# Patient Record
Sex: Female | Born: 1975 | Race: White | Hispanic: No | Marital: Single | Smoking: Current every day smoker
Health system: Southern US, Community
[De-identification: ages and names within clinical notes are randomized; demographics above are authoritative.]

## PROBLEM LIST (undated history)

## (undated) DIAGNOSIS — Z720 Tobacco use: Secondary | ICD-10-CM

## (undated) DIAGNOSIS — IMO0002 Reserved for concepts with insufficient information to code with codable children: Secondary | ICD-10-CM

## (undated) DIAGNOSIS — F32A Depression, unspecified: Secondary | ICD-10-CM

## (undated) DIAGNOSIS — K750 Abscess of liver: Secondary | ICD-10-CM

## (undated) DIAGNOSIS — I429 Cardiomyopathy, unspecified: Secondary | ICD-10-CM

## (undated) DIAGNOSIS — I10 Essential (primary) hypertension: Secondary | ICD-10-CM

## (undated) DIAGNOSIS — D649 Anemia, unspecified: Secondary | ICD-10-CM

## (undated) DIAGNOSIS — I639 Cerebral infarction, unspecified: Secondary | ICD-10-CM

## (undated) DIAGNOSIS — F329 Major depressive disorder, single episode, unspecified: Secondary | ICD-10-CM

## (undated) HISTORY — DX: Cerebral infarction, unspecified: I63.9

## (undated) HISTORY — PX: APPENDECTOMY: SHX54

## (undated) HISTORY — PX: TONSILLECTOMY: SUR1361

## (undated) HISTORY — PX: CRYOTHERAPY: SHX1416

## (undated) HISTORY — DX: Tobacco use: Z72.0

---
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
## 2011-08-19 ENCOUNTER — Encounter (HOSPITAL_COMMUNITY): Payer: Self-pay

## 2011-08-19 ENCOUNTER — Inpatient Hospital Stay (HOSPITAL_COMMUNITY): Payer: Medicaid Other

## 2011-08-19 ENCOUNTER — Inpatient Hospital Stay (HOSPITAL_COMMUNITY)
Admission: AD | Admit: 2011-08-19 | Discharge: 2011-08-28 | DRG: 775 | Disposition: A | Discharge status: Home / Self Care | Payer: Medicaid Other | Source: Ambulatory Visit | Attending: Obstetrics & Gynecology | Admitting: Obstetrics & Gynecology

## 2011-08-19 DIAGNOSIS — O4690 Antepartum hemorrhage, unspecified, unspecified trimester: Secondary | ICD-10-CM

## 2011-08-19 DIAGNOSIS — O2390 Unspecified genitourinary tract infection in pregnancy, unspecified trimester: Principal | ICD-10-CM | POA: Diagnosis present

## 2011-08-19 DIAGNOSIS — O99814 Abnormal glucose complicating childbirth: Secondary | ICD-10-CM | POA: Diagnosis present

## 2011-08-19 DIAGNOSIS — K750 Abscess of liver: Secondary | ICD-10-CM | POA: Diagnosis present

## 2011-08-19 DIAGNOSIS — E119 Type 2 diabetes mellitus without complications: Secondary | ICD-10-CM | POA: Diagnosis present

## 2011-08-19 DIAGNOSIS — N12 Tubulo-interstitial nephritis, not specified as acute or chronic: Secondary | ICD-10-CM

## 2011-08-19 DIAGNOSIS — O2300 Infections of kidney in pregnancy, unspecified trimester: Secondary | ICD-10-CM | POA: Diagnosis present

## 2011-08-19 DIAGNOSIS — D279 Benign neoplasm of unspecified ovary: Secondary | ICD-10-CM | POA: Diagnosis present

## 2011-08-19 DIAGNOSIS — O34599 Maternal care for other abnormalities of gravid uterus, unspecified trimester: Secondary | ICD-10-CM | POA: Diagnosis present

## 2011-08-19 DIAGNOSIS — N10 Acute tubulo-interstitial nephritis: Secondary | ICD-10-CM | POA: Diagnosis present

## 2011-08-19 DIAGNOSIS — O99892 Other specified diseases and conditions complicating childbirth: Secondary | ICD-10-CM | POA: Diagnosis present

## 2011-08-19 HISTORY — DX: Anemia, unspecified: D64.9

## 2011-08-19 HISTORY — DX: Major depressive disorder, single episode, unspecified: F32.9

## 2011-08-19 HISTORY — DX: Depression, unspecified: F32.A

## 2011-08-19 HISTORY — DX: Reserved for concepts with insufficient information to code with codable children: IMO0002

## 2011-08-19 LAB — URINALYSIS, ROUTINE W REFLEX MICROSCOPIC
Glucose, UA: 100 mg/dL — AB
Ketones, ur: 40 mg/dL — AB
Nitrite: POSITIVE — AB
Protein, ur: 30 mg/dL — AB
pH: 6 (ref 5.0–8.0)

## 2011-08-19 LAB — GLUCOSE, CAPILLARY

## 2011-08-19 LAB — COMPREHENSIVE METABOLIC PANEL
ALT: 8 U/L (ref 0–35)
AST: 9 U/L (ref 0–37)
Albumin: 2.4 g/dL — ABNORMAL LOW (ref 3.5–5.2)
Alkaline Phosphatase: 127 U/L — ABNORMAL HIGH (ref 39–117)
BUN: 4 mg/dL — ABNORMAL LOW (ref 6–23)
Chloride: 99 mEq/L (ref 96–112)
Potassium: 3.5 mEq/L (ref 3.5–5.1)
Sodium: 131 mEq/L — ABNORMAL LOW (ref 135–145)
Total Bilirubin: 0.7 mg/dL (ref 0.3–1.2)
Total Protein: 6 g/dL (ref 6.0–8.3)

## 2011-08-19 LAB — CBC
Hemoglobin: 11.7 g/dL — ABNORMAL LOW (ref 12.0–15.0)
MCH: 28.1 pg (ref 26.0–34.0)
MCHC: 33.7 g/dL (ref 30.0–36.0)
MCV: 83.4 fL (ref 78.0–100.0)
RBC: 4.16 MIL/uL (ref 3.87–5.11)

## 2011-08-19 LAB — WET PREP, GENITAL
Clue Cells Wet Prep HPF POC: NONE SEEN
Trich, Wet Prep: NONE SEEN

## 2011-08-19 LAB — URINE MICROSCOPIC-ADD ON

## 2011-08-19 MED ORDER — ZOLPIDEM TARTRATE 10 MG PO TABS: 10.0000 mg | ORAL_TABLET | Freq: Every evening | ORAL | Status: DC | PRN

## 2011-08-19 MED ORDER — SODIUM CHLORIDE 0.9 % IV SOLN
INTRAVENOUS | Status: DC
  Administered 2011-08-19 – 2011-08-21 (×7): via INTRAVENOUS
  Administered 2011-08-22: 125 mL via INTRAVENOUS
  Administered 2011-08-22 (×2): via INTRAVENOUS

## 2011-08-19 MED ORDER — CALCIUM CARBONATE ANTACID 500 MG PO CHEW
2.0000 | CHEWABLE_TABLET | ORAL | Status: DC | PRN
  Administered 2011-08-21 (×3): 400 mg via ORAL
  Filled 2011-08-19 (×2): qty 1
  Filled 2011-08-19 (×2): qty 2

## 2011-08-19 MED ORDER — GLYBURIDE 5 MG PO TABS: 5.0000 mg | ORAL_TABLET | Freq: Every day | ORAL | Status: DC

## 2011-08-19 MED ORDER — DEXTROSE 5 % IV SOLN
1.0000 g | Freq: Two times a day (BID) | INTRAVENOUS | Status: DC
  Administered 2011-08-19 – 2011-08-24 (×10): 1 g via INTRAVENOUS
  Filled 2011-08-19 (×11): qty 1

## 2011-08-19 MED ORDER — GLYBURIDE 2.5 MG PO TABS
2.5000 mg | ORAL_TABLET | Freq: Two times a day (BID) | ORAL | Status: DC
  Administered 2011-08-19: 2.5 mg via ORAL
  Filled 2011-08-19 (×2): qty 1

## 2011-08-19 MED ORDER — ACETAMINOPHEN 325 MG PO TABS: 650.0000 mg | ORAL_TABLET | ORAL | Status: DC | PRN

## 2011-08-19 MED ORDER — SODIUM CHLORIDE 0.9 % IV SOLN
Freq: Once | INTRAVENOUS | Status: AC
  Administered 2011-08-19: 12:00:00 via INTRAVENOUS

## 2011-08-19 MED ORDER — DOCUSATE SODIUM 100 MG PO CAPS
100.0000 mg | ORAL_CAPSULE | Freq: Every day | ORAL | Status: DC
  Administered 2011-08-21 – 2011-08-28 (×7): 100 mg via ORAL
  Filled 2011-08-19 (×7): qty 1

## 2011-08-19 MED ORDER — PRENATAL PLUS 27-1 MG PO TABS
1.0000 | ORAL_TABLET | Freq: Every day | ORAL | Status: DC
  Administered 2011-08-21 – 2011-08-25 (×3): 1 via ORAL
  Filled 2011-08-19 (×4): qty 1

## 2011-08-19 MED ORDER — NIFEDIPINE 10 MG PO CAPS
20.0000 mg | ORAL_CAPSULE | Freq: Once | ORAL | Status: AC
  Administered 2011-08-19: 20 mg via ORAL
  Filled 2011-08-19: qty 2

## 2011-08-19 NOTE — Progress Notes (Signed)
  Having irregular and mild contractions q 4-8 minutes.  CX LTC, pp not palpable.  Most likely due to pyelonephritis, but will treat with a dose of procardia.

## 2011-08-19 NOTE — ED Provider Notes (Signed)
History     Chief Complaint  Patient presents with  . Emesis   Patient is a 35 y.o. female presenting with vomiting. The history is provided by the patient and the spouse.  Emesis  This is a new problem. The current episode started more than 2 days ago. The problem has been gradually worsening. The emesis has an appearance of stomach contents. Associated symptoms include chills and a fever. Pertinent negatives include no abdominal pain, no cough, no diarrhea, no headaches and no myalgias.    Past Medical History  Diagnosis Date  . Diabetes mellitus   . Anemia   . Depression   . Migraine   . Abnormal Pap smear     Past Surgical History  Procedure Date  . Tonsillectomy   . Cryotherapy     No family history on file.  History  Substance Use Topics  . Smoking status: Current Everyday Smoker -- 1.0 packs/day  . Smokeless tobacco: Not on file  . Alcohol Use: No    Allergies: No Known Allergies  Prescriptions prior to admission  Medication Sig Dispense Refill  . glyBURIDE (DIABETA) 2.5 MG tablet Take 5 mg by mouth daily with breakfast.        . prenatal vitamin w/FE, FA (PRENATAL 1 + 1) 27-1 MG TABS Take 1 tablet by mouth daily.          Review of Systems  Constitutional: Positive for fever, chills, malaise/fatigue and diaphoresis.  Eyes: Negative for blurred vision and double vision.  Respiratory: Negative for cough and wheezing.   Cardiovascular: Negative for chest pain.  Gastrointestinal: Positive for nausea and vomiting. Negative for heartburn, abdominal pain and diarrhea.  Genitourinary: Negative for dysuria, urgency and frequency.       Thinks she might have some incontinence  Musculoskeletal: Negative for myalgias.  Skin: Negative for rash.  Neurological: Negative for dizziness, tingling and headaches.  Psychiatric/Behavioral: Negative.    Physical Exam   Blood pressure 138/69, pulse 131, temperature 99.5 F (37.5 C), temperature source Oral, resp. rate 18,  height 5\' 6"  (1.676 m), weight 233 lb 12.8 oz (106.051 kg), SpO2 100.00%.  Physical Exam  Constitutional: She is oriented to person, place, and time. She appears well-developed and well-nourished. She appears ill.  HENT:  Head: Normocephalic.  Mouth/Throat: Oropharynx is clear and moist.       Sweating, facial hair noted  Eyes: Pupils are equal, round, and reactive to light.  Neck: Normal range of motion.  Cardiovascular: Normal heart sounds.  Tachycardia present.   No murmur heard. Respiratory: Effort normal and breath sounds normal. No respiratory distress.  GI: Soft. Bowel sounds are normal. There is no tenderness. There is no rebound and no guarding.  Genitourinary: Vaginal discharge found.  Musculoskeletal: Normal range of motion.  Neurological: She is alert and oriented to person, place, and time. No cranial nerve deficit.  Skin: She is diaphoretic.       Skin hot to the touch and moist with sweat/diaphoresis  Psychiatric: She has a normal mood and affect.    MAU Course  Procedures [place]: Does not show oligo  A/P: Pyleo, will admit. See H&P   Addasyn Mcbreen N 08/19/2011, 12:44 PM

## 2011-08-19 NOTE — Progress Notes (Signed)
Pt states she has been receiving prenatal care in [place] and has recently moved to [facility]. [facility]. Pt states she has had nausea and vomiting for 4 days, getting worse and not keeping anything down. Has had headaches and stiff neck, chills and fever like symptoms. No leaking, bleeding or contractions. Fetal movement has slowed down but continues to move.

## 2011-08-19 NOTE — H&P (Signed)
Maternal Medical History:  Reason for admission: Patient is 34-yo [id number] who presents at 33.[redacted] wks EGA with 3-day history of fevers and chills, and 4 day history of nausea and vomiting. She states she received her [facility] at a clinic in [place], and has recently moved to [place]. She has an appointment next week to be seen in [facility]. She has Type 2 DM, which is diet controlled but since pregnancy has been placed on Glyburide 2.5mg  BID. She states she does not have any dysuria but is having some feeling of incontinence. She denies any vaginal bleeding or contractions, denies back/flank pain, and reports good fetal movement. During this pregnancy, she has had some vaginal bleeding which stopped over one month ago and besides GDM/B she has had no complications. Spouse has [place] from early in July that read positive for oligiohydramnios and she was supposed to have repeat on Aug 24 but was moving to [place] at that time.   Emesis  This is a new problem. The current episode started more than 2 days ago. The problem has been gradually worsening. The emesis has an appearance of stomach contents. Associated symptoms include chills and a fever. Pertinent negatives include no abdominal pain, no cough, no diarrhea, no headaches and no myalgias.   History of 3 term vaginal deliveries.  Past Medical History   Diagnosis  Date   .  Diabetes mellitus    .  Anemia    .  Depression    .  Migraine    .  Abnormal Pap smear     Past Surgical History   Procedure  Date   .  Tonsillectomy    .  Cryotherapy     No family history on file.  History   Substance Use Topics   .  Smoking status:  Current Everyday Smoker -- 1.0 packs/day   .  Smokeless tobacco:  Not on file   .  Alcohol Use:  No    Allergies: No Known Allergies  Prescriptions prior to admission   Medication  Sig  Dispense  Refill   .  glyBURIDE (DIABETA) 2.5 MG tablet  Take 5 mg by mouth daily with breakfast.     .  prenatal vitamin w/FE, FA (PRENATAL 1 + 1)  27-1 MG TABS  Take 1 tablet by mouth daily.      Review of Systems  Constitutional: Positive for fever, chills, malaise/fatigue and diaphoresis.  Eyes: Negative for blurred vision and double vision.  Respiratory: Negative for cough and wheezing.  Cardiovascular: Negative for chest pain.  Gastrointestinal: Positive for nausea and vomiting. Negative for heartburn, abdominal pain and diarrhea.  Genitourinary: Negative for dysuria, urgency and frequency.  Thinks she might have some incontinence  Musculoskeletal: Negative for myalgias.  Skin: Negative for rash.  Neurological: Negative for dizziness, tingling and headaches.  Psychiatric/Behavioral: Negative.   Physical Exam   Blood pressure 138/69, pulse 131, temperature 99.5 F (37.5 C), temperature source Oral, resp. rate 18, height 5\' 6"  (1.676 m), weight 233 lb 12.8 oz (106.051 kg), SpO2 100.00%.  Physical Exam  Constitutional: She is oriented to person, place, and time. She appears well-developed and well-nourished. She appears ill.  HENT:  Head: Normocephalic.  Mouth/Throat: Oropharynx is clear and moist.  Sweating, facial hair noted  Eyes: Pupils are equal, round, and reactive to light.  Neck: Normal range of motion.  Cardiovascular: Normal heart sounds. Tachycardia present.  No murmur heard.  Respiratory: Effort normal and breath sounds normal. No respiratory distress.  GI: Soft. Bowel sounds are normal. There is no tenderness. There is no rebound and no guarding.  Genitourinary: Vaginal discharge found.  Musculoskeletal: Normal range of motion.  Neurological: She is alert and oriented to person, place, and time. No cranial nerve deficit.  Skin: She is diaphoretic.  Skin hot to the touch and moist with sweat/diaphoresis  Psychiatric: She has a normal mood and affect.   MAU Course   Results for orders placed during the hospital encounter of 08/19/11 (from the past 24 hour(s))  URINALYSIS, ROUTINE W REFLEX MICROSCOPIC      Status: Abnormal   Collection Time   08/19/11 11:20 AM      Component Value Range   Color, Urine AMBER (*) YELLOW    Appearance CLEAR  CLEAR    Specific Gravity, Urine 1.020  1.005 - 1.030    pH 6.0  5.0 - 8.0    Glucose, UA 100 (*) NEGATIVE (mg/dL)   Hgb urine dipstick NEGATIVE  NEGATIVE    Bilirubin Urine SMALL (*) NEGATIVE    Ketones, ur 40 (*) NEGATIVE (mg/dL)   Protein, ur 30 (*) NEGATIVE (mg/dL)   Urobilinogen, UA 4.0 (*) 0.0 - 1.0 (mg/dL)   Nitrite POSITIVE (*) NEGATIVE    Leukocytes, UA SMALL (*) NEGATIVE   URINE MICROSCOPIC-ADD ON     Status: Abnormal   Collection Time   08/19/11 11:20 AM      Component Value Range   Squamous Epithelial / LPF FEW (*) RARE    WBC, UA 3-6  <3 (WBC/hpf)   RBC / HPF 0-2  <3 (RBC/hpf)   Bacteria, UA RARE  RARE    Urine-Other MUCOUS PRESENT    CBC     Status: Abnormal   Collection Time   08/19/11 12:15 PM      Component Value Range   WBC 29.2 (*) 4.0 - 10.5 (K/uL)   RBC 4.16  3.87 - 5.11 (MIL/uL)   Hemoglobin 11.7 (*) 12.0 - 15.0 (g/dL)   HCT 40.9 (*) 81.1 - 46.0 (%)   MCV 83.4  78.0 - 100.0 (fL)   MCH 28.1  26.0 - 34.0 (pg)   MCHC 33.7  30.0 - 36.0 (g/dL)   RDW 91.4  78.2 - 95.6 (%)   Platelets 259  150 - 400 (K/uL)  COMPREHENSIVE METABOLIC PANEL     Status: Abnormal   Collection Time   08/19/11 12:15 PM      Component Value Range   Sodium 131 (*) 135 - 145 (mEq/L)   Potassium 3.5  3.5 - 5.1 (mEq/L)   Chloride 99  96 - 112 (mEq/L)   CO2 21  19 - 32 (mEq/L)   Glucose, Bld 138 (*) 70 - 99 (mg/dL)   BUN 4 (*) 6 - 23 (mg/dL)   Creatinine, Ser <2.13 (*) 0.50 - 1.10 (mg/dL)   Calcium 8.9  8.4 - 08.6 (mg/dL)   Total Protein 6.0  6.0 - 8.3 (g/dL)   Albumin 2.4 (*) 3.5 - 5.2 (g/dL)   AST 9  0 - 37 (U/L)   ALT 8  0 - 35 (U/L)   Alkaline Phosphatase 127 (*) 39 - 117 (U/L)   Total Bilirubin 0.7  0.3 - 1.2 (mg/dL)   GFR calc non Af Amer NOT CALCULATED  >60 (mL/min)   GFR calc Af Amer NOT CALCULATED  >60 (mL/min)  WET PREP, GENITAL      Status: Abnormal   Collection Time   08/19/11 12:20 PM  Component Value Range   Yeast, Wet Prep NONE SEEN  NONE SEEN    Trich, Wet Prep NONE SEEN  NONE SEEN    Clue Cells, Wet Prep NONE SEEN  NONE SEEN    WBC, Wet Prep HPF POC MANY (*) NONE SEEN    ssAssessment and Plan      Family History: family history is not on file. Social History:  reports that she has been smoking.  She does not have any smokeless tobacco history on file. She reports that she does not drink alcohol or use illicit drugs.  Review of Systems  Constitutional: Positive for fever and chills.    Dilation: Closed Effacement (%): Thick Exam by:: Dr Natale Milch Blood pressure 138/69, pulse 131, temperature 98.9 F (37.2 C), temperature source Oral, resp. rate 18, height 5\' 6"  (1.676 m), weight 233 lb 12.8 oz (106.051 kg), SpO2 100.00%. Examsee above Physical Exam see above Prenatal labs: ABO, Rh:   Antibody:   Rubella:   RPR:    HBsAg:    HIV:    GBS:     Assessment/Plan: Acute Pyleonephritis 1. Rocephin 1 gram IV q12 2. Tylenol for fevers 3. Continuous Fetal monitoring 4. Repeat CBC in AM  GDM/B 1. Continue glyuburide 2.5mg  BID 2. Monitor blood glucose and treat as appropriate.  Chancy Milroy 08/19/2011, 2:54 PM

## 2011-08-20 ENCOUNTER — Inpatient Hospital Stay (HOSPITAL_COMMUNITY): Payer: Medicaid Other

## 2011-08-20 DIAGNOSIS — O24919 Unspecified diabetes mellitus in pregnancy, unspecified trimester: Secondary | ICD-10-CM

## 2011-08-20 DIAGNOSIS — D279 Benign neoplasm of unspecified ovary: Secondary | ICD-10-CM | POA: Diagnosis present

## 2011-08-20 DIAGNOSIS — O2300 Infections of kidney in pregnancy, unspecified trimester: Secondary | ICD-10-CM | POA: Diagnosis present

## 2011-08-20 DIAGNOSIS — K750 Abscess of liver: Secondary | ICD-10-CM | POA: Diagnosis present

## 2011-08-20 HISTORY — DX: Benign neoplasm of unspecified ovary: D27.9

## 2011-08-20 LAB — GLUCOSE, CAPILLARY
Glucose-Capillary: 136 mg/dL — ABNORMAL HIGH (ref 70–99)
Glucose-Capillary: 83 mg/dL (ref 70–99)

## 2011-08-20 LAB — CBC
MCH: 27.6 pg (ref 26.0–34.0)
MCHC: 32.5 g/dL (ref 30.0–36.0)
Platelets: 267 10*3/uL (ref 150–400)

## 2011-08-20 LAB — GC/CHLAMYDIA PROBE AMP, GENITAL: GC Probe Amp, Genital: NEGATIVE

## 2011-08-20 MED ORDER — NIFEDIPINE 10 MG PO CAPS
20.0000 mg | ORAL_CAPSULE | Freq: Once | ORAL | Status: AC
  Administered 2011-08-20: 20 mg via ORAL
  Filled 2011-08-20: qty 2

## 2011-08-20 MED ORDER — PANTOPRAZOLE SODIUM 40 MG PO TBEC
40.0000 mg | DELAYED_RELEASE_TABLET | ORAL | Status: DC
  Administered 2011-08-20 – 2011-08-28 (×7): 40 mg via ORAL
  Filled 2011-08-20 (×10): qty 1

## 2011-08-20 MED ORDER — ONDANSETRON HCL 4 MG/2ML IJ SOLN
4.0000 mg | Freq: Once | INTRAMUSCULAR | Status: AC
  Administered 2011-08-20: 4 mg via INTRAVENOUS
  Filled 2011-08-20: qty 2

## 2011-08-20 MED ORDER — PROMETHAZINE HCL 25 MG/ML IJ SOLN
25.0000 mg | Freq: Four times a day (QID) | INTRAMUSCULAR | Status: DC | PRN
  Administered 2011-08-20 – 2011-08-21 (×2): 25 mg via INTRAVENOUS
  Filled 2011-08-20 (×4): qty 1

## 2011-08-20 MED ORDER — PANTOPRAZOLE SODIUM 40 MG PO TBEC: 40.0000 mg | DELAYED_RELEASE_TABLET | Freq: Every day | ORAL | Status: DC

## 2011-08-20 MED ORDER — IOHEXOL 300 MG/ML  SOLN
100.0000 mL | Freq: Once | INTRAMUSCULAR | Status: AC | PRN
  Administered 2011-08-20: 100 mL via INTRAVENOUS

## 2011-08-20 MED ORDER — ONDANSETRON HCL 4 MG/2ML IJ SOLN
4.0000 mg | Freq: Four times a day (QID) | INTRAMUSCULAR | Status: DC | PRN
  Administered 2011-08-20 – 2011-08-22 (×5): 4 mg via INTRAVENOUS
  Filled 2011-08-20 (×5): qty 2

## 2011-08-20 MED ORDER — ACETAMINOPHEN 650 MG RE SUPP
650.0000 mg | RECTAL | Status: DC | PRN
  Administered 2011-08-20: 650 mg via RECTAL

## 2011-08-20 MED ORDER — GLYBURIDE 5 MG PO TABS
5.0000 mg | ORAL_TABLET | Freq: Two times a day (BID) | ORAL | Status: DC
  Administered 2011-08-20 – 2011-08-26 (×11): 5 mg via ORAL
  Filled 2011-08-20 (×15): qty 1

## 2011-08-20 NOTE — Progress Notes (Signed)
  Patient c/o fevers and worsening nausea/emesis, poor appetite. +FM, no LOF, no VB, no CTX  VSS Tm 103  PE: Abd: s/gravid/NT Back: no CVA tenderness Ext: NT, equal in size  FHT: baseline 190's ,mod variability, no accels, no decels Toco: rare ctx  A/P 35 yo G5P3013 @ [redacted]w[redacted]d with fever and poor appetite. - will obtain CT to r/o appendicitis - tylenol for fever - cont close monitoring

## 2011-08-20 NOTE — Progress Notes (Addendum)
FACULTY PRACTICE ANTEPARTUM(COMPREHENSIVE) NOTE  [name] is a 35 y.o. [id number] at [redacted]w[redacted]d  who is admitted for pyelonephritis.   Fetal presentation is cephalic. Length of Stay:  1  Days  Subjective: Patient complains of  Patient reports the fetal movement as active. Patient reports uterine contraction  activity as none now, had some last night, got 1 dose of procardia. Patient reports  vaginal bleeding as none. Patient describes fluid per vagina as None.  Vitals:  Blood pressure 127/59, pulse 114, temperature 98.4 F (36.9 C), temperature source Oral, resp. rate 22, height 5\' 6"  (1.676 m), weight 233 lb 12.8 oz (106.051 kg), SpO2 98.00%. Physical Examination:  General appearance - alert, well appearing, and in no distress Heart - normal rate, regular rhythm, normal S1, S2, no murmurs, rubs, clicks or gallops Back exam - minimal Left CVAT Fundal Height:  size equals dates Pelvic Exam:  examination not indicated Cervical Exam: Not evaluated. and found to be not evaluated/ Extremities: extremities normal, atraumatic, no cyanosis or edema with DTRs 2+ bilaterally Membranes:intact  Fetal Monitoring:  Baseline: 150 bpm, Variability: Good {> 6 bpm) and Accelerations: Reactive  Labs:  Recent Results (from the past 24 hour(s))  URINALYSIS, ROUTINE W REFLEX MICROSCOPIC   Collection Time   08/19/11 11:20 AM      Component Value Range   Color, Urine AMBER (*) YELLOW    Appearance CLEAR  CLEAR    Specific Gravity, Urine 1.020  1.005 - 1.030    pH 6.0  5.0 - 8.0    Glucose, UA 100 (*) NEGATIVE (mg/dL)   Hgb urine dipstick NEGATIVE  NEGATIVE    Bilirubin Urine SMALL (*) NEGATIVE    Ketones, ur 40 (*) NEGATIVE (mg/dL)   Protein, ur 30 (*) NEGATIVE (mg/dL)   Urobilinogen, UA 4.0 (*) 0.0 - 1.0 (mg/dL)   Nitrite POSITIVE (*) NEGATIVE    Leukocytes, UA SMALL (*) NEGATIVE   URINE MICROSCOPIC-ADD ON   Collection Time   08/19/11 11:20 AM      Component Value Range   Squamous Epithelial /  LPF FEW (*) RARE    WBC, UA 3-6  <3 (WBC/hpf)   RBC / HPF 0-2  <3 (RBC/hpf)   Bacteria, UA RARE  RARE    Urine-Other MUCOUS PRESENT    CBC   Collection Time   08/19/11 12:15 PM      Component Value Range   WBC 29.2 (*) 4.0 - 10.5 (K/uL)   RBC 4.16  3.87 - 5.11 (MIL/uL)   Hemoglobin 11.7 (*) 12.0 - 15.0 (g/dL)   HCT 62.1 (*) 30.8 - 46.0 (%)   MCV 83.4  78.0 - 100.0 (fL)   MCH 28.1  26.0 - 34.0 (pg)   MCHC 33.7  30.0 - 36.0 (g/dL)   RDW 65.7  84.6 - 96.2 (%)   Platelets 259  150 - 400 (K/uL)  COMPREHENSIVE METABOLIC PANEL   Collection Time   08/19/11 12:15 PM      Component Value Range   Sodium 131 (*) 135 - 145 (mEq/L)   Potassium 3.5  3.5 - 5.1 (mEq/L)   Chloride 99  96 - 112 (mEq/L)   CO2 21  19 - 32 (mEq/L)   Glucose, Bld 138 (*) 70 - 99 (mg/dL)   BUN 4 (*) 6 - 23 (mg/dL)   Creatinine, Ser <9.52 (*) 0.50 - 1.10 (mg/dL)   Calcium 8.9  8.4 - 84.1 (mg/dL)   Total Protein 6.0  6.0 - 8.3 (g/dL)  Albumin 2.4 (*) 3.5 - 5.2 (g/dL)   AST 9  0 - 37 (U/L)   ALT 8  0 - 35 (U/L)   Alkaline Phosphatase 127 (*) 39 - 117 (U/L)   Total Bilirubin 0.7  0.3 - 1.2 (mg/dL)   GFR calc non Af Amer NOT CALCULATED  >60 (mL/min)   GFR calc Af Amer NOT CALCULATED  >60 (mL/min)  GC/CHLAMYDIA PROBE AMP, GENITAL   Collection Time   08/19/11 12:20 PM      Component Value Range   GC Probe Amp, Genital NEGATIVE  NEGATIVE    Chlamydia, DNA Probe NEGATIVE  NEGATIVE   WET PREP, GENITAL   Collection Time   08/19/11 12:20 PM      Component Value Range   Yeast, Wet Prep NONE SEEN  NONE SEEN    Trich, Wet Prep NONE SEEN  NONE SEEN    Clue Cells, Wet Prep NONE SEEN  NONE SEEN    WBC, Wet Prep HPF POC MANY (*) NONE SEEN   GLUCOSE, CAPILLARY   Collection Time   08/19/11  7:27 PM      Component Value Range   Glucose-Capillary 117 (*) 70 - 99 (mg/dL)   Comment 1 Notify RN     Comment 2 Documented in Chart    CBC   Collection Time   08/20/11  5:15 AM      Component Value Range   WBC 26.4 (*) 4.0 - 10.5  (K/uL)   RBC 3.84 (*) 3.87 - 5.11 (MIL/uL)   Hemoglobin 10.6 (*) 12.0 - 15.0 (g/dL)   HCT 16.1 (*) 09.6 - 46.0 (%)   MCV 84.9  78.0 - 100.0 (fL)   MCH 27.6  26.0 - 34.0 (pg)   MCHC 32.5  30.0 - 36.0 (g/dL)   RDW 04.5  40.9 - 81.1 (%)   Platelets 267  150 - 400 (K/uL)  GLUCOSE, CAPILLARY   Collection Time   08/20/11  7:19 AM      Component Value Range   Glucose-Capillary 136 (*) 70 - 99 (mg/dL)    Imaging Studies  :      Medications:  Scheduled    . sodium chloride   Intravenous Once  . cefTRIAXone (ROCEPHIN) IV  1 g Intravenous Q12H  . docusate sodium  100 mg Oral Daily  . glyBURIDE  2.5 mg Oral BID AC  . NIFEdipine  20 mg Oral Once  . ondansetron  4 mg Intravenous Once  . prenatal vitamin w/FE, FA  1 tablet Oral Daily  . DISCONTD: glyBURIDE  5 mg Oral Q breakfast   I have reviewed the patient's current medications.  ASSESSMENT: 1.  Pyelonephritis @ 33 weeks 5 days, responding to rocephin 2.  Class B diabetes, sub optimal control  PLAN: Continue Rocephin.  Increase Glyburide to 5 mg po BID.  Begin Prilosec for reflux.  EURE,LUTHER H 08/20/2011,7:24 AM

## 2011-08-21 ENCOUNTER — Inpatient Hospital Stay (HOSPITAL_COMMUNITY): Payer: Medicaid Other

## 2011-08-21 ENCOUNTER — Other Ambulatory Visit: Payer: Self-pay | Admitting: Internal Medicine

## 2011-08-21 DIAGNOSIS — K750 Abscess of liver: Secondary | ICD-10-CM

## 2011-08-21 LAB — URINE CULTURE: Colony Count: 70000

## 2011-08-21 LAB — GLUCOSE, CAPILLARY
Glucose-Capillary: 119 mg/dL — ABNORMAL HIGH (ref 70–99)
Glucose-Capillary: 71 mg/dL (ref 70–99)
Glucose-Capillary: 86 mg/dL (ref 70–99)
Glucose-Capillary: 91 mg/dL (ref 70–99)
Glucose-Capillary: 98 mg/dL (ref 70–99)

## 2011-08-21 LAB — RAPID URINE DRUG SCREEN, HOSP PERFORMED
Cocaine: NOT DETECTED
Opiates: NOT DETECTED
Tetrahydrocannabinol: NOT DETECTED

## 2011-08-21 LAB — CBC
HCT: 35.4 % — ABNORMAL LOW (ref 36.0–46.0)
Hemoglobin: 11.6 g/dL — ABNORMAL LOW (ref 12.0–15.0)
RDW: 14.7 % (ref 11.5–15.5)
WBC: 25.9 10*3/uL — ABNORMAL HIGH (ref 4.0–10.5)

## 2011-08-21 MED ORDER — POLYETHYLENE GLYCOL 3350 17 G PO PACK
17.0000 g | PACK | Freq: Every day | ORAL | Status: DC | PRN
  Administered 2011-08-22: 17 g via ORAL
  Filled 2011-08-21: qty 1

## 2011-08-21 MED ORDER — BISACODYL 10 MG RE SUPP
10.0000 mg | Freq: Every day | RECTAL | Status: DC | PRN
  Filled 2011-08-21: qty 1

## 2011-08-21 MED ORDER — NIFEDIPINE 10 MG PO CAPS
20.0000 mg | ORAL_CAPSULE | Freq: Once | ORAL | Status: AC
  Administered 2011-08-21: 20 mg via ORAL
  Filled 2011-08-21: qty 2

## 2011-08-21 MED ORDER — MORPHINE SULFATE 4 MG/ML IJ SOLN
2.0000 mg | Freq: Once | INTRAMUSCULAR | Status: AC
  Administered 2011-08-21: 2 mg via INTRAVENOUS
  Filled 2011-08-21: qty 1

## 2011-08-21 MED ORDER — ACETAMINOPHEN 500 MG PO TABS
1000.0000 mg | ORAL_TABLET | Freq: Four times a day (QID) | ORAL | Status: DC | PRN
  Administered 2011-08-21 – 2011-08-25 (×4): 1000 mg via ORAL
  Filled 2011-08-21 (×3): qty 2

## 2011-08-21 NOTE — Progress Notes (Signed)
Admitted at  [redacted] weeks gestation, with nausea, hepatic abscess.  Height  66" Weight 233 lbs  pre-pregnancy weight 260 lbs.Pre-pregnancy  BMI 42/ morbid obesity  IBW 130 lbs  Total weight gain none, pt with a 27 lb weight loss. Weight gain goals11-20 lbs.   Estimated needs: 22-2400 kcal/day, 81-91 grams protein/day, 2.6 liters fluid/day  Carbohydrate modified gestational  diet  With improving tolerance, less nausea, appetite poor. Pt. Relays Hx of morning sickness and weight loss early in pregnancy, and recent hx of N/V, continued weight loss. Pt is with a 10 % loss of usual weight, putting her at nutrition risk  CBG's 98-119, on Diabeta   Nutrition Dx: Increased nutrient needs r/t pregnancy, Hx weight loss  and fetal growth requirements aeb [redacted] weeks gestation and 27 lb weight loss  Pt has not had education on gestational diabetic diet. Has had education on Diabetic diet for DM II. Briefly explained differences between diets and gave pt a copy of "My food plan for gestational diabetes", as well as handout on snacks.

## 2011-08-21 NOTE — Progress Notes (Signed)
S: Called to evaluate patient b/c she reported feeling not well suddenly, and then developed sharp lower RT flank/side pain. She denied feeling nauseated at that time. Reports last BM several days ago, received first dose of colace today. Has had continued decrease in appetite. Has not been seen by ID yet.  O: Filed Vitals:   08/21/11 1527  BP:   Pulse:   Temp: 99.5 F (37.5 C)  Resp:    CBG: 91  General: Anxious appearing, breathing held then hard Heart: Regular, no tacycardia, no murmur Lungs: CTA all fields anterior and posteriorly Abd: +BS, no contractions palpated. Point tenderness along lower 2-3 ribs along the axillary angle Fetal Heart Tracing: 150-155, moderate variability, +Accels, no decels, no contractions seen on toco  A/p 1. Rt Pain: ? Pyelo vs liver abnormality seen. May also be related to location of fetal/toco straps. Will d/c strap x 30 minutes. Try therapy with IV morphine for pain control. Do not feel that related to lungs b/c pulse ox 100%, lung fields all clear, and no tachycardia; point tenderness along rib angle 2. Febrile illness: awaiting ID eval/input at this time.

## 2011-08-21 NOTE — Consult Note (Addendum)
Reason: liver mass  HPI: 35 yo female with type 2 diabetes here with fever, n/v and chills about 3 days prior to presentation.  She just recently moved back to [place] from [place].  She also today is having some right flank pain that she previously didn't have.  She is 33 weeks G5P3.  Work up revealed + nitrites on UA but just a few WBCs and few bacteria.  CT scan was done to evaluate and noted findings concerning for pyelonephritis and incidentally a region of the right hepatic lobe concerning for abscess.  Ultrasound was then done and actually suggests it is of benign nature and not an abscess.  She continues to have fever and chills but WBC has improved modestly.  No history of any liver problems.    BMW:[id number], anemia, depression, migraine, tonsillectomy  Medications: Rocephin  Allergies NKDA  SH: she denies travel outside of [place], has only been to [place] recently and no other local travel.  No well water.   FH: no liver disease  ROS: 12 point ROS negative except as per the HPI  Tm 103.1, pulse 109, BP 107/62 Gen - AAO x 3, mildly anxious with pain HEENT - anicteric CV RRR Lungs CTA ABD gravid, nt, +BS EXT no C/C/E Labs: reviewed, WBC improving, now 25.9, creat < 0.47 CT with ?abscess, U/S with ? Hemangioma  A/P  Pyelonephritis - diagnosis of pyelo on CT scan, urine culture unhelpful, no positive blood cultures.  Continue with the Rocephin.   Liver mass - this is unclear.  On ultrasound, it suggests that it is benign but there is no previous scans to compare to.  She tells me she has not had any ultrasounds or CTs in [place].  An MRI would help determine the mass, if possible with her pregnancy.  If it is an abscess, then it would need to be drained.   If unable to do MRI, she may need to continue with the Rocephin and rescan with a new ultrasound in a few days to see if the liver mass is changing (suggesting infection) or not. Will continue to follow

## 2011-08-21 NOTE — Progress Notes (Signed)
Less nausea this AM, no current F&C, good FM  Filed Vitals:   08/21/11 0500 08/21/11 0527 08/21/11 0548 08/21/11 0638  BP:      Pulse:      Temp:      TempSrc:      Resp: 18 22 22 18   Height:      Weight:      SpO2:       NAD, pleasant, tired  Abd NT, gravid, soft Ext no edema  *RADIOLOGY REPORT*  Clinical Data: Right lower quadrant pain, fever. Concern for  appendicitis. [redacted] weeks pregnant. Consent form signed after the  patient discussed the risks and benefits of the examination with  Dr. Kyung Rudd.  CT ABDOMEN AND PELVIS WITH CONTRAST  Technique: Multidetector CT imaging of the abdomen and pelvis was  performed following the standard protocol during bolus  administration of intravenous contrast.  Contrast: 100 ml Omnipaque 300 IV contrast  Comparison: Renal ultrasound 08/19/2011, OB ultrasound 08/19/2011  Findings: Minimal dependent bibasilar atelectasis is noted at the  lung bases.  The appendix measures 7 mm, with gas is visualized within its tip.  No periappendiceal stranding is seen. Again noted is an 8.2 x 5.5  cm mixed density right adnexal mass, with element of internal fat,  compatible with a dermoid.  There is sludge within the gallbladder but no gallbladder  distention, surrounding fluid, stranding or evidence for wall  thickening is identified. In the posterior segment right hepatic  lobe is a 6.7 x 5.7 cm ill-defined hypodense lesion, image 22. No  intrahepatic ductal dilatation is identified. No other hepatic  abnormality is seen. 1.8 cm right mid renal cortical hypodense  lesion measuring 22 HU is identified on image 47. There is mild  fullness of the renal collecting systems bilaterally but within  normal limits for the patient's gravid state. No radiopaque renal  or ureteral calculus.  The spleen, adrenal glands, and pancreas are normal. Common duct  is normal in caliber. No abdominal ascites or lymphadenopathy.  Colon and small bowel are normal. Single  intrauterine gestation is  identified. No gross uterine abnormality is identified, although  the examination was not performed for detection of fetal anatomy or  placental complication, for example. The bladder is unremarkable  for degree of collapse. No acute osseous finding.  IMPRESSION:  Ill-defined posterior segment right hepatic lobe lesion, for which  primary differential considerations include abscess, laceration if  there is a history of trauma, less likely hemangioma or other  benign mass lesion. There is no obvious gastrointestinal  abnormality identified apart from gallstones or sludge without  other evidence for cholecystitis to account for this appearance.  The appendix is normal.  Ill-defined right mid renal cortical hypodense lesion which  measures higher density than that expected for fluid within a cyst.  This is new since the prior exam and could represent focal  pyelonephritis in the setting of urinary tract infection, with  ascending infection. This could also potentially explain the  finding of a hepatic abscess if there has been hematogenous spread  of infection.  Abdominal ultrasound could be considered for further  characterization of the hepatic mass.  These results were called by telephone on 08/20/2011 at 8:30 p.m.  to Dr. Debroah Loop, who verbally acknowledged these results.  Original Report Authenticated By: Harrel Lemon, M.D.        Results for orders placed during the hospital encounter of 08/19/11 (from the past 24 hour(s))  GLUCOSE, CAPILLARY  Status: Abnormal   Collection Time   08/20/11  7:19 AM      Component Value Range   Glucose-Capillary 136 (*) 70 - 99 (mg/dL)  GLUCOSE, CAPILLARY     Status: Abnormal   Collection Time   08/20/11 10:45 AM      Component Value Range   Glucose-Capillary 111 (*) 70 - 99 (mg/dL)   Comment 1 Documented in Chart     Comment 2 Notify RN    GLUCOSE, CAPILLARY     Status: Normal   Collection Time   08/20/11   5:09 PM      Component Value Range   Glucose-Capillary 83  70 - 99 (mg/dL)   Comment 1 Documented in Chart     Comment 2 Notify RN    GLUCOSE, CAPILLARY     Status: Normal   Collection Time   08/20/11 11:59 PM      Component Value Range   Glucose-Capillary 98  70 - 99 (mg/dL)  CBC     Status: Abnormal   Collection Time   08/21/11  5:10 AM      Component Value Range   WBC 25.9 (*) 4.0 - 10.5 (K/uL)   RBC 4.13  3.87 - 5.11 (MIL/uL)   Hemoglobin 11.6 (*) 12.0 - 15.0 (g/dL)   HCT 11.9 (*) 14.7 - 46.0 (%)   MCV 85.7  78.0 - 100.0 (fL)   MCH 28.1  26.0 - 34.0 (pg)   MCHC 32.8  30.0 - 36.0 (g/dL)   RDW 82.9  56.2 - 13.0 (%)   Platelets 305  150 - 400 (K/uL)  GLUCOSE, CAPILLARY     Status: Abnormal   Collection Time   08/21/11  6:10 AM      Component Value Range   Glucose-Capillary 119 (*) 70 - 99 (mg/dL)   Imp [redacted]w[redacted]d Treated for possible pyelonephritis, with mixed species in urine. Focal lesion in kidney and liver, for F/U US  Plan [place] live, consider ABX change, may need ID consult

## 2011-08-21 NOTE — Plan of Care (Signed)
Problem: Consults Goal: Birthing Suites Patient Information Press F2 to bring up selections list  Outcome: Not Applicable Date Met:  08/21/11  Pt < [redacted] weeks EGA

## 2011-08-22 ENCOUNTER — Inpatient Hospital Stay (HOSPITAL_COMMUNITY): Payer: Medicaid Other

## 2011-08-22 DIAGNOSIS — O4690 Antepartum hemorrhage, unspecified, unspecified trimester: Secondary | ICD-10-CM

## 2011-08-22 DIAGNOSIS — E119 Type 2 diabetes mellitus without complications: Secondary | ICD-10-CM

## 2011-08-22 HISTORY — DX: Type 2 diabetes mellitus without complications: E11.9

## 2011-08-22 LAB — GLUCOSE, CAPILLARY
Glucose-Capillary: 74 mg/dL (ref 70–99)
Glucose-Capillary: 60 mg/dL — ABNORMAL LOW (ref 70–99)
Glucose-Capillary: 67 mg/dL — ABNORMAL LOW (ref 70–99)
Glucose-Capillary: 104 mg/dL — ABNORMAL HIGH (ref 70–99)

## 2011-08-22 LAB — CBC
HCT: 33.2 % — ABNORMAL LOW (ref 36.0–46.0)
Hemoglobin: 11.1 g/dL — ABNORMAL LOW (ref 12.0–15.0)
MCH: 28.2 pg (ref 26.0–34.0)
MCV: 84.5 fL (ref 78.0–100.0)
Platelets: 318 10*3/uL (ref 150–400)
RBC: 3.93 MIL/uL (ref 3.87–5.11)
WBC: 25.2 10*3/uL — ABNORMAL HIGH (ref 4.0–10.5)

## 2011-08-22 MED ORDER — ONDANSETRON 4 MG PO TBDP
4.0000 mg | ORAL_TABLET | Freq: Four times a day (QID) | ORAL | Status: DC | PRN
  Filled 2011-08-22: qty 1

## 2011-08-22 MED ORDER — SODIUM CHLORIDE 0.9 % IV SOLN
INTRAVENOUS | Status: DC
  Administered 2011-08-23 – 2011-08-25 (×7): via INTRAVENOUS
  Administered 2011-08-26: 125 mL via INTRAVENOUS

## 2011-08-22 NOTE — Progress Notes (Signed)
Pt stated that she has not had a bowel movement in about 2 weeks.  Pt said that RN gave her a stool softener yesterday.  Plans to take another stool softener tonight

## 2011-08-22 NOTE — Progress Notes (Signed)
FACULTY PRACTICE ANTEPARTUM(COMPREHENSIVE) NOTE  [name] is a 35 y.o. [id number] at [redacted]w[redacted]d  who is admitted for presumed pyelonephritis; but found to have an 8 cm liver abscess which was confirmed on MRI today.  Patient also has an asymptomatic 9 cm right ovarian dermoid cyst seen on ultrasound and CT scan during this admission.  Patient has A2GDM on glyburide. Fetal presentation is cephalic on ultrasound from 08/20/11. Length of Stay:  3  Days  Subjective: Patient reports continued moderate right flank pain.  No N/V or other systemic symptoms. Patient reports the fetal movement as active. Patient reports uterine contraction  activity as none. Patient reports  vaginal bleeding as none. Patient describes fluid per vagina as none.  Vitals:  Blood pressure 128/73, pulse 103, temperature 98.5 F (36.9 C), temperature source Oral, resp. rate 18, height 5\' 6"  (1.676 m), weight 106.051 kg (233 lb 12.8 oz), SpO2 100.00%. Physical Examination: General appearance - alert, well appearing, and in no distress Abdomen - soft, nontender, nondistended, no masses or organomegaly, gravid Back exam - R CVAT Extremities - peripheral pulses normal, no pedal edema, no clubbing or cyanosis Fundal Height -  size equals dates Pelvic Exam -  examination not indicated  Fetal Monitoring:  Baseline: 140 bpm, Variability: moderate, Accelerations: Reactive and Decelerations: Absent  Labs:  Recent Results (from the past 24 hour(s))  GLUCOSE, CAPILLARY   Collection Time   08/21/11  8:28 PM      Component Value Range   Glucose-Capillary 86  70 - 99 (mg/dL)  GLUCOSE, CAPILLARY   Collection Time   08/21/11 10:33 PM      Component Value Range   Glucose-Capillary 71  70 - 99 (mg/dL)  CBC   Collection Time   08/22/11  5:31 AM      Component Value Range   WBC 25.2 (*) 4.0 - 10.5 (K/uL)   RBC 3.93  3.87 - 5.11 (MIL/uL)   Hemoglobin 11.1 (*) 12.0 - 15.0 (g/dL)   HCT 14.7 (*) 82.9 - 46.0 (%)   MCV 84.5  78.0 - 100.0  (fL)   MCH 28.2  26.0 - 34.0 (pg)   MCHC 33.4  30.0 - 36.0 (g/dL)   RDW 56.2  13.0 - 86.5 (%)   Platelets 318  150 - 400 (K/uL)  GLUCOSE, CAPILLARY   Collection Time   08/22/11  6:32 AM      Component Value Range   Glucose-Capillary 98  70 - 99 (mg/dL)  GLUCOSE, CAPILLARY   Collection Time   08/22/11  9:56 AM      Component Value Range   Glucose-Capillary 76  70 - 99 (mg/dL)  GLUCOSE, CAPILLARY   Collection Time   08/22/11  1:41 PM      Component Value Range   Glucose-Capillary 74  70 - 99 (mg/dL)  GLUCOSE, CAPILLARY   Collection Time   08/22/11  5:02 PM      Component Value Range   Glucose-Capillary 60 (*) 70 - 99 (mg/dL)    Imaging Studies:    MRI ABDOMEN WITHOUT CONTRAST  Technique: Multiplanar multisequence MR imaging of the abdomen was performed. No intravenous contrast was administered.  Comparison: CT scan and ultrasound examinations.  Findings: There is a 8.2 x 7.4 x 7.3 cm lesion in the posterior aspect of the right hepatic lobe. This has some areas of increased and decreased T2 signal intensity. Minimal surrounding edema like signal change on the T2-weighted images. Findings most likely secondary to a liver abscess given  the patients fever and elevated white blood cell count. A necrotic liver mass is also possible but much less likely. No other liver lesions are demonstrated. No biliary dilatation. Multiple gallstones are noted in the gallbladder but no gallbladder wall thickening or pericholecystic inflammatory changes. The common bile duct is normal in caliber. The pancreas is normal. The spleen is normal in size. No focal lesions. A small right renal cyst is noted. Minimal hydronephrosis likely due to gravid uterus.  IMPRESSION:  1. MR findings most consistent with a liver abscess. A necrotic hepatic mass is felt to be much less likely.  2. Cholelithiasis.  Original Report Authenticated By: P. Loralie Champagne, M.D.  Medications:  Scheduled    . cefTRIAXone (ROCEPHIN) IV  1  g Intravenous Q12H  . docusate sodium  100 mg Oral Daily  . glyBURIDE  5 mg Oral BID AC  . pantoprazole  40 mg Oral 1 day or 1 dose  . prenatal vitamin w/FE, FA  1 tablet Oral Daily   I have reviewed the patient's current medications.  ASSESSMENT: Patient Active Problem List  Diagnoses  . Pyelonephritis complicating pregnancy  . Liver abscess  . Right ovarian dermoid cyst  . Gestational diabetes mellitus, class A2    PLAN: 1) Presumed pyelonephritis: UA on admission had +nitrites and +LE, patient had a fever (Tmax 103) with WBC of 29K.  She has been treated with Rocephin 1g IV q12h.  However, her urine culture had 70K multiple bacterial morphotypes, and another possible etiology for her fever is her liver abscess.  Will continue Rocephin for now. 2) Liver abscess: Abscess confirmed on MRI.  Dr. Orvan Falconer from Infectious Disease was informed and he recommended drainage by Interventional Radiology (IR).  IR was consulted, spoke with Dr. Richarda Overlie, who agreed to schedule her for ultrasound guided drainage tomorrow morning. She is ordered for NPO after midnight.  The procedure will be at [facility] IR suite, nursing aware that patient needs to be transferred there via [facility]. 3) Right ovarian dermoid cyst: Talked to patient about this diagnosis in detail. No need for acute intervention; patient will need surgery postpartum.  She reports also wanting a BTL; this can be done at the same time. 4) A2GDM: Continue CBG monitoring; Glyburide treatment. Continue routine antenatal care.   ANYANWU,UGONNA A 08/22/2011,5:10 PM

## 2011-08-23 ENCOUNTER — Inpatient Hospital Stay (HOSPITAL_COMMUNITY): Payer: Medicaid Other

## 2011-08-23 LAB — CBC
HCT: 32.9 % — ABNORMAL LOW (ref 36.0–46.0)
MCH: 27.6 pg (ref 26.0–34.0)
MCHC: 33.4 g/dL (ref 30.0–36.0)
MCV: 82.7 fL (ref 78.0–100.0)
Platelets: 325 10*3/uL (ref 150–400)
RDW: 14.8 % (ref 11.5–15.5)

## 2011-08-23 LAB — GLUCOSE, CAPILLARY
Glucose-Capillary: 96 mg/dL (ref 70–99)
Glucose-Capillary: 142 mg/dL — ABNORMAL HIGH (ref 70–99)

## 2011-08-23 LAB — TYPE AND SCREEN: ABO/RH(D): A POS

## 2011-08-23 MED ORDER — BUTORPHANOL TARTRATE 2 MG/ML IJ SOLN
INTRAMUSCULAR | Status: AC
  Administered 2011-08-23: 1 mg via INTRAVENOUS
  Filled 2011-08-23: qty 1

## 2011-08-23 MED ORDER — BUTORPHANOL TARTRATE 2 MG/ML IJ SOLN
1.0000 mg | Freq: Once | INTRAMUSCULAR | Status: AC
  Administered 2011-08-23: 1 mg via INTRAVENOUS

## 2011-08-23 MED ORDER — OXYCODONE-ACETAMINOPHEN 5-325 MG PO TABS
2.0000 | ORAL_TABLET | ORAL | Status: DC | PRN
  Administered 2011-08-23 – 2011-08-25 (×7): 2 via ORAL
  Filled 2011-08-23 (×7): qty 2

## 2011-08-23 MED ORDER — SODIUM CHLORIDE 0.9 % IJ SOLN
10.0000 mL | Freq: Three times a day (TID) | INTRAMUSCULAR | Status: DC
  Administered 2011-08-23 – 2011-08-25 (×8): 10 mL
  Filled 2011-08-23 (×12): qty 10

## 2011-08-23 MED ORDER — BUTORPHANOL TARTRATE 2 MG/ML IJ SOLN: 1.0000 mg | INTRAMUSCULAR | Status: AC | PRN

## 2011-08-23 NOTE — Initial Assessments (Signed)
Pt showered, CHG wipes to abdomen.  [facility] here to transfer pt to [facility].  Pt in stable condition

## 2011-08-23 NOTE — Progress Notes (Signed)
SCDs applied.

## 2011-08-23 NOTE — Progress Notes (Signed)
J/P drain emptied for 30 cc of thick purulent drg

## 2011-08-23 NOTE — Progress Notes (Signed)
Drg from J/P serosanguinous  And thick

## 2011-08-23 NOTE — Progress Notes (Signed)
Pt up to bathroom.  Voided 300 cc clear amber urine.  Pt up in rocking chair after returning from bathroom

## 2011-08-23 NOTE — Progress Notes (Signed)
  Subjective:    Patient ID: [name], female    DOB: 1976/04/08, 35 y.o.   MRN: [id number]  Emesis  There has been no fever.  Ms. Ticer is currently at [facility] for placement of a percutaneous drain in her large liver abscess. She has defervesced.   Review of Systems  Gastrointestinal: Positive for vomiting.       Objective:   Physical Exam        Assessment & Plan:  I will review the gram stain of abscess fluid and make any adjustments in her antibiotic that are indicated. I will continue Rocephin for now and f/u tomorrow.

## 2011-08-23 NOTE — Progress Notes (Signed)
FACULTY PRACTICE ANTEPARTUM(COMPREHENSIVE) NOTE  [name] is a 35 y.o. [id number] at [redacted]w[redacted]d  who is admitted for presumed pyelonephritis; but found to have an 8 cm liver abscess which was confirmed on MRI.  Patient also has an asymptomatic 9 cm right ovarian dermoid cyst seen on ultrasound and CT scan during this admission.  Patient has Class B GDM on glyburide. Fetal presentation is cephalic on ultrasound from 08/20/11. Length of Stay:  4  Days  Subjective: Patient reports continued moderate right flank pain.  No N/V or other systemic symptoms. Patient reports the fetal movement as active. Patient reports uterine contraction  activity as none. Patient reports  vaginal bleeding as none. Patient describes fluid per vagina as none.  Vitals:  Blood pressure 116/63, pulse 100, temperature 98.6 F (37 C), temperature source Oral, resp. rate 20, height 5\' 6"  (1.676 m), weight 106.051 kg (233 lb 12.8 oz), SpO2 100.00%. Physical Examination: General appearance - alert, well appearing, and in no distress Abdomen - soft, nontender, nondistended, no masses or organomegaly, gravid Back exam - Mild R CVAT Extremities - peripheral pulses normal, no pedal edema, no clubbing or cyanosis Fundal Height -  size equals dates Pelvic Exam -  examination not indicated  Fetal Monitoring:  Baseline: 140 bpm, Variability: moderate, Accelerations: Reactive and Decelerations: Absent  Labs:  Recent Results (from the past 24 hour(s))  GLUCOSE, CAPILLARY   Collection Time   08/22/11  9:56 AM      Component Value Range   Glucose-Capillary 76  70 - 99 (mg/dL)  GLUCOSE, CAPILLARY   Collection Time   08/22/11  1:41 PM      Component Value Range   Glucose-Capillary 74  70 - 99 (mg/dL)  GLUCOSE, CAPILLARY   Collection Time   08/22/11  5:02 PM      Component Value Range   Glucose-Capillary 60 (*) 70 - 99 (mg/dL)  GLUCOSE, CAPILLARY   Collection Time   08/22/11  7:51 PM      Component Value Range   Glucose-Capillary  67 (*) 70 - 99 (mg/dL)  GLUCOSE, CAPILLARY   Collection Time   08/22/11 10:05 PM      Component Value Range   Glucose-Capillary 104 (*) 70 - 99 (mg/dL)   Comment 1 Documented in Chart    CBC   Collection Time   08/23/11  5:33 AM      Component Value Range   WBC 29.7 (*) 4.0 - 10.5 (K/uL)   RBC 3.98  3.87 - 5.11 (MIL/uL)   Hemoglobin 11.0 (*) 12.0 - 15.0 (g/dL)   HCT 45.4 (*) 09.8 - 46.0 (%)   MCV 82.7  78.0 - 100.0 (fL)   MCH 27.6  26.0 - 34.0 (pg)   MCHC 33.4  30.0 - 36.0 (g/dL)   RDW 11.9  14.7 - 82.9 (%)   Platelets 325  150 - 400 (K/uL)  GLUCOSE, CAPILLARY   Collection Time   08/23/11  6:52 AM      Component Value Range   Glucose-Capillary 104 (*) 70 - 99 (mg/dL)    Medications:  Scheduled    . cefTRIAXone (ROCEPHIN) IV  1 g Intravenous Q12H  . docusate sodium  100 mg Oral Daily  . glyBURIDE  5 mg Oral BID AC  . pantoprazole  40 mg Oral 1 day or 1 dose  . prenatal vitamin w/FE, FA  1 tablet Oral Daily   I have reviewed the patient's current medications.  ASSESSMENT: Patient Active Problem List  Diagnoses  . Pyelonephritis complicating pregnancy  . Liver abscess  . Right ovarian dermoid cyst  . Gestational diabetes mellitus, Class B    PLAN: 1) Presumed pyelonephritis: UA on admission had +nitrites and +LE, patient had a fever (Tmax 103) with WBC of 29K.  She has been treated with Rocephin 1g IV q12h.  However, her urine culture had 70K multiple bacterial morphotypes, and another possible etiology for her fever is her liver abscess.  Will continue Rocephin for now. 2) Liver abscess: Awaiting IR drainage today, she has been NPO since midnight.  The procedure will be at [facility] IR suite, nursing aware that patient needs to be transferred there via [facility]. Will monitor closely after procedure given increased risk of seeding/sepsis and also follow up analysis of the aspirate. 3) Right ovarian dermoid cyst: Talked to patient about this diagnosis in detail. No need for  acute intervention; patient will need surgery postpartum.  She reports also wanting a BTL; this can be done at the same time. 4) A2GDM: Continue CBG monitoring; Glyburide treatment. Continue routine antenatal care.   ANYANWU,UGONNA A 08/23/2011,7:25 AM

## 2011-08-23 NOTE — Progress Notes (Signed)
Pt reports feeling better after feeling chills and achy at 0330

## 2011-08-23 NOTE — Progress Notes (Signed)
Pt reports feeling chills and achy for about 30 minutes.  Temp checked and normal

## 2011-08-23 NOTE — Progress Notes (Signed)
Pt up to shower

## 2011-08-24 LAB — GLUCOSE, CAPILLARY
Glucose-Capillary: 113 mg/dL — ABNORMAL HIGH (ref 70–99)
Glucose-Capillary: 100 mg/dL — ABNORMAL HIGH (ref 70–99)
Glucose-Capillary: 77 mg/dL (ref 70–99)

## 2011-08-24 LAB — CBC
HCT: 30.3 % — ABNORMAL LOW (ref 36.0–46.0)
Hemoglobin: 10.3 g/dL — ABNORMAL LOW (ref 12.0–15.0)
MCH: 27.8 pg (ref 26.0–34.0)
MCHC: 34 g/dL (ref 30.0–36.0)
RBC: 3.7 MIL/uL — ABNORMAL LOW (ref 3.87–5.11)

## 2011-08-24 MED ORDER — FLEET ENEMA 7-19 GM/118ML RE ENEM
1.0000 | ENEMA | Freq: Once | RECTAL | Status: AC
  Administered 2011-08-24: 1 via RECTAL

## 2011-08-24 MED ORDER — BISACODYL 10 MG RE SUPP
10.0000 mg | Freq: Once | RECTAL | Status: AC
  Administered 2011-08-24: 10 mg via RECTAL
  Filled 2011-08-24: qty 1

## 2011-08-24 MED ORDER — SODIUM CHLORIDE 0.9 % IV SOLN
3.0000 g | Freq: Four times a day (QID) | INTRAVENOUS | Status: DC
  Administered 2011-08-24 – 2011-08-26 (×9): 3 g via INTRAVENOUS
  Filled 2011-08-24 (×12): qty 3

## 2011-08-24 NOTE — Consult Note (Signed)
ANTIBIOTIC CONSULT NOTE - INITIAL  Pharmacy Consult for Unasyn Indication: Liver abscess  Patient Measurements: Height: 5\' 6"  (167.6 cm) Weight: 233 lb 12.8 oz (106.051 kg) IBW/kg (Calculated) : 59.3   Vital Signs: Temp: 99 F (37.2 C) (09/03 0740) Temp src: Oral (09/03 0740) BP: 116/54 mmHg (09/03 0740) Pulse Rate: 104  (09/03 0740)  Labs:  Basename 08/24/11 0510 08/23/11 0533 08/22/11 0531  WBC 21.6* 29.7* 25.2*  HGB 10.3* 11.0* 11.1*  PLT 331 325 318  LABCREA -- -- --  CREATININE -- -- --  CRCLEARANCE -- -- --   Microbiology: Recent Results (from the past 720 hour(s))  URINE CULTURE     Status: Normal   Collection Time   08/19/11 11:20 AM      Component Value Range Status Comment   Specimen Description URINE, CLEAN CATCH   Final    Special Requests NONE   Final    Setup Time [id number]   Final    Colony Count 70,000 COLONIES/ML   Final    Culture     Final    Value: Multiple bacterial morphotypes present, none predominant. Suggest appropriate recollection if clinically indicated.   Report Status 08/21/2011 FINAL   Final   WET PREP, GENITAL     Status: Abnormal   Collection Time   08/19/11 12:20 PM      Component Value Range Status Comment   Yeast, Wet Prep NONE SEEN  NONE SEEN  Final    Trich, Wet Prep NONE SEEN  NONE SEEN  Final    Clue Cells, Wet Prep NONE SEEN  NONE SEEN  Final    WBC, Wet Prep HPF POC MANY (*) NONE SEEN  Final FEW BACTERIA SEEN  CULTURE, BLOOD (ROUTINE X 2)     Status: Normal (Preliminary result)   Collection Time   08/21/11  6:17 PM      Component Value Range Status Comment   Specimen Description BLOOD LEFT ARM   Final    Special Requests     Final    Value: BOTTLES DRAWN AEROBIC AND ANAEROBIC 10CC BOTH BOTTLES   Setup Time [id number]   Final    Culture     Final    Value:        BLOOD CULTURE RECEIVED NO GROWTH TO DATE CULTURE WILL BE HELD FOR 5 DAYS BEFORE ISSUING A FINAL NEGATIVE REPORT   Report Status PENDING   Incomplete     CULTURE, BLOOD (ROUTINE X 2)     Status: Normal (Preliminary result)   Collection Time   08/21/11  6:18 PM      Component Value Range Status Comment   Specimen Description BLOOD LEFT ARM   Final    Special Requests     Final    Value: BOTTLES DRAWN AEROBIC AND ANAEROBIC 10CC BOTH BOTTLES   Setup Time [id number]   Final    Culture     Final    Value:        BLOOD CULTURE RECEIVED NO GROWTH TO DATE CULTURE WILL BE HELD FOR 5 DAYS BEFORE ISSUING A FINAL NEGATIVE REPORT   Report Status PENDING   Incomplete   CULTURE, ROUTINE-ABSCESS     Status: Normal (Preliminary result)   Collection Time   08/23/11 12:57 PM      Component Value Range Status Comment   Specimen Description ABSCESS LIVER   Final    Special Requests PT ON ROCEPHIN   Final    Gram Stain  Final    Value: NO WBC SEEN     NO SQUAMOUS EPITHELIAL CELLS SEEN     NO ORGANISMS SEEN   Culture NO GROWTH 1 DAY   Final    Report Status PENDING   Incomplete    Medications:  Anti-infectives     Start     Dose/Rate Route Frequency Ordered Stop   08/19/11 1700   cefTRIAXone (ROCEPHIN) 1 g in dextrose 5 % 50 mL IVPB  Status:  Discontinued        1 g 100 mL/hr over 30 Minutes Intravenous Every 12 hours 08/19/11 1506 08/24/11 0853         Assessment: 35 yo G5P3013 at 34wk2d admitted for pyelonephritis but found to have a 8cm liver abscess, which was drained 9/2. Pharmacy consulted to dose Unasyn to cover liver abscess (per ID recommendation).  Plan:  Will start Unasyn 3g IV q6hr. Will follow up abscess cultures.  Michelene Heady Braxton 08/24/2011,9:28 AM

## 2011-08-24 NOTE — Progress Notes (Signed)
FACULTY PRACTICE ANTEPARTUM(COMPREHENSIVE) NOTE  [name] is a 35 y.o. G5P3013 at [redacted]w[redacted]d by LMP, early ultrasound who is admitted for pyelonephritis, found to have  8 cm liver abscess, drained 9/2 by IR [place],obtaining 60 cc purulent drainage during procedure, and with additonal 60 cc since return from procedure. Pain is   Adequately controlled on Percocet P.O. Tolerating diet. On Glyburide 5 bid with CBG's 140 last PM. Fetal presentation is unsure. Length of Stay:  5  Days  Subjective: Tol Reg diet Patient reports the fetal movement as active. Patient reports uterine contraction  activity as none. Patient reports  vaginal bleeding as none. Patient describes fluid per vagina as None.  Vitals:  Blood pressure 136/67, pulse 104, temperature 97.5 F (36.4 C), temperature source Oral, resp. rate 20, height 5\' 6"  (1.676 m), weight 106.051 kg (233 lb 12.8 oz), SpO2 100.00%. Physical Examination:  General appearance - alert, well appearing, and in no distress and denies any abd pain Abdomen - soft, nontender, nondistended Fundal Height:  size equals dates Pelvic Exam:   Cervical Exam:Membranes:intact  Fetal Monitoring:  Tid monitoring wnl  Labs:  Recent Results (from the past 24 hour(s))  GLUCOSE, CAPILLARY   Collection Time   08/23/11  2:19 PM      Component Value Range   Glucose-Capillary 96  70 - 99 (mg/dL)  GLUCOSE, CAPILLARY   Collection Time   08/23/11  6:59 PM      Component Value Range   Glucose-Capillary 142 (*) 70 - 99 (mg/dL)  CBC   Collection Time   08/24/11  5:10 AM      Component Value Range   WBC 21.6 (*) 4.0 - 10.5 (K/uL)   RBC 3.70 (*) 3.87 - 5.11 (MIL/uL)   Hemoglobin 10.3 (*) 12.0 - 15.0 (g/dL)   HCT 16.1 (*) 09.6 - 46.0 (%)   MCV 81.9  78.0 - 100.0 (fL)   MCH 27.8  26.0 - 34.0 (pg)   MCHC 34.0  30.0 - 36.0 (g/dL)   RDW 04.5  40.9 - 81.1 (%)   Platelets 331  150 - 400 (K/uL)  GLUCOSE, CAPILLARY   Collection Time   08/24/11  6:28 AM      Component Value  Range   Glucose-Capillary 113 (*) 70 - 99 (mg/dL)   WBC 91.4 down from 78G yesterday IMedications:  Scheduled    . butorphanol  1 mg Intravenous Once  . cefTRIAXone (ROCEPHIN) IV  1 g Intravenous Q12H  . docusate sodium  100 mg Oral Daily  . glyBURIDE  5 mg Oral BID AC  . pantoprazole  40 mg Oral 1 day or 1 dose  . prenatal vitamin w/FE, FA  1 tablet Oral Daily  . sodium chloride  10 mL Intracatheter Q8H   I have reviewed the patient's current medications.  ASSESSMENT: Patient Active Problem List  Diagnoses  . Pyelonephritis complicating pregnancy  . Liver abscess  . Right ovarian dermoid cyst  . Gestational diabetes mellitus, Class B  s/p drain placement into liver abscess 9/2  PLAN:  Continue Rocephin, INF Dis to review gram Stain and adjust antibiotics. Continue on Glyburide 5 bid  Lancelot Alyea V 08/24/2011,7:13 AM

## 2011-08-24 NOTE — Progress Notes (Signed)
Moderate results from fleets enema

## 2011-08-24 NOTE — Progress Notes (Signed)
  Subjective:    Patient ID: [name], female    DOB: [birth year], 35 y.o.   MRN: [id number] She is sore on her right flank but feels better. Emesis  Pertinent negatives include no fever.      Review of Systems  Constitutional: Negative for fever.  Gastrointestinal: Positive for vomiting.       Objective:   Physical Exam  Constitutional: No distress.  Abdominal:       Her abdomen is soft with mild right sided discomfort. There is about 20 cc of prurulent fluid in drain bulb.          Assessment & Plan:  The abscess gram stain is negative and cultures are negative at 24 hr. Most liver abscesses are polymicrobic so I will change to Unasyn pending final cultures.

## 2011-08-25 LAB — CBC
HCT: 33 % — ABNORMAL LOW (ref 36.0–46.0)
MCHC: 33.6 g/dL (ref 30.0–36.0)
MCV: 80.9 fL (ref 78.0–100.0)
Platelets: 312 10*3/uL (ref 150–400)
RDW: 14.8 % (ref 11.5–15.5)

## 2011-08-25 LAB — GLUCOSE, CAPILLARY
Glucose-Capillary: 59 mg/dL — ABNORMAL LOW (ref 70–99)
Glucose-Capillary: 75 mg/dL (ref 70–99)

## 2011-08-25 MED ORDER — NIFEDIPINE 10 MG PO CAPS
20.0000 mg | ORAL_CAPSULE | Freq: Once | ORAL | Status: DC
  Filled 2011-08-25: qty 2

## 2011-08-25 NOTE — Progress Notes (Signed)
UR Chart review completed.  

## 2011-08-25 NOTE — Progress Notes (Signed)
Pt refusing the Procardia at this time but feeling much better than earlier.

## 2011-08-25 NOTE — Progress Notes (Signed)
Dr. Orvan Falconer in to see pt and discuss POC and possibly d/c home and what that would involve.  MD also talked with her about the possibility of having a PICC placed tomorrow.

## 2011-08-25 NOTE — Progress Notes (Signed)
Pt denies feeling bad.  Pt given her snack to eat, will recheck CBG in 15-20 min

## 2011-08-25 NOTE — Progress Notes (Signed)
FACULTY PRACTICE ANTEPARTUM(COMPREHENSIVE) NOTE  [name] is a 35 y.o. [id number] at [redacted]w[redacted]d by LMP, early ultrasound who is admitted for pyelonephritis, found to have  8 cm liver abscess, drained on 9/2 by IR [place],obtaining 60 cc purulent drainage during procedure, and with additonal 90 cc on 9/2-9/3; and 110 cc from 9/3-9/4 since return from procedure.  Pain is adequately controlled on Percocet. Tolerating diet. On Glyburide 5 bid with stable CBGs. Fetal presentation is cephalic and this was on OB u/s done on 08/19/11. Patient was seen by Dr. Orvan Falconer (ID) and was started on Unasyn, while awaiting culture results.  No other reported symptoms.  She denies contractions, LOF, VB; endorses good FM.  Length of Stay:  6  Days  Vitals:  Blood pressure 137/68, pulse 94, temperature 97.5 F (36.4 C), temperature source Oral, resp. rate 20, height 5\' 6"  (1.676 m), weight 106.051 kg (233 lb 12.8 oz), SpO2 100.00%. Fetal Monitoring:  Reactive NST in the 140s; no contractions Physical Examination: General appearance - alert, well appearing, and in no distress and denies any abd pain Abdomen - soft, nontender, nondistended, IR drain C/D/I, no erythema. Small amount of bloody, purulent drainage in JP bulb.   Fundal Height:  size equals dates Pelvic Exam:  Deferred  Labs:  Recent Results (from the past 24 hour(s))  GLUCOSE, CAPILLARY   Collection Time   08/24/11 10:26 AM      Component Value Range   Glucose-Capillary 100 (*) 70 - 99 (mg/dL)   Comment 1 Documented in Chart     Comment 2 Notify RN    GLUCOSE, CAPILLARY   Collection Time   08/24/11  5:50 PM      Component Value Range   Glucose-Capillary 77  70 - 99 (mg/dL)   Comment 1 Documented in Chart     Comment 2 Notify RN    GLUCOSE, CAPILLARY   Collection Time   08/24/11 10:59 PM      Component Value Range   Glucose-Capillary 81  70 - 99 (mg/dL)  CBC   Collection Time   08/25/11  6:18 AM      Component Value Range   WBC 23.8 (*) 4.0 - 10.5  (K/uL)   RBC 4.08  3.87 - 5.11 (MIL/uL)   Hemoglobin 11.1 (*) 12.0 - 15.0 (g/dL)   HCT 98.1 (*) 19.1 - 46.0 (%)   MCV 80.9  78.0 - 100.0 (fL)   MCH 27.2  26.0 - 34.0 (pg)   MCHC 33.6  30.0 - 36.0 (g/dL)   RDW 47.8  29.5 - 62.1 (%)   Platelets 312  150 - 400 (K/uL)   WBC  23.8, up from 21.6Kyesterday Medications:  Scheduled    . ampicillin-sulbactam (UNASYN) IV  3 g Intravenous Q6H  . bisacodyl  10 mg Rectal Once  . docusate sodium  100 mg Oral Daily  . glyBURIDE  5 mg Oral BID AC  . pantoprazole  40 mg Oral 1 day or 1 dose  . prenatal vitamin w/FE, FA  1 tablet Oral Daily  . sodium chloride  10 mL Intracatheter Q8H  . sodium phosphate  1 enema Rectal Once  . DISCONTD: cefTRIAXone (ROCEPHIN) IV  1 g Intravenous Q12H   I have reviewed the patient's current medications.  ASSESSMENT: Patient Active Problem List  Diagnoses  . Pyelonephritis complicating pregnancy  . Liver abscess  . Right ovarian dermoid cyst  . Gestational diabetes mellitus, Class B  s/p drain placement into liver abscess 9/2  PLAN: Appreciate input by Dr. Orvan Falconer (ID); will continue Unasyn and await culture results Routine IR drain care Continue on Glyburide 5 bid for Class B GDM Will start disposition planning; contact Home Health, [facility] etc Continue routine antenatal care.   Kailan Carmen A 08/25/2011,7:36 AM

## 2011-08-25 NOTE — Progress Notes (Signed)
  Subjective:    Patient ID: [name], female    DOB: [birth year], 34 y.o.   MRN: [id number]  HPI Comments: She is feeling much better. She is having less flank pain.  Emesis       Review of Systems  Gastrointestinal: Negative for vomiting.       Objective:   Physical Exam  Constitutional: No distress.  Abdominal: Soft. She exhibits no distension. There is no tenderness.       Drain output about 40 cc today          Assessment & Plan:  The abscess culture is negative at 48 hours, probably due to antibiotic therapy before the specimen was obtained. She will need 4-6 weeks of Ab therapy and this is usually given IV initially so she will need a PICC if her  Ob team agrees. If cultures remain negative I will treat with IV Rocephin 2 gms daily and oral Flagyl. She will need a f/u CT once drain output is <10 cc daily.

## 2011-08-25 NOTE — Progress Notes (Signed)
CBG rechecked.  Not much better but pt just finishing her snack and juice.

## 2011-08-26 ENCOUNTER — Encounter (HOSPITAL_COMMUNITY): Payer: Self-pay | Admitting: *Deleted

## 2011-08-26 DIAGNOSIS — N10 Acute tubulo-interstitial nephritis: Secondary | ICD-10-CM

## 2011-08-26 DIAGNOSIS — O9989 Other specified diseases and conditions complicating pregnancy, childbirth and the puerperium: Secondary | ICD-10-CM

## 2011-08-26 DIAGNOSIS — K750 Abscess of liver: Secondary | ICD-10-CM

## 2011-08-26 DIAGNOSIS — O2390 Unspecified genitourinary tract infection in pregnancy, unspecified trimester: Secondary | ICD-10-CM

## 2011-08-26 LAB — CBC
MCH: 27.2 pg (ref 26.0–34.0)
MCHC: 33.9 g/dL (ref 30.0–36.0)
Platelets: 310 10*3/uL (ref 150–400)
RDW: 15 % (ref 11.5–15.5)

## 2011-08-26 LAB — CULTURE, ROUTINE-ABSCESS

## 2011-08-26 LAB — GLUCOSE, CAPILLARY
Glucose-Capillary: 69 mg/dL — ABNORMAL LOW (ref 70–99)
Glucose-Capillary: 130 mg/dL — ABNORMAL HIGH (ref 70–99)

## 2011-08-26 LAB — HEPATITIS B SURFACE ANTIGEN: Hepatitis B Surface Ag: NEGATIVE

## 2011-08-26 LAB — RUBELLA SCREEN: Rubella: 94.6 IU/mL — ABNORMAL HIGH

## 2011-08-26 LAB — RPR: RPR Ser Ql: NONREACTIVE

## 2011-08-26 LAB — HIV ANTIBODY (ROUTINE TESTING W REFLEX): HIV: NONREACTIVE

## 2011-08-26 MED ORDER — CITRIC ACID-SODIUM CITRATE 334-500 MG/5ML PO SOLN: 30.0000 mL | ORAL | Status: DC | PRN

## 2011-08-26 MED ORDER — BUTORPHANOL TARTRATE 2 MG/ML IJ SOLN
1.0000 mg | INTRAMUSCULAR | Status: DC | PRN
  Administered 2011-08-26: 10:00:00 via INTRAVENOUS

## 2011-08-26 MED ORDER — DIPHENHYDRAMINE HCL 25 MG PO CAPS: 25.0000 mg | ORAL_CAPSULE | Freq: Four times a day (QID) | ORAL | Status: DC | PRN

## 2011-08-26 MED ORDER — SIMETHICONE 80 MG PO CHEW: 80.0000 mg | CHEWABLE_TABLET | ORAL | Status: DC | PRN

## 2011-08-26 MED ORDER — LANOLIN HYDROUS EX OINT: TOPICAL_OINTMENT | CUTANEOUS | Status: DC | PRN

## 2011-08-26 MED ORDER — OXYTOCIN BOLUS FROM INFUSION
500.0000 mL | Freq: Once | INTRAVENOUS | Status: DC
  Filled 2011-08-26: qty 500

## 2011-08-26 MED ORDER — LIDOCAINE HCL (PF) 1 % IJ SOLN
30.0000 mL | INTRAMUSCULAR | Status: DC | PRN
  Filled 2011-08-26: qty 30

## 2011-08-26 MED ORDER — NIFEDIPINE 10 MG PO CAPS: 20.0000 mg | ORAL_CAPSULE | Freq: Once | ORAL | Status: DC

## 2011-08-26 MED ORDER — BENZOCAINE-MENTHOL 20-0.5 % EX AERO: 1.0000 "application " | INHALATION_SPRAY | CUTANEOUS | Status: DC | PRN

## 2011-08-26 MED ORDER — BUTORPHANOL TARTRATE 2 MG/ML IJ SOLN
INTRAMUSCULAR | Status: AC
  Filled 2011-08-26: qty 1

## 2011-08-26 MED ORDER — FLEET ENEMA 7-19 GM/118ML RE ENEM: 1.0000 | ENEMA | RECTAL | Status: DC | PRN

## 2011-08-26 MED ORDER — TETANUS-DIPHTH-ACELL PERTUSSIS 5-2.5-18.5 LF-MCG/0.5 IM SUSP
0.5000 mL | Freq: Once | INTRAMUSCULAR | Status: DC
  Filled 2011-08-26: qty 0.5

## 2011-08-26 MED ORDER — PRENATAL PLUS 27-1 MG PO TABS
1.0000 | ORAL_TABLET | Freq: Every day | ORAL | Status: DC
  Administered 2011-08-27 – 2011-08-28 (×2): 1 via ORAL
  Filled 2011-08-26 (×2): qty 1

## 2011-08-26 MED ORDER — IBUPROFEN 600 MG PO TABS
600.0000 mg | ORAL_TABLET | Freq: Four times a day (QID) | ORAL | Status: DC
  Administered 2011-08-26 – 2011-08-27 (×4): 600 mg via ORAL
  Filled 2011-08-26 (×5): qty 1

## 2011-08-26 MED ORDER — WITCH HAZEL-GLYCERIN EX PADS: 1.0000 "application " | MEDICATED_PAD | CUTANEOUS | Status: DC | PRN

## 2011-08-26 MED ORDER — IBUPROFEN 600 MG PO TABS: 600.0000 mg | ORAL_TABLET | Freq: Four times a day (QID) | ORAL | Status: DC | PRN

## 2011-08-26 MED ORDER — ONDANSETRON HCL 4 MG/2ML IJ SOLN: 4.0000 mg | INTRAMUSCULAR | Status: DC | PRN

## 2011-08-26 MED ORDER — METRONIDAZOLE 500 MG PO TABS
500.0000 mg | ORAL_TABLET | Freq: Three times a day (TID) | ORAL | Status: DC
  Administered 2011-08-26 – 2011-08-28 (×5): 500 mg via ORAL
  Filled 2011-08-26 (×6): qty 1

## 2011-08-26 MED ORDER — DIBUCAINE 1 % RE OINT: 1.0000 "application " | TOPICAL_OINTMENT | RECTAL | Status: DC | PRN

## 2011-08-26 MED ORDER — OXYTOCIN 20 UNITS IN LACTATED RINGERS INFUSION - SIMPLE
125.0000 mL/h | Freq: Once | INTRAVENOUS | Status: AC
  Administered 2011-08-26: 300 mL/h via INTRAVENOUS
  Filled 2011-08-26: qty 1000

## 2011-08-26 MED ORDER — ONDANSETRON HCL 4 MG PO TABS: 4.0000 mg | ORAL_TABLET | ORAL | Status: DC | PRN

## 2011-08-26 MED ORDER — GLYBURIDE 5 MG PO TABS
5.0000 mg | ORAL_TABLET | Freq: Every day | ORAL | Status: DC
  Administered 2011-08-27 – 2011-08-28 (×2): 5 mg via ORAL
  Filled 2011-08-26 (×3): qty 1

## 2011-08-26 MED ORDER — LACTATED RINGERS IV SOLN: INTRAVENOUS | Status: DC

## 2011-08-26 MED ORDER — OXYCODONE-ACETAMINOPHEN 5-325 MG PO TABS: 1.0000 | ORAL_TABLET | ORAL | Status: DC | PRN

## 2011-08-26 MED ORDER — ZOLPIDEM TARTRATE 5 MG PO TABS: 5.0000 mg | ORAL_TABLET | Freq: Every evening | ORAL | Status: DC | PRN

## 2011-08-26 MED ORDER — LACTATED RINGERS IV SOLN: 500.0000 mL | INTRAVENOUS | Status: DC | PRN

## 2011-08-26 MED ORDER — DEXTROSE 5 % IV SOLN
1.0000 g | INTRAVENOUS | Status: DC
  Administered 2011-08-26 – 2011-08-27 (×2): 1 g via INTRAVENOUS
  Filled 2011-08-26 (×2): qty 1

## 2011-08-26 NOTE — Progress Notes (Signed)
[name] is a 35 y.o. [id number] at [redacted]w[redacted]d by ultrasound admitted for nausea and vomiting, transferred to L&D for spontaneous onset of preterm labor.  Subjective: Has urge to push with contractions.   Objective: BP 138/71  Pulse 104  Temp(Src) 98.6 F (37 C) (Oral)  Resp 20  Ht 5\' 6"  (1.676 m)  Wt 233 lb 12.8 oz (106.051 kg)  BMI 37.74 kg/m2  SpO2 98% I/O last 3 completed shifts: In: -  Out: 170 [Drains:170]    FHT:  FHR: 130 bpm, variability: moderate,  accelerations:  Present,  decelerations:  Present early.  UC:   regular, every 2-3 minutes SVE:   Dilation: 9 Effacement (%): 90 Station: 0 Exam by:: Dr. Armen Pickup  Labs: Lab Results  Component Value Date   WBC 28.0* 08/26/2011   HGB 11.7* 08/26/2011   HCT 34.5* 08/26/2011   MCV 80.2 08/26/2011   PLT 310 08/26/2011    Assessment / Plan: Spontaneous preterm labor, progressing normally  Labor: Progressing normally Preeclampsia:  n/a Fetal Wellbeing:  Category I Pain Control:  stadol I/D:  Unasyn for GBS unknown status Anticipated MOD:  NSVD  Aydon Swamy 08/26/2011, 11:51 AM   Subjective:    Patient ID: [name], female    DOB: 01-31-1976, 35 y.o.   MRN: [id number]  HPI    Review of Systems     Objective:   Physical Exam        Assessment & Plan:

## 2011-08-26 NOTE — Progress Notes (Signed)
RN assist pt to BR for post delivery void. Moderate amount. Peri care given, fresh pad and ice pack in place.  Pt. Consumed reg. Diet without emesis. FOB remains at the bedside.

## 2011-08-26 NOTE — Progress Notes (Signed)
[name] is a 35 y.o. [id number] at [redacted]w[redacted]d admitted for fever, R pyelo, liver abscess. Reports large gush of clear fluid at ~0530 w/ continued leaking. Subjective: Coping well w/ UC's.   Objective: BP 140/64  Pulse 96  Temp(Src) 98.8 F (37.1 C) (Oral)  Resp 20  Ht 5\' 6"  (1.676 m)  Wt 106.051 kg (233 lb 12.8 oz)  BMI 37.74 kg/m2  SpO2 100% I/O last 3 completed shifts: In: -  Out: 170 [Drains:170]    FHT:  FHR: 150 bpm, variability: moderate,  accelerations:  Present,  decelerations:  Present few variables UC:   regular, every 5 minutes SVE:   7/80/-1, vtx, soft, anterior, grossly ruptured large amount of clear fluid Labs: Lab Results  Component Value Date   WBC 28.0* 08/26/2011   HGB 11.7* 08/26/2011   HCT 34.5* 08/26/2011   MCV 80.2 08/26/2011   PLT 310 08/26/2011    Assessment / Plan: Preterm labor Spontaneous labor, progressing normally Unknown GBS, received > 2 doses Unasyn S/P drainage of liver abscess Persistent leukocytosis  Labor: Progressing normally, preterm. Did NOT receive BMZ. Preeclampsia:  NA Fetal Wellbeing:  Category I -II Pain Control:  Labor support without medications I/D:  n/a Anticipated MOD:  NSVD  Kennah Hehr 08/26/2011, 9:28 AM

## 2011-08-26 NOTE — Progress Notes (Signed)
Orders received from [facility], pt transferred to L&D for delivery; SVE 80/7/-1; grossly ruptured.

## 2011-08-26 NOTE — Progress Notes (Addendum)
  Subjective:    Patient ID: [name], female    DOB: 04/06/1976, 35 y.o.   MRN: [id number]  HPI Comments: [name] went into labor this morning and delivered a baby boy. She is anxious to see him but is feeling much better.   Emesis       Review of Systems       Objective:   Physical Exam  Constitutional: No distress.  Abdominal: Soft. Bowel sounds are normal. She exhibits no distension. There is no tenderness.       She had 85 cc drain out put yesterday and 40 cc recorded so far today.          Assessment & Plan:  Her liver abscess culture is negative, most likely due to antibiotics before the culture specimen was obtained. I will place a PICC and switch her to IV Rocephin plus po Flagyl in anticipation of her discharge. Her son will probably be in the NICU for up to one week. Trying to manage a new son, PICC and IV Rocephin may be very difficult for her so I will consider early conversion to po antibiotics early next week if the abscess is well drained. She will need a f/u CT scan once drain output is <10 cc daily.   I will be out of town 9/6-10. Please call [name] for any ID issues in my absence. I will call her early next week to arrange ID clinic f/u.

## 2011-08-26 NOTE — Progress Notes (Signed)
Alert acknowledged - pt. Up to BR for void.

## 2011-08-26 NOTE — Progress Notes (Signed)
  Subjective:    Patient ID: Janaki Exley, female    DOB: May 19, 1976, 35 y.o.   MRN: [id number]  HPI At  a viable  female was delivered via NSVD (Presentation: OA ; OR ).  APGAR:8 , 9; weight unsure .  NICU team present for delivery. Baby sent to NICU. Placenta status: intact.  Cord: 3 vessel  with the following complications: None.  Anesthesia:  Stadol Episiotomy: none  Lacerations: none Suture Repair: none Est. Blood Loss (mL): 200 cc    Kendra Woolford 08/26/2011, 1:54 PM     Review of Systems     Objective:   Physical Exam        Assessment & Plan:

## 2011-08-26 NOTE — Progress Notes (Addendum)
  Subjective:    Patient ID: [name], female    DOB: Sep 06, 1976, 35 y.o.   MRN: [id number]  Emesis       Review of Systems  Gastrointestinal: Positive for vomiting.       Objective:   Physical Exam        Assessment & Plan:   [name] is a 35 y.o. [id number] at [redacted]w[redacted]d by LMP admitted to antenatal for nausea and vomiting, but transferred to L&D for spontaneous onset of labor.  Of note patient is a transfer of care from [facility]. We have requested but not yet received her prenatal records. She reports receiving care at [facility]  [phone]) and [facility] maternal fetal medicine  [phone]). She received MFM care for diabetes in pregnancy and advanced maternal age.    Subjective: No complaints. Pain tolerable with stadol.   Objective: BP 140/64  Pulse 96  Temp(Src) 98.8 F (37.1 C) (Oral)  Resp 20  Ht 5\' 6"  (1.676 m)  Wt 233 lb 12.8 oz (106.051 kg)  BMI 37.74 kg/m2  SpO2 100% I/O last 3 completed shifts: In: -  Out: 170 [Drains:170]    FHT:  FHR: 130s bpm, variability: moderate,  accelerations:  Present,  decelerations:  Absent UC:   regular, every 3 minutes SVE:   Dilation: 8 Effacement (%): 90 Station: -1 Exam by:: [employer], RN  Labs: Lab Results  Component Value Date   WBC 28.0* 08/26/2011   HGB 11.7* 08/26/2011   HCT 34.5* 08/26/2011   MCV 80.2 08/26/2011   PLT 310 08/26/2011    Assessment / Plan: Spontaneous preterm labor, progressing normally   Labor: Progressing normally Preeclampsia:  n/a Fetal Wellbeing:  Category I Pain Control:  Stadol I/D:  On Unasyn for spontaneuous liver abscess, which is also covering GBS unknown status. SO far blood cultures have been NGTD.  Anticipated MOD:  NSVD NICU team called and aware of pending delivery.  Prenatal labs: ordered HIV, RPR, Hep B, and rubella.   [name] 08/26/2011, 10:26 AM

## 2011-08-26 NOTE — Progress Notes (Addendum)
Upon entering room, pt. C/o wetness, and contracting. FHR 140's with variables down to 70's - with return to baseline (with no interventions) Pt. Rates ctx 6/10 Contractions palpated.  Upon exam. Peri pad soaked with clear fluid with a tinge of pinkness; chux and bed soaked.  RN assist patient to [facility] for possible void - pt. Voided and peri care provided.   CNM - Kim notified of patient's status - per Dr. Ellin Saba request. Dr. Marice Potter currently dealing with PP Hem.  08/26/11 0806  Membranes  Membrane Status Possible ROM - for evaluation

## 2011-08-26 NOTE — Progress Notes (Signed)
Spoke to Dr Orvan Falconer over phone and received verbal orders for when patient is ready for discharge then patient is to have [facility] provide care for patient's PICC line and also administer Rocephin 1 gm iv daily through PICC line through 09/03/11.  Nurse Care Manager will continue to follow for additional needs.

## 2011-08-26 NOTE — Progress Notes (Signed)
08/26/11 0806  Wound/Incision (LDAs)  Type of Wound/Incision (LDA) Incision (S/P liver abscess drainage on 9/2)   JP drain located on right, incision without s/s of infection. (no drainage noted from incision).

## 2011-08-27 ENCOUNTER — Inpatient Hospital Stay (HOSPITAL_COMMUNITY): Payer: Medicaid Other

## 2011-08-27 LAB — CBC
HCT: 27.8 % — ABNORMAL LOW (ref 36.0–46.0)
MCHC: 34.2 g/dL (ref 30.0–36.0)
MCV: 80.1 fL (ref 78.0–100.0)
Platelets: 278 10*3/uL (ref 150–400)
RDW: 14.8 % (ref 11.5–15.5)
WBC: 25.6 10*3/uL — ABNORMAL HIGH (ref 4.0–10.5)

## 2011-08-27 MED ORDER — SODIUM CHLORIDE 0.9 % IJ SOLN: 3.0000 mL | Freq: Two times a day (BID) | INTRAMUSCULAR | Status: DC

## 2011-08-27 NOTE — Progress Notes (Signed)
Post Partum Day 1 Subjective: no complaints, up ad lib, voiding, tolerating PO and + flatus Baby is stable in NICU, having some heart rate problems.   Objective: Blood pressure 105/65, pulse 66, temperature 98.5 F (36.9 C), temperature source Oral, resp. rate 18, height 5\' 6"  (1.676 m), weight 106.051 kg (233 lb 12.8 oz), SpO2 97.00%, unknown if currently breastfeeding.  >> Wants to breastfeed, but has a recurrent draining area on left breast from a prior trauma to that breast.  States it opens up 1-2 times per year and drains...she is concerned it might cause infection in the baby.  May want to supplement breastfeed on one side.   T-Max 99.4 at 2130 hrs  Physical Exam:  General: alert, cooperative, no distress and moderately obese Lochia: appropriate Uterine Fundus: firm Incision: healing well, no significant drainage     >>   JP drain into liver draining scant serosanguinous fluid.   DVT Evaluation: No evidence of DVT seen on physical exam.   Basename 08/27/11 0519 08/26/11 0515  HGB 9.5* 11.7*  HCT 27.8* 34.5*    Assessment/Plan: A:  PPD #1 Liver Abscess  P:  Routine postpartum care. Lactation consult PICC line prior to d/c Contraception:  Wants BTL, has not signed papers ... Will have them done today (states they talked about doing it when they  Remove her dermoid)    LOS: 8 days   [facility] 08/27/2011, 7:11 AM

## 2011-08-27 NOTE — Progress Notes (Signed)
UR Chart review completed.  

## 2011-08-28 LAB — GLUCOSE, CAPILLARY: Glucose-Capillary: 106 mg/dL — ABNORMAL HIGH (ref 70–99)

## 2011-08-28 LAB — CULTURE, BLOOD (ROUTINE X 2)
Culture  Setup Time: 201209010007
Culture: NO GROWTH

## 2011-08-28 LAB — CBC: RBC: 3.52 MIL/uL — ABNORMAL LOW (ref 3.87–5.11)

## 2011-08-28 MED ORDER — IBUPROFEN 600 MG PO TABS: 600.0000 mg | ORAL_TABLET | Freq: Four times a day (QID) | ORAL | Status: AC

## 2011-08-28 MED ORDER — METRONIDAZOLE 500 MG PO TABS: 500.0000 mg | ORAL_TABLET | Freq: Three times a day (TID) | ORAL | Status: DC

## 2011-08-28 MED ORDER — DSS 100 MG PO CAPS: 100.0000 mg | ORAL_CAPSULE | Freq: Every day | ORAL | Status: AC

## 2011-08-28 MED ORDER — METRONIDAZOLE 500 MG PO TABS: 500.0000 mg | ORAL_TABLET | Freq: Three times a day (TID) | ORAL | Status: AC

## 2011-08-28 MED ORDER — SIMETHICONE 80 MG PO CHEW: 80.0000 mg | CHEWABLE_TABLET | ORAL | Status: AC | PRN

## 2011-08-28 MED ORDER — OXYCODONE-ACETAMINOPHEN 5-325 MG PO TABS: 1.0000 | ORAL_TABLET | ORAL | Status: AC | PRN

## 2011-08-28 MED ORDER — ACETAMINOPHEN 500 MG PO TABS: 1000.0000 mg | ORAL_TABLET | Freq: Four times a day (QID) | ORAL | Status: AC | PRN

## 2011-08-28 MED ORDER — ONDANSETRON HCL 4 MG PO TABS: 4.0000 mg | ORAL_TABLET | ORAL | Status: AC | PRN

## 2011-08-28 MED ORDER — DEXTROSE 5 % IV SOLN: 500.0000 mg | INTRAVENOUS | Status: DC

## 2011-08-28 NOTE — Discharge Summary (Signed)
Obstetric Discharge Summary Reason for Admission: onset of labor and Presumed pyelonephritis along with liver abscess. Prenatal Procedures: NST, ultrasound and liver abscess drainage Intrapartum Procedures: spontaneous vaginal delivery Postpartum Procedures: complicated by liver abscess and pyelo, will d/c on IV abx with PICC line Complications-Operative and Postpartum: none Hemoglobin  Date Value Range Status  08/28/2011 9.6* 12.0-15.0 (g/dL) Final     HCT  Date Value Range Status  08/28/2011 28.2* 36.0-46.0 (%) Final    Discharge Diagnoses: Term Pregnancy-delivered and liver abscess, pyelonephritis  Discharge Information: Date: 08/28/2011 Activity: pelvic rest Diet: routine Medications: PNV, Ibuprophen, Colace, Percocet and IV antibiotics Condition: improved Instructions: refer to practice specific booklet Discharge to: home   Newborn Data: Live born female  Birth Weight: 5 lb 6.9 oz (2463 g) APGAR: 7, 9  Infant to stay in NICU for ~1 week.  Kelin Nixon, Beverely Pace A 08/28/2011, 7:28 AM

## 2011-08-28 NOTE — Discharge Summary (Signed)
Agree with note. 

## 2011-08-30 ENCOUNTER — Inpatient Hospital Stay (HOSPITAL_COMMUNITY)
Admission: EM | Admit: 2011-08-30 | Discharge: 2011-09-10 | DRG: 776 | Disposition: A | Discharge status: Home Health | Payer: Medicaid Other | Attending: Internal Medicine | Admitting: Internal Medicine

## 2011-08-30 DIAGNOSIS — D649 Anemia, unspecified: Secondary | ICD-10-CM | POA: Diagnosis not present

## 2011-08-30 DIAGNOSIS — J9600 Acute respiratory failure, unspecified whether with hypoxia or hypercapnia: Secondary | ICD-10-CM | POA: Diagnosis present

## 2011-08-30 DIAGNOSIS — O903 Peripartum cardiomyopathy: Secondary | ICD-10-CM | POA: Diagnosis present

## 2011-08-30 DIAGNOSIS — E2749 Other adrenocortical insufficiency: Secondary | ICD-10-CM | POA: Diagnosis present

## 2011-08-30 DIAGNOSIS — O99815 Abnormal glucose complicating the puerperium: Secondary | ICD-10-CM | POA: Diagnosis present

## 2011-08-30 DIAGNOSIS — I509 Heart failure, unspecified: Secondary | ICD-10-CM | POA: Diagnosis present

## 2011-08-30 DIAGNOSIS — K750 Abscess of liver: Secondary | ICD-10-CM | POA: Diagnosis present

## 2011-08-30 DIAGNOSIS — O9089 Other complications of the puerperium, not elsewhere classified: Secondary | ICD-10-CM | POA: Diagnosis present

## 2011-08-30 DIAGNOSIS — O99893 Other specified diseases and conditions complicating puerperium: Secondary | ICD-10-CM | POA: Diagnosis present

## 2011-08-30 DIAGNOSIS — I5041 Acute combined systolic (congestive) and diastolic (congestive) heart failure: Secondary | ICD-10-CM | POA: Diagnosis present

## 2011-08-30 DIAGNOSIS — N19 Unspecified kidney failure: Secondary | ICD-10-CM | POA: Diagnosis present

## 2011-08-30 DIAGNOSIS — I2510 Atherosclerotic heart disease of native coronary artery without angina pectoris: Principal | ICD-10-CM | POA: Diagnosis present

## 2011-08-30 DIAGNOSIS — D696 Thrombocytopenia, unspecified: Secondary | ICD-10-CM | POA: Diagnosis not present

## 2011-08-30 DIAGNOSIS — N289 Disorder of kidney and ureter, unspecified: Secondary | ICD-10-CM | POA: Diagnosis present

## 2011-08-30 DIAGNOSIS — J189 Pneumonia, unspecified organism: Secondary | ICD-10-CM | POA: Diagnosis present

## 2011-08-30 DIAGNOSIS — D367 Benign neoplasm of other specified sites: Secondary | ICD-10-CM | POA: Diagnosis present

## 2011-08-30 DIAGNOSIS — E669 Obesity, unspecified: Secondary | ICD-10-CM | POA: Diagnosis present

## 2011-08-30 DIAGNOSIS — E282 Polycystic ovarian syndrome: Secondary | ICD-10-CM | POA: Diagnosis present

## 2011-08-31 ENCOUNTER — Inpatient Hospital Stay (HOSPITAL_COMMUNITY): Payer: Medicaid Other

## 2011-08-31 ENCOUNTER — Emergency Department (HOSPITAL_COMMUNITY): Payer: Medicaid Other

## 2011-08-31 DIAGNOSIS — J9600 Acute respiratory failure, unspecified whether with hypoxia or hypercapnia: Secondary | ICD-10-CM

## 2011-08-31 DIAGNOSIS — J810 Acute pulmonary edema: Secondary | ICD-10-CM

## 2011-08-31 DIAGNOSIS — K750 Abscess of liver: Secondary | ICD-10-CM

## 2011-08-31 DIAGNOSIS — M7989 Other specified soft tissue disorders: Secondary | ICD-10-CM

## 2011-08-31 DIAGNOSIS — R0902 Hypoxemia: Secondary | ICD-10-CM

## 2011-08-31 LAB — URINALYSIS, ROUTINE W REFLEX MICROSCOPIC
Glucose, UA: NEGATIVE mg/dL
Nitrite: NEGATIVE
Specific Gravity, Urine: 1.013 (ref 1.005–1.030)
pH: 7 (ref 5.0–8.0)

## 2011-08-31 LAB — POCT I-STAT 3, ART BLOOD GAS (G3+)
Acid-base deficit: 2 mmol/L (ref 0.0–2.0)
Bicarbonate: 29.3 mEq/L — ABNORMAL HIGH (ref 20.0–24.0)
O2 Saturation: 89 %
TCO2: 31 mmol/L (ref 0–100)
pCO2 arterial: 68.7 mmHg (ref 35.0–45.0)
pH, Arterial: 7.24 — ABNORMAL LOW (ref 7.350–7.400)

## 2011-08-31 LAB — POCT I-STAT, CHEM 8
BUN: 9 mg/dL (ref 6–23)
Calcium, Ion: 1.11 mmol/L — ABNORMAL LOW (ref 1.12–1.32)
Chloride: 103 mEq/L (ref 96–112)
Glucose, Bld: 149 mg/dL — ABNORMAL HIGH (ref 70–99)
HCT: 37 % (ref 36.0–46.0)
Potassium: 3.2 mEq/L — ABNORMAL LOW (ref 3.5–5.1)

## 2011-08-31 LAB — BASIC METABOLIC PANEL
CO2: 30 mEq/L (ref 19–32)
Chloride: 103 mEq/L (ref 96–112)
Creatinine, Ser: 0.5 mg/dL (ref 0.50–1.10)
Potassium: 2.9 mEq/L — ABNORMAL LOW (ref 3.5–5.1)
Sodium: 140 mEq/L (ref 135–145)

## 2011-08-31 LAB — MRSA PCR SCREENING: MRSA by PCR: NEGATIVE

## 2011-08-31 LAB — BLOOD GAS, ARTERIAL
Acid-Base Excess: 4.3 mmol/L — ABNORMAL HIGH (ref 0.0–2.0)
Bicarbonate: 27.6 mEq/L — ABNORMAL HIGH (ref 20.0–24.0)
Bicarbonate: 29.6 mEq/L — ABNORMAL HIGH (ref 20.0–24.0)
FIO2: 100 %
Patient temperature: 98.6
TCO2: 28.9 mmol/L (ref 0–100)
TCO2: 31.4 mmol/L (ref 0–100)
pCO2 arterial: 56.5 mmHg — ABNORMAL HIGH (ref 35.0–45.0)
pH, Arterial: 7.453 — ABNORMAL HIGH (ref 7.350–7.400)
pO2, Arterial: 77.8 mmHg — ABNORMAL LOW (ref 80.0–100.0)

## 2011-08-31 LAB — GLUCOSE, CAPILLARY
Glucose-Capillary: 124 mg/dL — ABNORMAL HIGH (ref 70–99)
Glucose-Capillary: 119 mg/dL — ABNORMAL HIGH (ref 70–99)
Glucose-Capillary: 203 mg/dL — ABNORMAL HIGH (ref 70–99)

## 2011-08-31 LAB — DIFFERENTIAL
Basophils Absolute: 0.1 10*3/uL (ref 0.0–0.1)
Basophils Relative: 0 % (ref 0–1)
Eosinophils Absolute: 0.4 10*3/uL (ref 0.0–0.7)
Eosinophils Relative: 2 % (ref 0–5)
Neutrophils Relative %: 74 % (ref 43–77)

## 2011-08-31 LAB — COMPREHENSIVE METABOLIC PANEL
ALT: 16 U/L (ref 0–35)
Albumin: 2.2 g/dL — ABNORMAL LOW (ref 3.5–5.2)
Alkaline Phosphatase: 193 U/L — ABNORMAL HIGH (ref 39–117)
Calcium: 8.6 mg/dL (ref 8.4–10.5)
Glucose, Bld: 153 mg/dL — ABNORMAL HIGH (ref 70–99)
Potassium: 2.9 mEq/L — ABNORMAL LOW (ref 3.5–5.1)
Sodium: 138 mEq/L (ref 135–145)
Total Protein: 6.7 g/dL (ref 6.0–8.3)

## 2011-08-31 LAB — CBC
Hemoglobin: 10.3 g/dL — ABNORMAL LOW (ref 12.0–15.0)
MCH: 27 pg (ref 26.0–34.0)
Platelets: 464 10*3/uL — ABNORMAL HIGH (ref 150–400)
RBC: 3.81 MIL/uL — ABNORMAL LOW (ref 3.87–5.11)
RBC: 4.38 MIL/uL (ref 3.87–5.11)
RDW: 15 % (ref 11.5–15.5)
WBC: 19.3 10*3/uL — ABNORMAL HIGH (ref 4.0–10.5)

## 2011-08-31 LAB — LACTIC ACID, PLASMA: Lactic Acid, Venous: 1.4 mmol/L (ref 0.5–2.2)

## 2011-08-31 LAB — PRO B NATRIURETIC PEPTIDE: Pro B Natriuretic peptide (BNP): 6174 pg/mL — ABNORMAL HIGH (ref 0–125)

## 2011-08-31 LAB — URINE MICROSCOPIC-ADD ON

## 2011-08-31 LAB — PROTIME-INR: Prothrombin Time: 13.1 seconds (ref 11.6–15.2)

## 2011-08-31 LAB — PROCALCITONIN: Procalcitonin: 0.58 ng/mL

## 2011-08-31 LAB — LIPASE, BLOOD: Lipase: 96 U/L — ABNORMAL HIGH (ref 11–59)

## 2011-08-31 MED ORDER — IOHEXOL 300 MG/ML  SOLN
100.0000 mL | Freq: Once | INTRAMUSCULAR | Status: AC | PRN
  Administered 2011-08-31: 100 mL via INTRAVENOUS

## 2011-09-01 DIAGNOSIS — J9600 Acute respiratory failure, unspecified whether with hypoxia or hypercapnia: Secondary | ICD-10-CM

## 2011-09-01 DIAGNOSIS — I5041 Acute combined systolic (congestive) and diastolic (congestive) heart failure: Secondary | ICD-10-CM

## 2011-09-01 DIAGNOSIS — O903 Peripartum cardiomyopathy: Secondary | ICD-10-CM

## 2011-09-01 DIAGNOSIS — J189 Pneumonia, unspecified organism: Secondary | ICD-10-CM

## 2011-09-01 LAB — GLUCOSE, CAPILLARY
Glucose-Capillary: 119 mg/dL — ABNORMAL HIGH (ref 70–99)
Glucose-Capillary: 106 mg/dL — ABNORMAL HIGH (ref 70–99)
Glucose-Capillary: 138 mg/dL — ABNORMAL HIGH (ref 70–99)
Glucose-Capillary: 158 mg/dL — ABNORMAL HIGH (ref 70–99)

## 2011-09-01 LAB — LEGIONELLA ANTIGEN, URINE

## 2011-09-01 LAB — BASIC METABOLIC PANEL
BUN: 10 mg/dL (ref 6–23)
Calcium: 8.2 mg/dL — ABNORMAL LOW (ref 8.4–10.5)
Chloride: 105 mEq/L (ref 96–112)
GFR calc Af Amer: >60 mL/min (ref >60)
GFR calc non Af Amer: >60 mL/min (ref >60)
GFR calc non Af Amer: >60 mL/min (ref >60)
Potassium: 3.3 mEq/L — ABNORMAL LOW (ref 3.5–5.1)
Sodium: 145 mEq/L (ref 135–145)
Sodium: 143 mEq/L (ref 135–145)

## 2011-09-01 LAB — MAGNESIUM: Magnesium: 2 mg/dL (ref 1.5–2.5)

## 2011-09-01 LAB — BLOOD GAS, ARTERIAL
Acid-Base Excess: 4.8 mmol/L — ABNORMAL HIGH (ref 0.0–2.0)
FIO2: 0.7 %
MECHVT: 460 mL
O2 Saturation: 99.7 %
RATE: 20 resp/min
TCO2: 29.9 mmol/L (ref 0–100)

## 2011-09-01 LAB — PHOSPHORUS: Phosphorus: 2.8 mg/dL (ref 2.3–4.6)

## 2011-09-02 ENCOUNTER — Inpatient Hospital Stay (HOSPITAL_COMMUNITY): Payer: Medicaid Other

## 2011-09-02 LAB — GLUCOSE, CAPILLARY
Glucose-Capillary: 137 mg/dL — ABNORMAL HIGH (ref 70–99)
Glucose-Capillary: 117 mg/dL — ABNORMAL HIGH (ref 70–99)
Glucose-Capillary: 182 mg/dL — ABNORMAL HIGH (ref 70–99)

## 2011-09-02 LAB — CBC
MCV: 84.3 fL (ref 78.0–100.0)
Platelets: 404 10*3/uL — ABNORMAL HIGH (ref 150–400)
RDW: 15.7 % — ABNORMAL HIGH (ref 11.5–15.5)
WBC: 11.7 10*3/uL — ABNORMAL HIGH (ref 4.0–10.5)

## 2011-09-02 LAB — DIFFERENTIAL
Basophils Absolute: 0.1 10*3/uL (ref 0.0–0.1)
Eosinophils Absolute: 0.3 10*3/uL (ref 0.0–0.7)
Eosinophils Relative: 2 % (ref 0–5)

## 2011-09-02 LAB — BASIC METABOLIC PANEL
Chloride: 103 mEq/L (ref 96–112)
Creatinine, Ser: 0.48 mg/dL — ABNORMAL LOW (ref 0.50–1.10)
GFR calc Af Amer: >60 mL/min (ref >60)
GFR calc non Af Amer: >60 mL/min (ref >60)
Potassium: 3.6 mEq/L (ref 3.5–5.1)

## 2011-09-02 LAB — TROPONIN I: Troponin I: <0.3 ng/mL (ref <0.30)

## 2011-09-02 LAB — CK: Total CK: 52 U/L (ref 7–177)

## 2011-09-03 DIAGNOSIS — K750 Abscess of liver: Secondary | ICD-10-CM

## 2011-09-03 LAB — GLUCOSE, CAPILLARY: Glucose-Capillary: 101 mg/dL — ABNORMAL HIGH (ref 70–99)

## 2011-09-03 LAB — BASIC METABOLIC PANEL
BUN: 15 mg/dL (ref 6–23)
Calcium: 9.4 mg/dL (ref 8.4–10.5)
Creatinine, Ser: <0.47 mg/dL — ABNORMAL LOW (ref 0.50–1.10)
Potassium: 4.4 mEq/L (ref 3.5–5.1)

## 2011-09-04 ENCOUNTER — Inpatient Hospital Stay (HOSPITAL_COMMUNITY): Payer: Medicaid Other

## 2011-09-04 LAB — GLUCOSE, CAPILLARY
Glucose-Capillary: 113 mg/dL — ABNORMAL HIGH (ref 70–99)
Glucose-Capillary: 99 mg/dL (ref 70–99)
Glucose-Capillary: 97 mg/dL (ref 70–99)
Glucose-Capillary: 114 mg/dL — ABNORMAL HIGH (ref 70–99)

## 2011-09-04 LAB — VANCOMYCIN, TROUGH: Vancomycin Tr: 8.2 ug/mL — ABNORMAL LOW (ref 10.0–20.0)

## 2011-09-04 MED ORDER — IOHEXOL 300 MG/ML  SOLN
50.0000 mL | Freq: Once | INTRAMUSCULAR | Status: AC | PRN
  Administered 2011-09-04: 15 mL via INTRAVENOUS

## 2011-09-05 LAB — E. HISTOLYTICA ANTIBODY (AMOEBA AB)

## 2011-09-05 LAB — CBC
MCH: 27.1 pg (ref 26.0–34.0)
MCHC: 32.4 g/dL (ref 30.0–36.0)
Platelets: 494 10*3/uL — ABNORMAL HIGH (ref 150–400)
RDW: 15.2 % (ref 11.5–15.5)

## 2011-09-05 LAB — BASIC METABOLIC PANEL
Calcium: 9.4 mg/dL (ref 8.4–10.5)
GFR calc Af Amer: >60 mL/min (ref >60)
GFR calc non Af Amer: >60 mL/min (ref >60)
Glucose, Bld: 131 mg/dL — ABNORMAL HIGH (ref 70–99)
Sodium: 139 mEq/L (ref 135–145)

## 2011-09-06 LAB — CULTURE, BLOOD (ROUTINE X 2)

## 2011-09-07 LAB — PRO B NATRIURETIC PEPTIDE: Pro B Natriuretic peptide (BNP): 810.9 pg/mL — ABNORMAL HIGH (ref 0–125)

## 2011-09-07 LAB — BASIC METABOLIC PANEL
Chloride: 98 mEq/L (ref 96–112)
Creatinine, Ser: 1.82 mg/dL — ABNORMAL HIGH (ref 0.50–1.10)
GFR calc Af Amer: 38 mL/min — ABNORMAL LOW (ref >60)
Sodium: 135 mEq/L (ref 135–145)

## 2011-09-07 LAB — CBC
MCV: 82.5 fL (ref 78.0–100.0)
Platelets: 418 10*3/uL — ABNORMAL HIGH (ref 150–400)
RDW: 15 % (ref 11.5–15.5)
WBC: 11 10*3/uL — ABNORMAL HIGH (ref 4.0–10.5)

## 2011-09-07 LAB — GLUCOSE, CAPILLARY

## 2011-09-08 LAB — BASIC METABOLIC PANEL
BUN: 37 mg/dL — ABNORMAL HIGH (ref 6–23)
Chloride: 98 mEq/L (ref 96–112)
GFR calc non Af Amer: 23 mL/min — ABNORMAL LOW (ref >60)
Glucose, Bld: 108 mg/dL — ABNORMAL HIGH (ref 70–99)
Potassium: 4.6 mEq/L (ref 3.5–5.1)

## 2011-09-09 LAB — BASIC METABOLIC PANEL
BUN: 39 mg/dL — ABNORMAL HIGH (ref 6–23)
CO2: 27 mEq/L (ref 19–32)
Chloride: 94 mEq/L — ABNORMAL LOW (ref 96–112)
Creatinine, Ser: 2.45 mg/dL — ABNORMAL HIGH (ref 0.50–1.10)

## 2011-09-10 LAB — BASIC METABOLIC PANEL
BUN: 35 mg/dL — ABNORMAL HIGH (ref 6–23)
CO2: 27 mEq/L (ref 19–32)
Chloride: 98 mEq/L (ref 96–112)
Glucose, Bld: 100 mg/dL — ABNORMAL HIGH (ref 70–99)
Potassium: 4.5 mEq/L (ref 3.5–5.1)

## 2011-09-10 LAB — GLUCOSE, CAPILLARY: Glucose-Capillary: 100 mg/dL — ABNORMAL HIGH (ref 70–99)

## 2011-09-15 NOTE — Progress Notes (Signed)
NAMECRYSTALINA, [name]                ACCOUNT NO.:  [id number]  MEDICAL RECORD NO.:  [id number]  LOCATION:  2040                         FACILITY:  [facility]  PHYSICIAN:  Erick Blinks, MD     DATE OF BIRTH:  [birth year]                                PROGRESS NOTE   CURRENT PROBLEM LIST: 1. Acute combined congestive heart failure, improved. 2. 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 3. Acute respiratory failure secondary to acute combined congestive     heart failure, resolved. 4. Liver abscess, antibodies for Entamoeba histolytica positive, on     antibiotics, status post JP drain by Interventional Radiology. 5. Healthcare-acquired pneumonia. 6. Right chest pain likely pleurisy from pneumonia. 7. Acute renal failure, likely multifactorial. 8. Hyperkalemia, resolved.  ADMISSION HISTORY: This is a 35 year old female who is postpartum day 4 when presents to the hospital with complaints of shortness of breath.  She was found to have a multilobar pneumonia as well as liver abscess.  The patient developed respiratory failure requiring intubation.  She was started empirically on vancomycin and Zosyn and was admitted to the ICU under the critical care service.  HOSPITAL COURSE: 1. Respiratory failure.  The patient was intubated on admission and     was under the care of the critical care service.  She was started     empirically on antibiotics for pneumonia.  She did seem to have an     improvement and was extubated on August 31, 2011, following day.     Unfortunately, she did not tolerate this and developed recurrent     respiratory failure requiring intubation on the same day.  This was     felt to be secondary to pulmonary edema.  A 2-D echocardiogram was     checked and she was found to have a possible low ejection fraction,     initially stated ejection fraction of 25% to 30%.  The patient was     aggressively diuresed and was able to be liberated from the  vent     and was extubated on September 02, 2011, successfully. 2. Combined acute CHF.  The patient was seen in consultation by Dr. Jeanella Cara from Cardiology Service.  She was adequately diuresed and     placed on spironolactone, Lasix, as well as lisinopril.  She has     done very well with this diuresis.  Her weight has gone from 115 kg     on admission to 93 kg today.  She no longer feels short of breath.     Repeat echo was done today, which was a week after the official     echo which shows ejection fraction of 40% to 45% with global     hypokinesis.  The patient is being followed by Dr. Jacinto Halim while here     in the hospital.  Further adjustments in medication will be made     accordingly. 3. Acute renal failure.  The patient did develop some acute renal     failure.  Her creatinine has bumped to 2.39 today.  This was from a     normal creatinine on  admission.  This is likely multifactorial.     Her vancomycin level was found to be elevated at 40.  This is in     the setting of receiving diuresis as well as ACE inhibitors.  Her ACE inhibitors and diuretics have been held.  We will continue to     monitor her urine output by placing a Foley catheter.  Once her     renal function starts to improve, she can likely be further     dispositioned. 4. Liver abscess.  The patient was followed by Dr. Ninetta Lights while here     in the hospital.  Drain was readjusted by Interventional Radiology     to treat her abscess.  She was initially on vancomycin and Zosyn,     but this has been changed to ertapenem.  Her antibodies for     Entamoeba histolytica were found to be positive and she has been     started on Flagyl.  The plan will be for 21 days of ertapenem as     well as Flagyl 750 mg p.o. t.i.d. x10 days and then paromomycin 500     mg t.i.d. x7 days.  The patient already has a PICC line and will be     needed to set up with home health therapy prior to discharge.  CONSULTATIONS: 1.  Infectious Disease, Dr. Tinnie Gens C. Hatcher. 2. Cardiology, Dr. Pamella Pert. 3. Critical Care Medicine.  DIAGNOSTIC IMAGING: 1. Chest x-ray on admission shows central vascular congestion of     pulmonary edema pattern. 2. Chest x-ray on August 31, 2011, shows interval endotracheal tube     placement with 3.3 cm proximal to the carina, otherwise no     significant change. 3. CT of abdomen and pelvis on August 31, 2011, shows interval     increase in size of hepatic abscess measuring 9 x 8 x 7.7 cm.     Drainage catheter noted in the anterior aspect of the abscess.     Soft tissue swelling noted along the course of the drainage     catheter within the right lateral abdominal wall.  New right     adrenal hemorrhage.  The right adrenal gland now measures 5.2 x 2.6     cm, likely the hemorrhagic cyst in the right lower quadrant     measuring 6.9 cm in size.  Stones and sludge noted within the     gallbladder.  No evidence of acute cholecystitis.  Soft tissue     straightening within the anterior and lateral abdominal wall     suggest mild diffuse anasarca, chronic bilateral pars defect at L5,     right renal cyst noted. 4. CT angio of the chest on August 31, 2011, shows no evidence of     central pulmonary embolus, diffuse pulmonary edema with prominence     of interstitial and hazy airspace opacification, dense     consolidation involving the lower lobes bilaterally concerning for     infectious small bilateral pleural effusions seen. 5. Chest x-ray from August 31, 2011, shows shifting of pleural     fluid with perhaps minimal improvement and pulmonary edema with     remainder of findings without changes. 6. Chest x-ray from the same day shows endotracheal tube properly     positioned approximately 4 cm proximal to the carina. 7. Chest x-ray on September 02, 2011, shows stable support apparatus     with addition of an NG  tube and much improved lung aeration. 8. 2D  echocardiogram, ejection fraction of 25% to 30%, diffuse     hypokinesis.  Doppler parameters consistent with high ventricular     filling pressure.  Systolic pulmonary artery pressure of 43. 9. Echocardiogram on September 07, 2011, shows ejection fraction of     40% to 45% and diffuse hypokinesis.     Erick Blinks, MD     JM/MEDQ  D:  09/08/2011  T:  09/08/2011  Job:  [id number]  Electronically Signed by Durward Mallard Jlen Wintle  on 09/15/2011 09:34:24 PM

## 2011-09-16 ENCOUNTER — Ambulatory Visit (HOSPITAL_COMMUNITY)
Admit: 2011-09-16 | Discharge: 2011-09-16 | Disposition: A | Discharge status: Home / Self Care | Payer: Medicaid Other | Attending: Family Medicine | Admitting: Family Medicine

## 2011-09-16 DIAGNOSIS — N9489 Other specified conditions associated with female genital organs and menstrual cycle: Secondary | ICD-10-CM | POA: Insufficient documentation

## 2011-09-18 ENCOUNTER — Ambulatory Visit (INDEPENDENT_AMBULATORY_CARE_PROVIDER_SITE_OTHER): Payer: Medicaid Other | Admitting: Obstetrics & Gynecology

## 2011-09-18 ENCOUNTER — Encounter: Payer: Self-pay | Admitting: Obstetrics & Gynecology

## 2011-09-18 VITALS — BP 114/78 | HR 83 | Temp 97.0°F | Ht 66.0 in | Wt 198.4 lb

## 2011-09-18 DIAGNOSIS — K750 Abscess of liver: Secondary | ICD-10-CM

## 2011-09-18 DIAGNOSIS — E119 Type 2 diabetes mellitus without complications: Secondary | ICD-10-CM

## 2011-09-18 DIAGNOSIS — R6520 Severe sepsis without septic shock: Secondary | ICD-10-CM

## 2011-09-18 DIAGNOSIS — E669 Obesity, unspecified: Secondary | ICD-10-CM | POA: Insufficient documentation

## 2011-09-18 DIAGNOSIS — A419 Sepsis, unspecified organism: Secondary | ICD-10-CM

## 2011-09-18 DIAGNOSIS — D279 Benign neoplasm of unspecified ovary: Secondary | ICD-10-CM

## 2011-09-18 HISTORY — DX: Sepsis, unspecified organism: A41.9

## 2011-09-18 HISTORY — DX: Obesity, unspecified: E66.9

## 2011-09-18 NOTE — Progress Notes (Signed)
History:   35 year old [id number] here today for surgical consultation.  Patient is recently postpartum but had a very complicated antepartum and postpartum course.  She was admitted antepartum for a liver abscess and a drain was placed by IR.  This was complicated by preterm labor and vaginal delivery of her infant at 34.[redacted] weeks GA on 08/26/11; infant is currently at [facility] for management of significant VSD.  Initially, she had an uncomplicated postpartum course and was sent home on PPD#2.  However, on PPD#4, she developed marked respiratory distress and went to [facility].  She was evaluated and noted to have acute cardiopulmonary dysfunction; EF was 20%.  She was admitted to the ICU, intubated and managed aggressively.   For further details, please refer to the discharge summary by Dr. Erick Blinks on 09/08/11 ; filed in [place] on 09/09/11.  ECHO on 09/07/11 showed improved 40-45% with mild diffuse hypokineses.  Currently, patient is still followed by Cardiology, IR and Infectious Diseases.  She has a PICC line in place and the IR drain is still in place; she has Home Health Nursing. HOSPITAL PROBLEM LIST (during admission 08/30/11 to 09/08/11):  1. Acute combined congestive heart failure, improved.  2. 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  3. Acute respiratory failure secondary to acute combined congestive heart failure, resolved.  4. Liver abscess, antibodies for Entamoeba histolytica positive, on antibiotics, status post JP drain by Interventional Radiology.  5. Healthcare-acquired pneumonia.  6. Right chest pain likely pleurisy from pneumonia.  7. Acute renal failure, likely multifactorial.  8. Hyperkalemia, resolved.   Patient Active Problem List  Diagnoses  . Liver abscess  . Right ovarian dermoid cyst  . Diabetes mellitus type II  . SIRS due to infectious process with acute organ dysfunction  . Obesity   The following portions of the patient's history were reviewed and  updated as appropriate: allergies, current medications, past family history, past medical history, past social history, past surgical history and problem list.  She has no other postpartum or gynecologic concerns.  She reports being up-to-date with all preventative health care maintenance     Objective:  Physical Exam Blood pressure 114/78, pulse 83, temperature 97 F (36.1 C), temperature source Oral, height 5\' 6"  (1.676 m), weight 198 lb 6.4 oz (89.994 kg). Gen: NAD Lungs: CTAB Heart: RRR Abd: Soft/NT/ND.  IR drain in R flank area, serous drainage noted in  bulb. Pelvic: Normal appearing external genitalia; normal appearing vaginal mucosa and cervix.  Small uterus, no palpable masses or adnexal tenderness; dermoid not palpated secondary to habitus.  Labs and Imaging CT  PELVIS- GYN PORTION (08/31/11)  A likely dermoid cyst is again noted at the right lower quadrant, measuring approximately 6.9 x 6.9 x 5.9 cm, with large areas of fat. This was previously identified; the right ovary is not well assessed on this study. The appendix is normal in caliber and contains air, without evidence for appendicitis. The colon is mildly distended with stool and is unremarkable in appearance. The bladder is decompressed, with a Foley catheter in place. The uterus is enlarged, reflecting recent postpartum status. Soft tissue stranding is noted within the anterior and lateral abdominal wall, suggestive of mild diffuse anasarca. No inguinal lymphadenopathy is seen.  TRANSABDOMINAL AND TRANSVAGINAL ULTRASOUND OF PELVIS (09/16/11) Uterus: Demonstrates a sagittal length of 10.0 cm, an AP depth of 6.6 cm and a transverse width of 7.7 cm. Size is compatible with the patient's postpartum status. A homogeneous myometrium is  seen.  Endometrium: Is distended with a width of 2.6 cm and contains some heterogeneous soft tissue which extends into the endocervical canal. This demonstrates no flow with color Doppler evaluation  and  is most compatible with retained blood products postpartum.  Right ovary: A heterogeneous mass is identified associated with the right adnexa measuring 6.4 x 6.8 x 5.8 cm. This would correlate with the dermoid seen on prior CT. The sonographic appearance is compatible with a dermoid. Left ovary: Measures 3.6 x 2.5 x 2.4 cm and has a normal appearance  Other findings: No pelvic fluid is seen.  IMPRESSION: Right adnexal complex mass compatible with the previously demonstrated ovarian dermoid with intralesional fat confirmed on prior CT. Distended endometrial canal containing heterogeneous avascular soft tissue most compatible with retained endometrial blood products. Retained products of conception are not absolutely excluded with this appearance but are felt less likely given the avascular nature of today's finding. Normal myometrium and left ovary.  ECHOCARDIOGRAM (09/07/11)  Ejection fraction of 40% to 45% and diffuse hypokinesis.  Assessment & Plan:   Patient was advised that though she needs surgery for her dermoid cyst and she desires sterilization; these procedures do not need to occur urgently.  There is a rare risk of dermoid perforation which could cause peritonitis, but this is very unlikely.  She was told to follow up with her Cardiologist and obtain preoperative clearance because this will have to be done via minilaparotomy.    Once cleared by Cardiology, will proceed with ELAP, RSO, bilateral salpingectomy. This will be scheduled in 6-8 weeks to give time for recovery and preoperative clearance.   The risks of surgery were discussed in detail with the patient including but not limited to: bleeding which may require transfusion or reoperation; infection which may require prolonged hospitalization or re-hospitalization and antibiotic therapy; injury to bowel, bladder, ureters and major vessels or other surrounding organs; need for additional procedures; thromboembolic phenomenon, incisional  problems; cardiopulmonary complications and other postoperative or anesthesia complications. All questions were answered.  She was told that she will be contacted by our surgical scheduler regarding the time and date of her surgery; routine preoperative instructions of having nothing to eat or drink after midnight on the day prior to surgery and also coming to the hospital 1 1/2 hours prior to her time of surgery were also emphasized.  She was told she will be called for a preoperative appointment prior to surgery and will be given further preoperative instructions at that visit.  Patient was advised to call or go to the ER for any other concerns.

## 2011-09-25 ENCOUNTER — Encounter: Payer: Self-pay | Admitting: Infectious Diseases

## 2011-09-25 ENCOUNTER — Ambulatory Visit (INDEPENDENT_AMBULATORY_CARE_PROVIDER_SITE_OTHER): Payer: Medicaid Other | Admitting: Infectious Diseases

## 2011-09-25 VITALS — BP 119/79 | HR 81 | Temp 98.4°F | Ht 66.0 in | Wt 204.0 lb

## 2011-09-25 DIAGNOSIS — N19 Unspecified kidney failure: Secondary | ICD-10-CM

## 2011-09-25 DIAGNOSIS — K750 Abscess of liver: Secondary | ICD-10-CM

## 2011-09-25 HISTORY — DX: Unspecified kidney failure: N19

## 2011-09-25 LAB — CBC WITH DIFFERENTIAL/PLATELET
Basophils Relative: 1 % (ref 0–1)
Eosinophils Absolute: 0.4 10*3/uL (ref 0.0–0.7)
Eosinophils Relative: 4 % (ref 0–5)
Hemoglobin: 13.9 g/dL (ref 12.0–15.0)
Lymphs Abs: 2.9 10*3/uL (ref 0.7–4.0)
MCH: 26.2 pg (ref 26.0–34.0)
MCHC: 31.3 g/dL (ref 30.0–36.0)
MCV: 83.6 fL (ref 78.0–100.0)
Monocytes Absolute: 0.7 10*3/uL (ref 0.1–1.0)
Monocytes Relative: 7 % (ref 3–12)
RBC: 5.31 MIL/uL — ABNORMAL HIGH (ref 3.87–5.11)

## 2011-09-25 LAB — COMPREHENSIVE METABOLIC PANEL
AST: 14 U/L (ref 0–37)
Alkaline Phosphatase: 82 U/L (ref 39–117)
BUN: 15 mg/dL (ref 6–23)
Glucose, Bld: 95 mg/dL (ref 70–99)
Potassium: 4.8 mEq/L (ref 3.5–5.3)
Sodium: 141 mEq/L (ref 135–145)
Total Bilirubin: 0.4 mg/dL (ref 0.3–1.2)
Total Protein: 7.4 g/dL (ref 6.0–8.3)

## 2011-09-25 NOTE — Progress Notes (Signed)
  Subjective:    Patient ID: [name], female    DOB: October 11, 1976, 35 y.o.   MRN: [id number]  HPI 35 year old female who was admitted to [facility] on 08/30/2011 who was postpartum day 4. She had developed shortness of breath while at home. She was found to have multiple lobar pneumonia as well as a liver abscess. She required intubation for respiratory failure. She was empirically placed on vancomycin and Zosyn. As part of her workup she had a transthoracic echocardiogram which showed a low ejection fraction (40-45% and global hypokinesis), she was felt to have a postpartum cardiomyopathy. She had a drain placed in her liver abscess by interventional radiology. She had blood cultures done the hospital on September 10 at her negative. She had the abscess fluid from her liver sent to the microbiology e, these cultures were negative as well. She had Entamoeba histolytica antibodies sent on her serum. These were positive, and she was started on a 10 day course of Flagyl. Followed by a 7 days of paramomycin.  She was d/c home on IV ertapenem, to complete October 10. Her hospital course was complicated by acute renal failure- her Cr on admission was 0.5 and this increased to 2.45 at maximum. By d/c it had come down to 2.1  "things seem to be getting back to normal" now that she is back home. No fevers/chills, no SOB, no DOE, no abd pain unless she "moves a certain" and the JP drain gets pulled on. No problems with PIC line. She has had minimal drainage in JP, has not changed in 3 days.   Review of Systems  Constitutional: Negative for fever and chills.  Respiratory: Negative for shortness of breath.    See HPI.     Objective:   Physical Exam  Constitutional: She appears well-developed and well-nourished.  HENT:  Mouth/Throat:    Eyes: EOM are normal. Pupils are equal, round, and reactive to light.  Neck: Neck supple.  Cardiovascular: Normal rate, regular rhythm and normal heart sounds.    Pulmonary/Chest: Effort normal and breath sounds normal.  Abdominal: Soft. Normal appearance and bowel sounds are normal. There is no tenderness.    Musculoskeletal:       Arms: Lymphadenopathy:    She has no cervical adenopathy.          Assessment & Plan:

## 2011-09-25 NOTE — Assessment & Plan Note (Signed)
Will set her up for repeat CT of the abdomen. She needs to be re-eval by IR to see if she can have her drain pulled. If her CT is negative she can have her drain pulled and her PIC removed. She is completing her amoebocidal therapy. Will have my RN/CMA f/u her results and ask my partners for assistance if needed while i am out of town over the weekend.

## 2011-09-25 NOTE — Assessment & Plan Note (Signed)
Will repeat her Cr today to see how much recovery she has had. She will have repeat CT without contrast

## 2011-09-29 ENCOUNTER — Telehealth: Payer: Self-pay | Admitting: *Deleted

## 2011-09-29 NOTE — Telephone Encounter (Signed)
Patient called asked if her CT had been scheduled. She also says that she is out of meds so her PICC is not being used. She understands that the CT will tell whether or not the PICC can be removed. Advised patient that I will forward the information to the providers assistant and have her check into and call patient back asap.

## 2011-09-30 NOTE — Telephone Encounter (Signed)
Patient scheduled for CT for 10/01/11 @ 10:30 AM.  Patient notified, will have a provider look at her CT and if OK will  Call [facility] to have her PICC removed. Wendall Mola CMA

## 2011-10-01 ENCOUNTER — Telehealth: Payer: Self-pay | Admitting: *Deleted

## 2011-10-01 ENCOUNTER — Ambulatory Visit (HOSPITAL_COMMUNITY)
Admission: RE | Admit: 2011-10-01 | Discharge: 2011-10-01 | Disposition: A | Discharge status: Home / Self Care | Payer: Medicaid Other | Source: Ambulatory Visit | Attending: Infectious Diseases | Admitting: Infectious Diseases

## 2011-10-01 DIAGNOSIS — Z4889 Encounter for other specified surgical aftercare: Secondary | ICD-10-CM | POA: Insufficient documentation

## 2011-10-01 DIAGNOSIS — K651 Peritoneal abscess: Secondary | ICD-10-CM | POA: Insufficient documentation

## 2011-10-01 DIAGNOSIS — N83209 Unspecified ovarian cyst, unspecified side: Secondary | ICD-10-CM | POA: Insufficient documentation

## 2011-10-01 DIAGNOSIS — K750 Abscess of liver: Secondary | ICD-10-CM

## 2011-10-01 NOTE — Telephone Encounter (Signed)
CT results reviewed by Dr. Orvan Falconer and order given for PICC removal.  Patient notified, [facility] called and verbal order given. Wendall Mola CMA

## 2011-10-05 NOTE — Discharge Summary (Signed)
[name], [name]                ACCOUNT NO.:  [id number]  MEDICAL RECORD NO.:  [id number]  LOCATION:                                 FACILITY:  PHYSICIAN:  Clydia Llano, MD       DATE OF BIRTH:  06/16/1976  DATE OF ADMISSION:  08/30/2011 DATE OF DISCHARGE:  09/10/2011                              DISCHARGE SUMMARY   PRIMARY CARE PHYSICIAN:  None.  PRIMARY CARDIOLOGIST:  Pamella Pert, MD.  INFECTIOUS DISEASE:  Lacretia Leigh. Ninetta Lights, MD.  DISCHARGE DIAGNOSES: 1. Acute combined congestive heart failure, improved. 2. Postpartum cardiomyopathy. 3. Acute respiratory failure secondary to congestive heart failure,     resolved. 4. 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 5. Healthcare-acquired pneumonia. 6. Right chest pain likely pleurisy from pneumonia and from liver     abscess. 7. Acute renal failure, likely multifactorial. 8. Hyperkalemia, resolved.  RADIOLOGY: 1. Chest x-ray on September 10 showed central vascular congestion and     pulmonary edema. 2. CT angio chest showed no evidence of central pulmonary embolus,     evaluation of pulmonary embolus significant for suboptimal due to     extensive air space opacification.  There is diffuse prominence of     the interstitium with hazy opacification, expanded portion of both     lungs compatible with diffuse pulmonary edema, this was dense     consolidation, the majority of the left lower lobe of the entirety     of the right lower lobe which raised concern about infection. 3. Abdominal x-ray September 10 showed interval increase in size of     hepatic abscess, measuring 9.0 x 8.0 x 7.7 cm.  Drainage catheter     noted to be in the anterior aspect of the abscess.  Soft tissue and     stranding noted along the course of the drainage catheter within     the right lateral abdominal wall.  New right adrenal hemorrhage.     The right adrenal gland now measures 5.2 x 2.6 cm likely dermoid     cyst  again noted in the right lower quadrant measuring 6.9 x 9 cm.     Stones or sludge noted in the gallbladder.  No evidence of     cholecystitis.  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 4. Chest x-ray September 10 showed shifting of pleural fluid and     minimal improvement of the pulmonary edema. 5. Chest x-ray September 12 showed stable support apparatus in     addition to NG tube. 6. PICC line placement, September 14, successful exchange and     repositioning of hepatic abscess drained.  BRIEF HISTORY EXAMINATION:  This is a 35 year old female who is postpartum day #4 when she was presented to the hospital with complaint of shortness of breath.  She was found to have multilobar pneumonia as well as liver abscess.  The patient developed the above.  The patient was in respiratory failure, got intubated, admitted to the ICU.  The patient did have marked fluid overload and congestive heart failure upon admission.  HOSPITAL COURSE: 1. Respiratory failure.  The patient intubated on  admission and     admitted by the Critical Care Service, was started empirically on     antibiotic for pneumonia.  The patient did seem to have improvement     with aggressive diuresis as well as antibiotics for the pneumonia,     extubated on August 31, 2011.  Unfortunately, the patient did     not tolerate this and develop recurrent respiratory failure,     requiring reintubation on the same day.  This was felt secondary to     pulmonary edema.  2-D echocardiogram was checked and she was found     to have possible low ejection fraction, initially ejection fraction     stated as 25%-30%.  The patient aggressively diuresed and was be     able to liberated from the ventilator and was extubated on     September 12 successfully. 2. Combined acute CHF.  The patient was seen by consultation by Dr.     Delrae Rend from Cardiology Service.  She was adequately     diuresed and placed on spironolactone,  Lasix, and lisinopril, as     well as Coreg.  The patient was done very well with the diuresis,     weight went down from 115 kg on admission to 92 kg today.  The     patient no longer feels that she is short of breath.  The echo was     repeated on the 17th and it was 1 week after the initial echo and     showed ejection fraction of 40%-45% with global hypokinesis.  This     has been discussed with Dr. Jacinto Halim.  The patient was being followed     by Dr. Jacinto Halim in the hospital further and he recommended to go home     on Coreg and digoxin and to discontinue the Lasix as the patient     does have no symptoms or signs of fluid overload now. 3. Liver abscess.  This is being followed by Dr. Ninetta Lights while she is     in the hospital.  Drain was readjusted by Interventional Cardiology     to treat her abscess.  She was initially on vancomycin and Zosyn.     This was switched to ertapenem.  Dr. Ninetta Lights recommended for total     of 21 days of ertapenem as well as 10 days of oral high-dose Flagyl     of 750 three times a day, and then after finished the Flagyl to     start Humatin 500 mg x7 days.  The patient has PICC line been set     up as the patient therapy for that.  The patient will have 18 more     days of the Invanz and 7 more days on the high dose Flagyl. 4. Acute renal failure.  The patient developed acute kidney injury     while she is in the hospital.  On September 15, her creatinine was     0.5 and went to 2.45 and then it is going down, today is 2.1.  the     acute renal failure is multifactorial probably secondary to ACE     inhibitors, Lasix, aldactone, vancomycin, and it is noted also the     patient has got 100 mL of IV contrast dye for the CT abdomen and     CTA of the chest.  All these probably contributed to her renal     failure  which is improving now slowly.  Per Dr. Verl Dicker notes, the     patient does not have symptoms or signs of fluid overload, so she     can go home on  beta-blocker and digoxin.  Lisinopril can be started     later when the kidney function improves.  Kidney function expected     to improve within the next few days. 5. Diabetes mellitus type 2.  The patient has hemoglobin A1c of 6.6.     The patient does not know if she had history of diabetes.  The     patient does not have history of diabetes mellitus.  The patient     was just pregnant about 14 days ago.  The patient instructed to go     with low-carbohydrate diet to control that with the intake of her     blood sugar.  The patient needs to follow up with primary care     physician for that.  Was not started on oral hypoglycemic agent,     and because of the relatively low hemoglobin A1c, the diet thought     will be sufficient.  DISCHARGE MEDICATIONS: 1. Coreg 6.25 mg p.o. b.i.d. 2. Digoxin 0.125 mg p.o. daily. 3. Invanz 1 gram IV daily for 18 more days. 4. Metronidazole 250 mg take 3 tablets every 8 hours for seven more     days. 5. Paromomycin 500 mg three times a day for seven days, take this     after he finished taking the Flagyl.     Clydia Llano, MD     ME/MEDQ  D:  09/10/2011  T:  09/10/2011  Job:  [id number]  cc:   Lacretia Leigh. Ninetta Lights, M.D. Pamella Pert, MD  Electronically Signed by Clydia Llano  on 10/05/2011 01:27:39 PM

## 2011-10-13 ENCOUNTER — Ambulatory Visit: Payer: Medicaid Other | Admitting: Infectious Diseases

## 2011-10-16 NOTE — Consult Note (Signed)
[name], [name]                ACCOUNT NO.:  [id number]  MEDICAL RECORD NO.:  [id number]  LOCATION:  3112                         FACILITY:  [facility]  PHYSICIAN:  Pamella Pert, MD DATE OF BIRTH:  1976-07-24  DATE OF CONSULTATION:  09/01/2011 DATE OF DISCHARGE:                                CONSULTATION   REASON FOR CONSULTATION:  Postpartum cardiomyopathy, congestive heart failure, possible MI.  CONSULTATIONS: 1. Infectious Disease, Dr. Orvan Falconer. 2. Critical Care, Dr. Marchelle Gearing. 3. Dr. Jacinto Halim, Cardiology.  PROCEDURES PERFORMED:  2-D echo with contrast, which revealed at the conclusions: 1. Left ventricular, cavity size mildly dilated, systolic function was     severely reduced.  The estimated ejection fraction was in the range     of 25% to 30% with diffuse hypokinesis.  Doppler parameters are     consistent with high ventricular filling pressure. 2. Left atrium.  The atrium was moderately dilated. 3. Right ventricle.  The cavity size was mildly dilated. 4. Right atrium.  The atrium was mildly dilated. 5. Pulmonary arteries.  Systolic pressure was mildly increased.  PAP     pressure 43 mmHg.  EKG.  See EKG report.  PERTINENT LABORATORY FINDINGS:  On admission, the patient had a CBC with a white count of 19.3, H and H 11/35, platelet count of 464, and INR of 0.97.  CMP 138/2.9, 100/30, glucose of 153.  BUN/creatinine of 10/0.47. Albumin of 2.2, calcium 8.6, AST 19, ALT 16, alkaline phosphatase 193, bilirubin 0.2, lipase 96.  Urinalysis on admission showed large blood, moderate leukocytes, negative nitrites.  Her troponins on admission 0.3. Subsequent troponin on September 01, 2011, was unchanged at 0.3. Subsequent CBC was 20.7 with hemoglobin of 10.3/31.4 and platelet count of 409.  The patient's CK-MB of 1.5 with a creatinine kinase of 36. Lactic acid was 1.4.  Procalcitonin was 0.58.  Urinary strep pneumo was negative.  Abscess culture was no growth.  Blood  cultures x2 were no growth.  Her pro-BMP done on August 31, 2011, was [id number].  CHIEF COMPLAINT:  Respiratory failure, sepsis, liver abscess, congestive heart failure, pulmonary edema.  HOSPITAL COURSE:  [name] is a 35 year old Caucasian female who is postpartum day 6 from spontaneous vaginal delivery of her fourth child at 53 weeks' gestation.  The patient was found to have a liver abscess at [facility].  JP drain was placed with drainage of purulent fluid.  The patient was discharged home and developed shortness of breath and lower extremity edema and came to the emergency room.  In the emergency room, she progressed to respiratory failure and was intubated on the 9th.  The patient was found to have increased size of her liver abscess.  She was also found to have pulmonary edema as well as bilateral multilobar pneumonia of unknown etiology.  The patient was switched from Rocephin to vancomycin and Zosyn for broader coverage and ID consult was placed.  Cultures have not grown anything to date.  The patient was transferred to the Intensive Care Unit.  A trial of extubation was made on the 9th, but the patient was again going into respiratory failure, was reintubated on the 10th.  She  was then evaluated on the 11th for cardiomyopathy and possible ischemia of the heart seen on EKG.  The patient had an echocardiogram with ejection fraction of 35%.  She remains intubated, is conversive and alert.  She has normal vitals and is afebrile and continues have an elevated white count, continues to have purulent drainage from her liver abscess JP drain.  PHYSICAL EXAMINATION:  VITALS:  Sinus blood pressure 108/70, heart rate of 89, pulse ox of 90 plus percent, remains intubated. LUNGS:  Ventilator sounds in both lung fields, equal air sounds bilaterally. HEART:  Regular rate and rhythm.  No murmur. ABDOMEN:  Soft, nontender.  Has a JP drain with purulent drainage in the right upper  quadrant with 40 mL overnight drained.  The patient has lower extremity pitting edema.  She is nontender. LOWER EXTREMITIES:  Pulses are palpable. NEUROLOGIC:  She is alert and oriented x3.  ASSESSMENT/PLAN: 1. Postpartum cardiomyopathy with pulmonary edema, doubt ischemic     etiology. 2. Hepatic abscess, large etiology? 3. Adrenal hemorrhage, right. 4. History of pyelonephritis. 5. Polycystic ovary disease? 6. EKG, normal sinus rhythm with poor R-wave progression, cannot rule     out old anterior myocardial infarction or lateral ischemia.  RECOMMENDATIONS:  We will add digoxin 0.25 mg q.4 h. x3 then 0.25 IV daily.  Add low-dose ACE inhibitor, lisinopril 5 mg per day with Coreg 3.125 mg p.o. b.i.d.  Continue Lasix 40 mg IV q.8 h. for 24 hours and q.12 h. until extubated.  May benefit from Aldactone.  Will follow per Dr. Jacinto Halim.    ______________________________ Edd Arbour, MD   ______________________________ Pamella Pert, MD    JO/MEDQ  D:  09/01/2011  T:  09/01/2011  Job:  [id number]  Electronically Signed by Edd Arbour MD on 10/13/2011 02:33:36 PM Electronically Signed by Yates Decamp MD on 10/16/2011 05:48:22 PM

## 2011-10-23 ENCOUNTER — Encounter: Payer: Self-pay | Admitting: Infectious Diseases

## 2011-10-25 ENCOUNTER — Encounter (HOSPITAL_COMMUNITY): Payer: Self-pay

## 2011-10-26 NOTE — Patient Instructions (Signed)
   Your procedure is scheduled on: Tuesday, Nov. 13th   Enter through the Main Entrance of [facility] at: 6:30am Pick up the phone at the desk and dial [phone] and inform us of your arrival.  Please call this number if you have any problems the morning of surgery: [phone]  Remember: Do not eat food after midnight: Monday Do not drink clear liquids after: Monday Take these medicines the morning of surgery with a SIP OF WATER:  Do not wear jewelry, make-up, or FINGER nail polish Do not wear lotions, powders, or perfumes.  You may not  wear deodorant. Do not shave 48 hours prior to surgery. Do not bring valuables to the hospital.  Leave suitcase in the car. After Surgery it may be brought to your room. For patients being admitted to the hospital, checkout time is 11:00am the day of discharge.  Patients discharged on the day of surgery will not be allowed to drive home.     Remember to use your hibiclens as instructed.Please shower with 1/2 bottle the evening before your surgery and the other 1/2 bottle the morning of surgery.

## 2011-10-29 ENCOUNTER — Telehealth (HOSPITAL_COMMUNITY): Payer: Self-pay | Admitting: *Deleted

## 2011-10-29 NOTE — Telephone Encounter (Signed)
Patient left message on 10/27/11 with pre-op that she needed to reschedule her surgery because her child is in the NICU in [place].  I have tried to reach patient 11/6 and 11/8 several times, only getting the answering machine.  Patient needs to be directed to [facility] office [phone] to coordinate a new surgery date.

## 2011-10-30 ENCOUNTER — Inpatient Hospital Stay (HOSPITAL_COMMUNITY): Admission: RE | Admit: 2011-10-30 | Discharge: 2011-10-30 | Payer: Medicaid Other | Source: Ambulatory Visit

## 2011-11-03 ENCOUNTER — Inpatient Hospital Stay (HOSPITAL_COMMUNITY)
Admission: RE | Admit: 2011-11-03 | Payer: Medicaid Other | Source: Ambulatory Visit | Admitting: Obstetrics & Gynecology

## 2011-11-03 ENCOUNTER — Encounter (HOSPITAL_COMMUNITY): Admission: RE | Payer: Self-pay | Source: Ambulatory Visit

## 2011-11-03 SURGERY — LAPAROTOMY, EXPLORATORY
Anesthesia: Choice | Site: Abdomen | Laterality: Right

## 2012-06-28 IMAGING — CT CT ABD-PELV W/ CM
3 of 12 series · 10 of 46 positions shown, 16 images · IV contrast (APPLIED)
Comparison: CT of the abdomen and pelvis performed 08/20/2011, and
chest radiograph performed earlier today at [DATE] a.m.

CTA CHEST

CLINICAL DATA: 3 days postpartum; shortness of breath and
unresponsive. Known liver abscess, status post placement of
drainage catheter 2 days ago.

CT ANGIOGRAPHY CHEST
CT ABDOMEN AND PELVIS WITH CONTRAST
TECHNIQUE: Multidetector CT imaging of the chest was performed
using the standard protocol during bolus administration of
intravenous contrast.  Multiplanar CT image reconstructions
including MIPs were obtained to evaluate the vascular anatomy.
Multidetector CT imaging of the abdomen and pelvis was performed
intravenous contrast.
Contrast: 100 mL of Omnipaque 300 IV contrast

[Series 8: pulm embolism 1.0 thins · axial · 0.71mm/px · z∈[-300,-186]mm · 4 of 284 slices shown]
[im 19/284  soft-tissue]
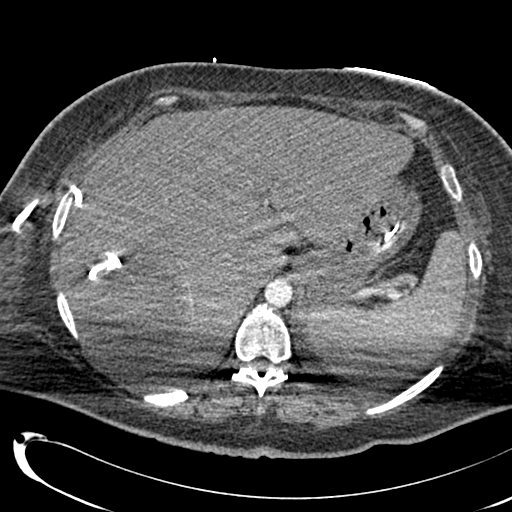
[im 57/284  soft-tissue]
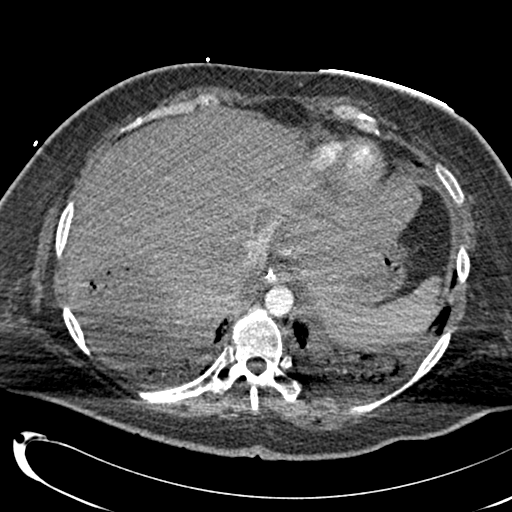
[im 95/284  soft-tissue]
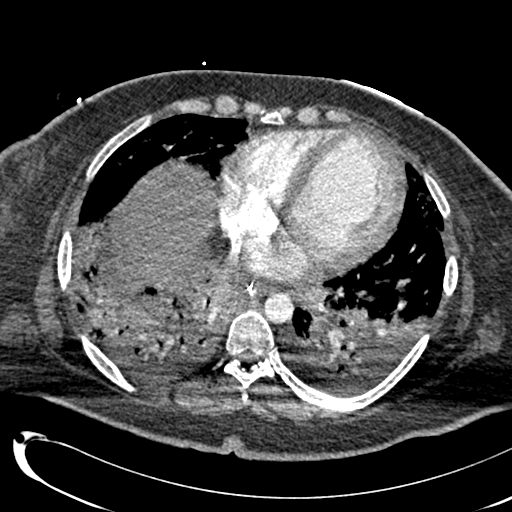
[im 133/284  soft-tissue]
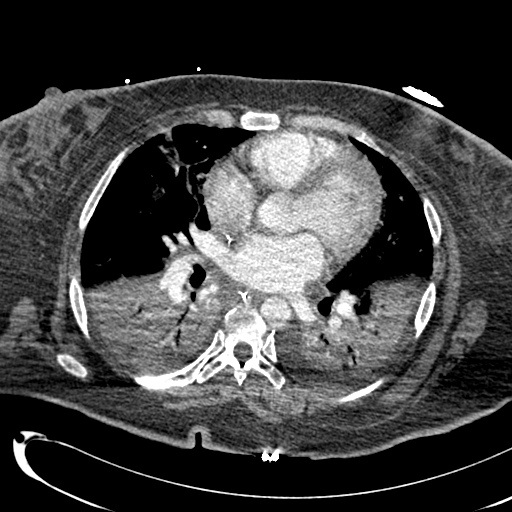

[Series 9: abd/pelv with 5.0 b31f st · axial · 0.89mm/px · z∈[-634,-299]mm · 4 of 113 slices shown, 9 images]
[im 23/113  soft-tissue]
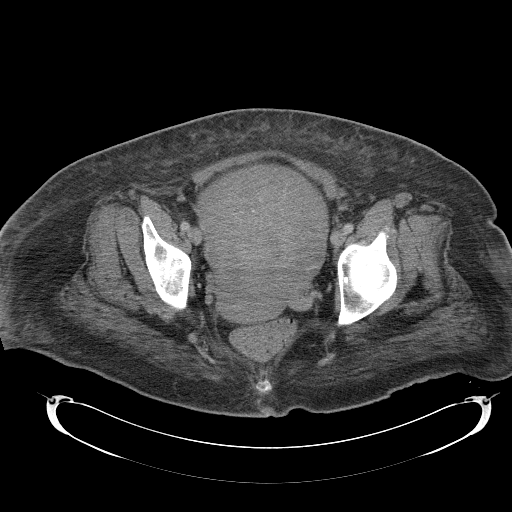
[im 23/113  lung]
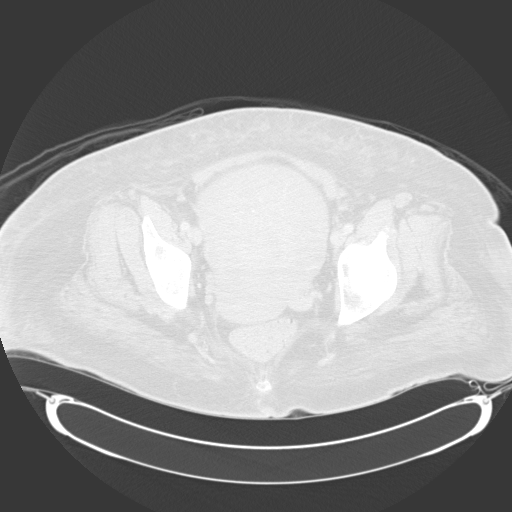
[im 23/113  bone]
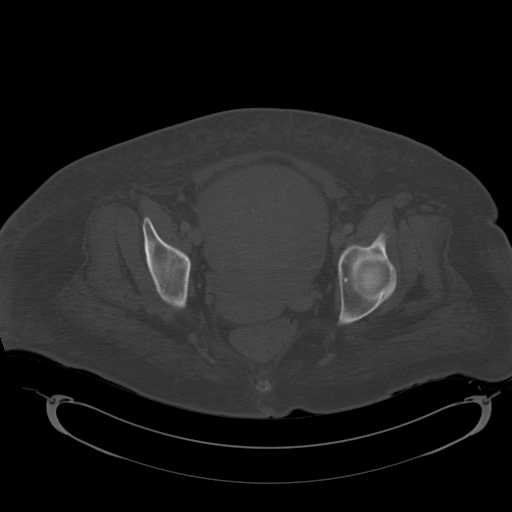
[im 45/113  soft-tissue]
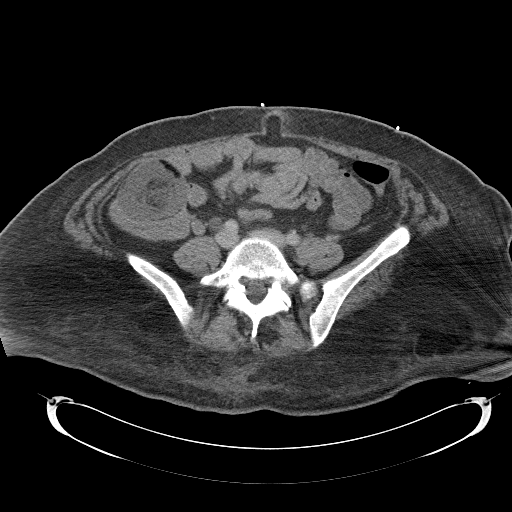
[im 45/113  lung]
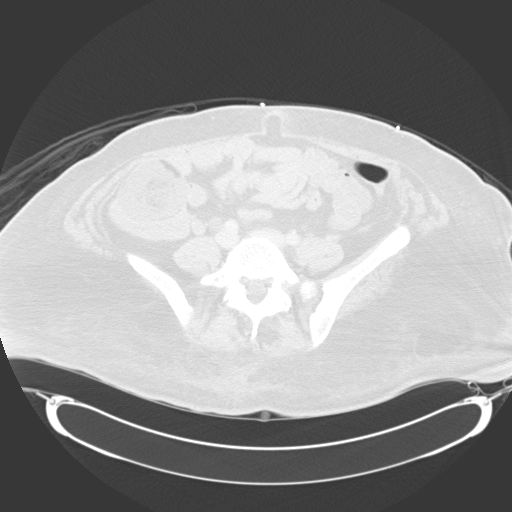
[im 68/113  soft-tissue]
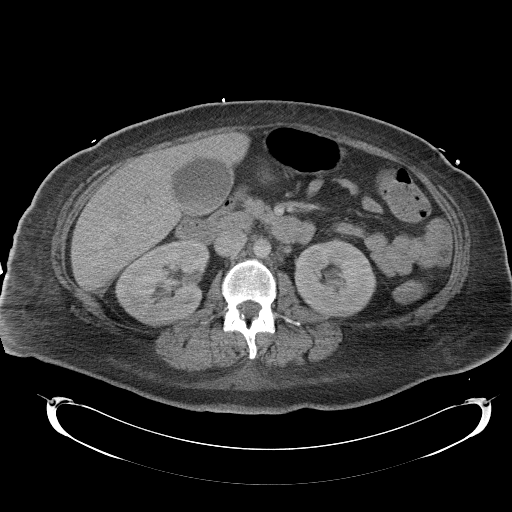
[im 68/113  lung]
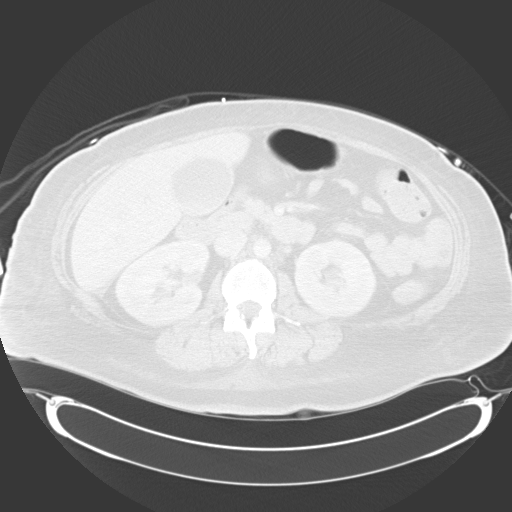
[im 90/113  soft-tissue]
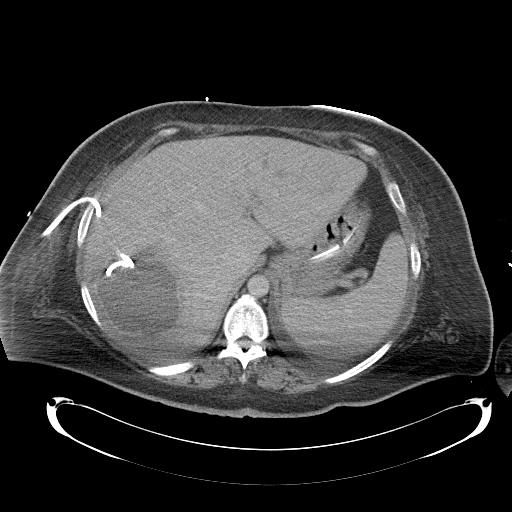
[im 90/113  lung]
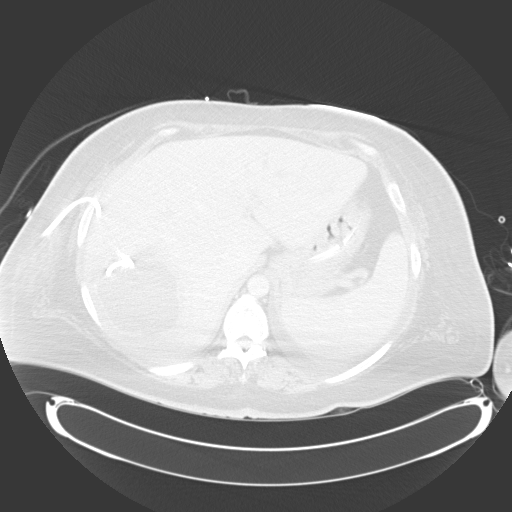

[Series 606: cor · coronal · 1.10mm/px · 2 of 138 slices shown, 3 images]
[im 46/138  soft-tissue]
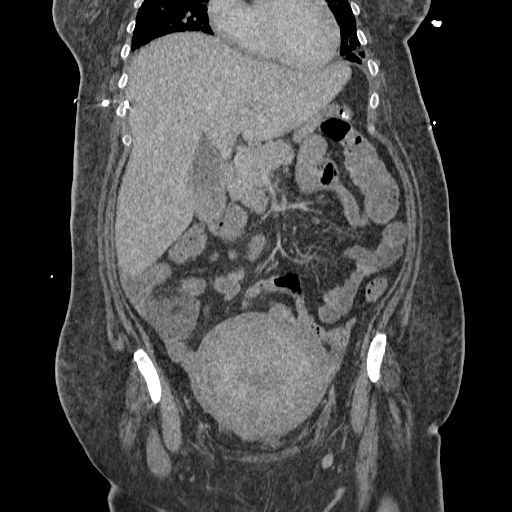
[im 46/138  bone]
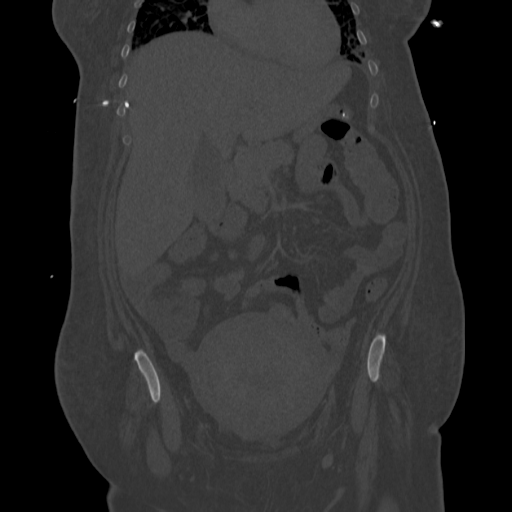
[im 92/138  soft-tissue]
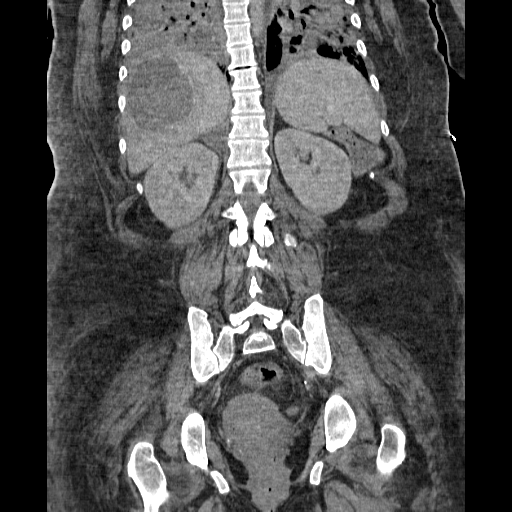

[10 of 46 positions shown; findings below may reference images not displayed]

FINDINGS: There is no evidence of central pulmonary embolus.
Evaluation for pulmonary embolus is significantly suboptimal due to
extensive airspace opacification.

There is diffuse prominence of the interstitium, with hazy
opacification of the expanded portions of both lungs, compatible
with diffuse pulmonary edema.  There is dense consolidation
involving the majority of the left lower lobe and the entirety of
the right lower lobe, which raises concern for infection.

Small bilateral pleural effusions are seen.  There is no evidence
of pneumothorax.  No masses are identified; no abnormal focal
contrast enhancement is seen.

The mediastinum is grossly unremarkable in appearance.  There is no
evidence of pericardial effusion.  The patient's right PICC is
noted ending about the cavoatrial junction.  The great vessels are
unremarkable in appearance.  The patient's endotracheal tube is
seen ending approximately 4 cm above the carina.  No significant
mediastinal lymphadenopathy is seen.  No axillary lymphadenopathy
is seen.  The visualized portions of the thyroid gland are
unremarkable in appearance.

No acute osseous abnormalities are seen.

 Review of the MIP images confirms the above findings.
IMPRESSION: 1.  No evidence of central pulmonary embolus.
2.  Diffuse pulmonary edema, with prominence of the interstitium
and hazy airspace opacification.
3.  Dense consolidation involving the lower lobes bilaterally,
concerning for infection.
4.  Small bilateral pleural effusions seen.

CT ABDOMEN AND PELVIS
FINDINGS: The patient's right-sided hepatic abscess has increased
in size, now measuring approximately 9.0 x 8.0 x 7.7 cm. There is
increased surrounding peripheral enhancement, and trace air in the
abscess, reflecting drainage catheter placement.  The drainage
catheter is noted at the anterior aspect of the abscess; soft
tissue stranding is noted about the course of the drainage catheter
within the lateral abdominal wall.

The liver is otherwise grossly unremarkable.  The spleen is within
normal limits.  Increased attenuation dependently within the
gallbladder may reflect stones or sludge.  The pancreas is
unremarkable in appearance. There is new prominence of the right
adrenal gland, raising question for interval right adrenal
hemorrhage; the right adrenal gland measures 5.2 x 2.6 cm in size.

There is a 1.8 cm cyst noted at the interpole region of the right
kidney.  The kidneys are otherwise unremarkable in appearance.
There is no evidence of hydronephrosis; no renal or ureteral stones
are identified.  No perinephric stranding is seen.

No free fluid is identified.  The small bowel is unremarkable in
appearance.  The stomach is within normal limits; the patient's
enteric tube is noted ending at the body of the stomach.  No acute
vascular abnormalities are seen.

A likely dermoid cyst is again noted at the right lower quadrant,
measuring approximately 6.9 x 6.9 x 5.9 cm, with large areas of
fat.  This was previously identified; the right ovary is not well
assessed on this study.

The appendix is normal in caliber and contains air, without
evidence for appendicitis.  The colon is mildly distended with
stool and is unremarkable in appearance.

The bladder is decompressed, with a Foley catheter in place.  The
uterus is enlarged, reflecting recent postpartum status.  Soft
tissue stranding is noted within the anterior and lateral abdominal
wall, suggestive of mild diffuse anasarca.  No inguinal
lymphadenopathy is seen.

No acute osseous abnormalities are identified.  Chronic bilateral
pars defects are noted at L5.

Review of the MIP images confirms the above findings.
IMPRESSION: 1.  Interval increase in size of hepatic abscess, measuring 9.0 x
8.0 x 7.7 cm.  Drainage catheter noted at the anterior aspect of
the abscess; soft tissue stranding noted along the course of the
drainage catheter within the right lateral abdominal wall.
2.  New right adrenal hemorrhage; the right adrenal gland now
measures 5.2 x 2.6 cm in size.
3.  Likely dermoid cyst again noted at the right lower quadrant,
measuring 6.9 cm in size.
4.  Stones or sludge noted within the gallbladder; no evidence for
acute cholecystitis at this time.
5.  Soft tissue stranding within the anterior and lateral abdominal
walls suggests mild diffuse anasarca.
6.  Chronic bilateral pars defects at L5.
7.  Right renal cyst noted.

Findings were discussed with Dr. Ayalaayustin Jusino at [DATE] a.m. on
08/31/2011.

## 2012-06-30 IMAGING — CR DG CHEST 1V PORT
1 series · 1 of 1 positions shown · non-contrast
Comparison: Chest x-ray 08/31/2011.

CLINICAL DATA: Pneumonia.

PORTABLE CHEST - 1 VIEW

[view not recorded]
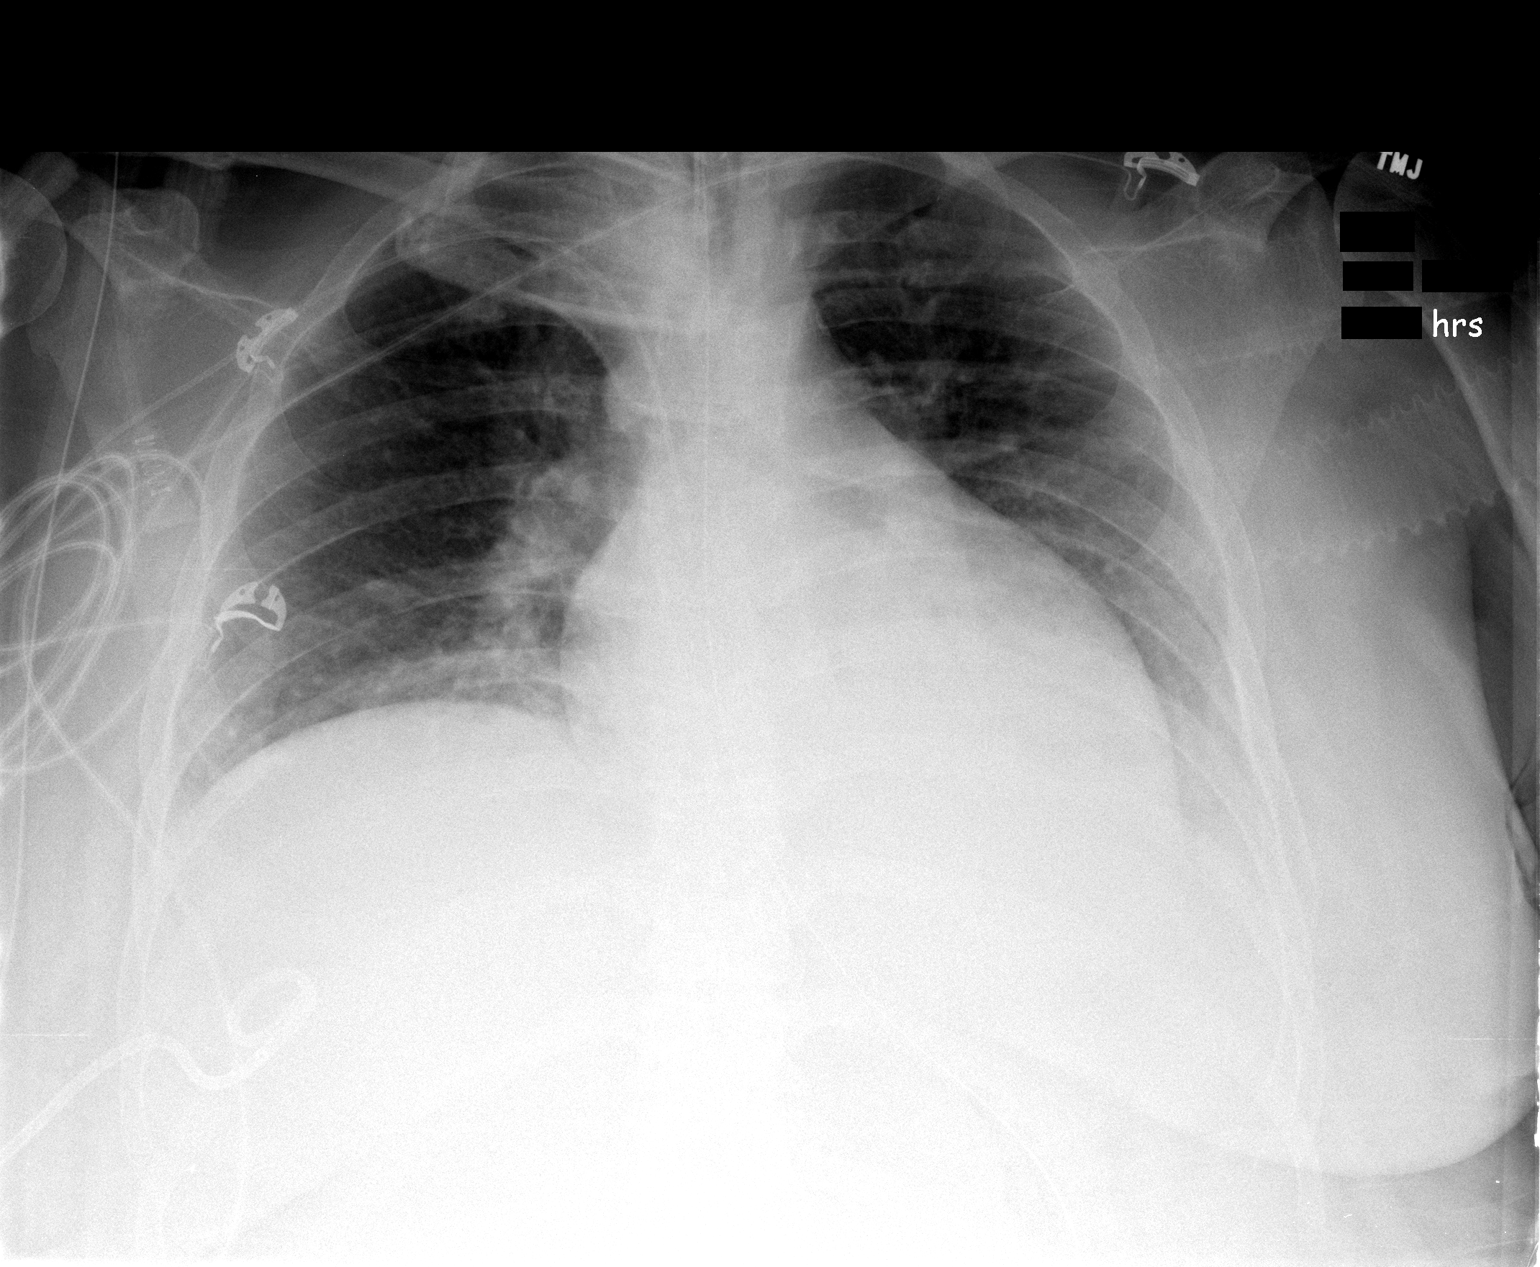

[1 of 1 positions shown; findings below may reference images not displayed]

FINDINGS: The support apparatus is stable.  There is a new NG tube
coursing down into the stomach.  The lungs show much improved
aeration with resolving edema and atelectasis.  There is a
persistent left lower lobe process which could be atelectasis or
infiltrate.
IMPRESSION: 1.  Stable support apparatus with addition of an NG tube.
2.  Much improved lung aeration.

## 2012-07-02 IMAGING — XA IR CATHETER TUBE CHANGE
1 series · 8 of 8 positions shown · non-contrast
Comparison: none

CLINICAL HISTORY: Residual hepatic abscess.  Liver abscess drain is
already in place.

[Series 1: run · 8 of 8 slices shown]
[im 1/8]
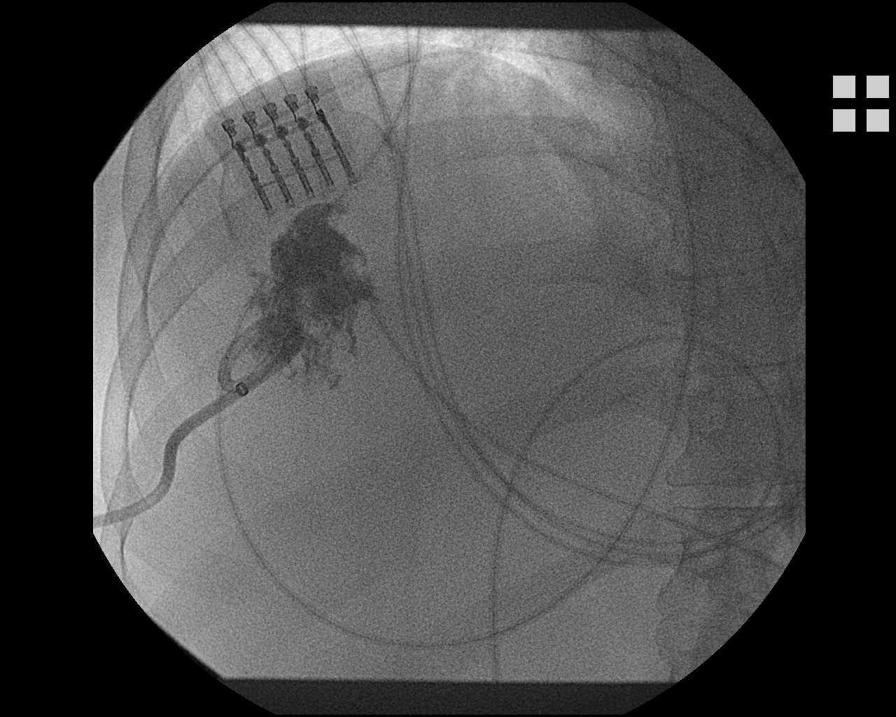
[im 2/8]
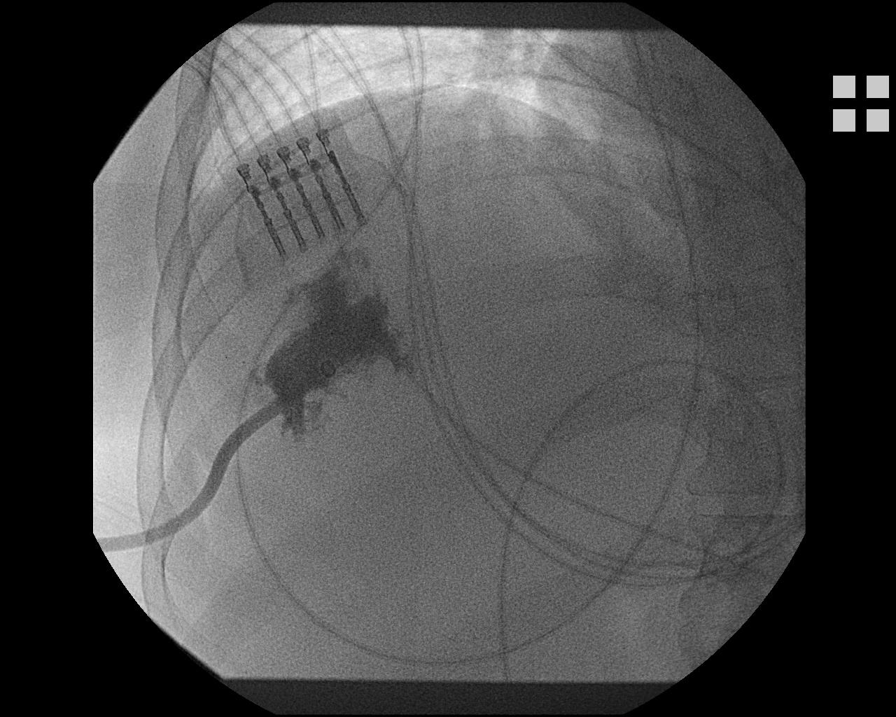
[im 3/8]
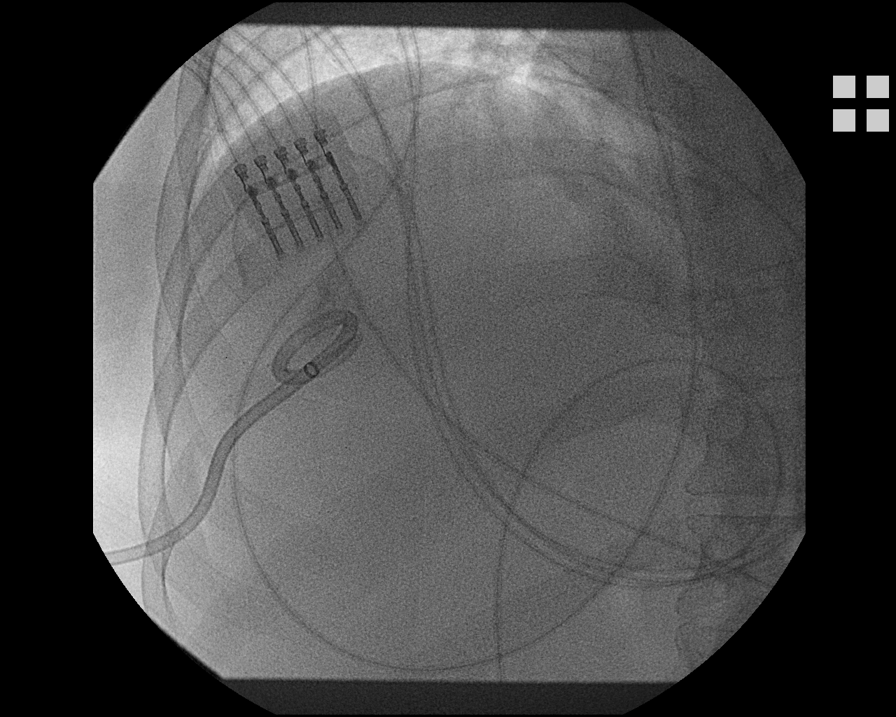
[im 4/8]
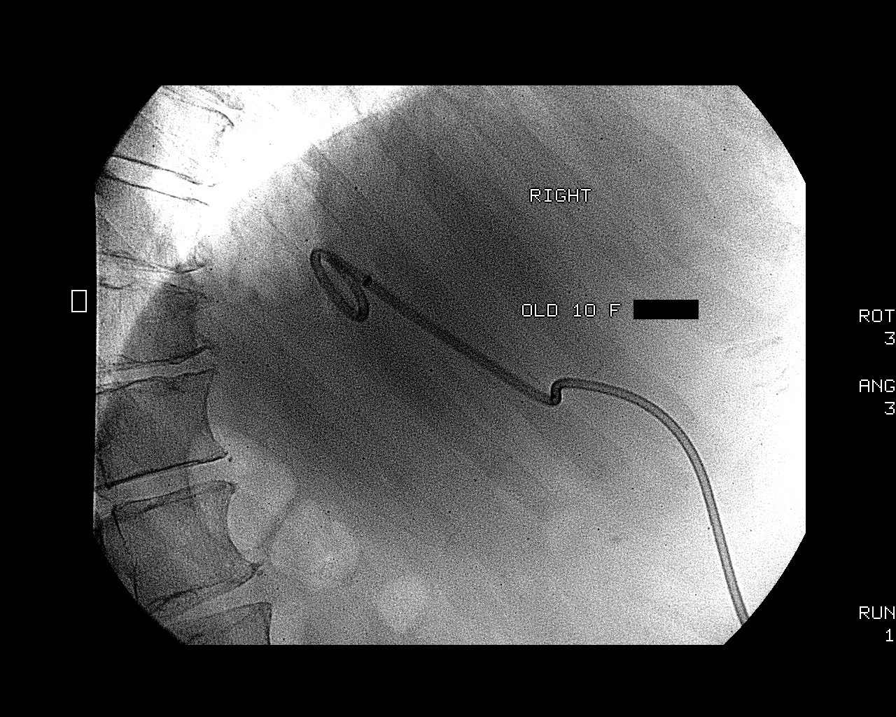
[im 5/8]
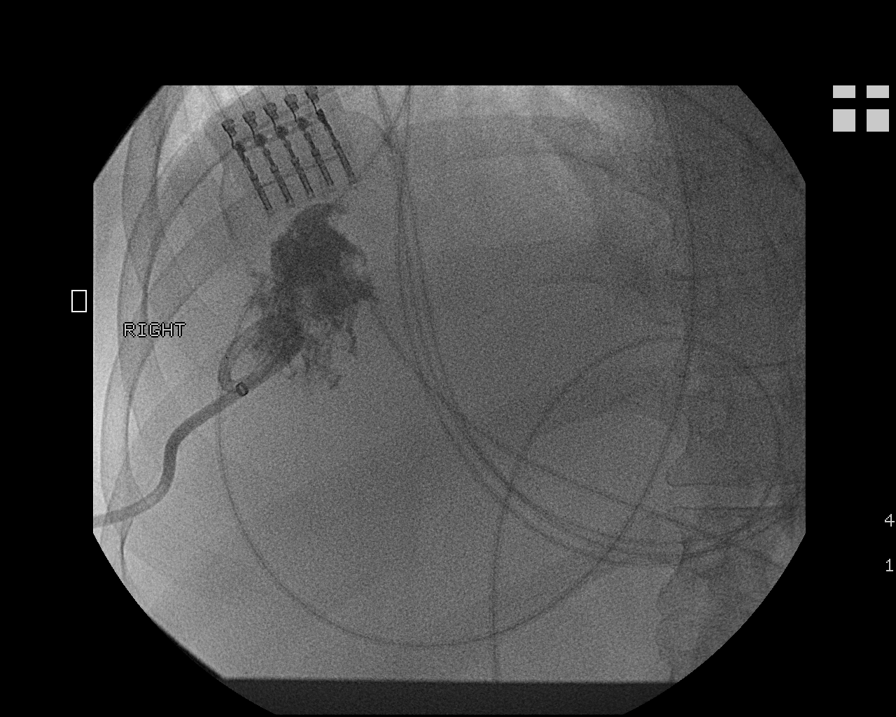
[im 6/8]
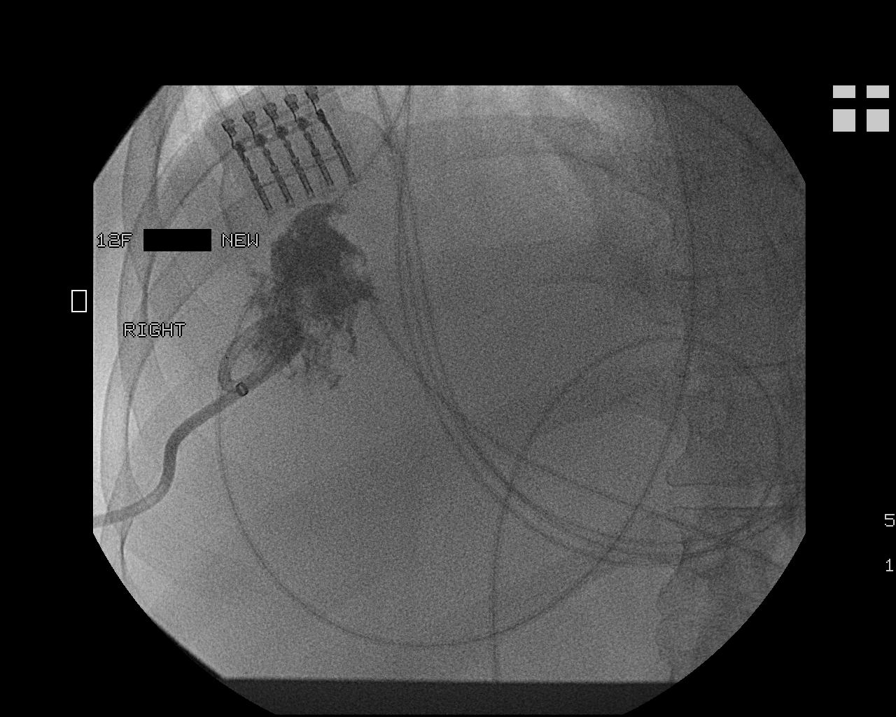
[im 7/8]
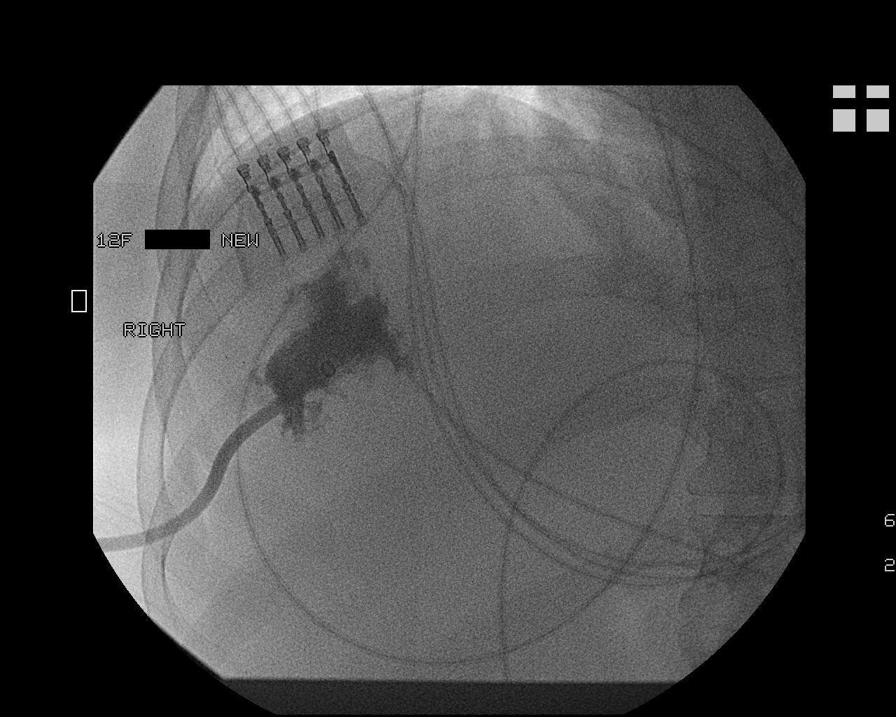
[im 8/8]
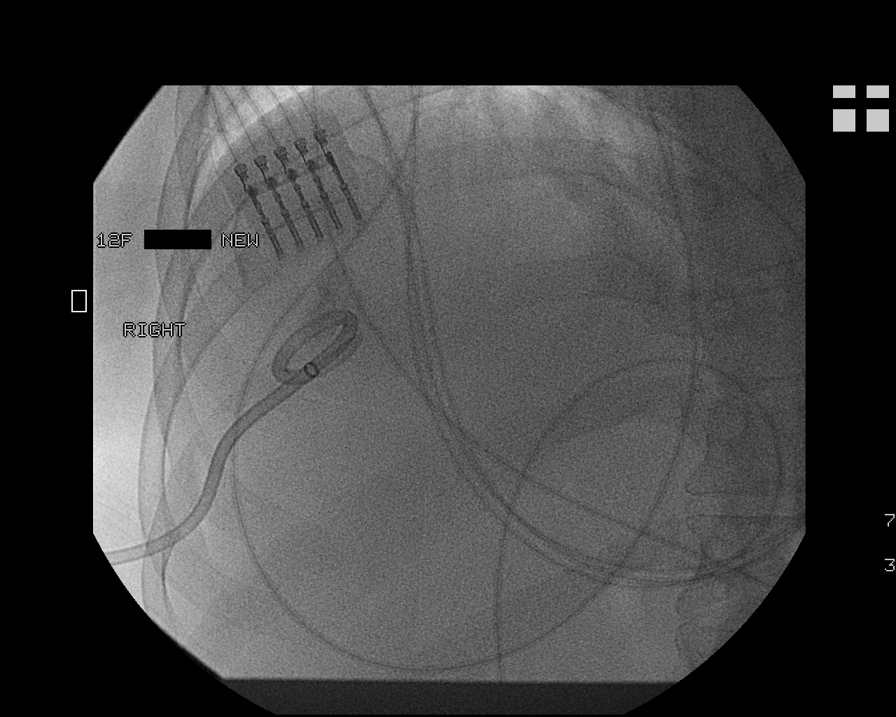

[8 of 8 positions shown; findings below may reference images not displayed]

PROCEDURE(S): INJECTION OF EXISTING DRAINAGE CATHETER; EXCHANGE OF
DRAINAGE CATHETER WITH FLUOROSCOPY

Medications:Fentanyl 100 mcg.

Fluoroscopy time: 2.9 minutes

Procedure:The procedure was explained to the patient.  The risks
and benefits of the procedure were discussed and the patient's
questions were addressed.  Informed consent was obtained from the
patient.  The right side of the abdomen and existing hepatic drain
were prepped and draped in a sterile fashion.  Small amount of
contrast was injected.  The catheter was cut and removed over a
Bentson wire.  A Kumpe catheter was advanced further into the
lesion and a stiff Amplatz wire was placed.  12-French multipurpose
drain was placed over a wire and reconstituted within the hepatic
abscess.  Approximately 75 ml of yellow purulent fluid was removed.
Catheter was attached to a suction bulb and sutured to the
skin.Fluoroscopic images were taken and saved for this procedure.
FINDINGS: Large residual liver abscess.  The new catheter was
positioned within the abscess and 75 ml of purulent fluid was
removed.

Complications: None
IMPRESSION: Successful exchange and repositioning of the hepatic
abscess drain.

## 2014-01-25 ENCOUNTER — Encounter: Payer: Self-pay | Admitting: General Surgery

## 2014-10-22 ENCOUNTER — Encounter: Payer: Self-pay | Admitting: General Surgery

## 2015-02-11 ENCOUNTER — Ambulatory Visit: Payer: Medicaid Other | Admitting: Obstetrics & Gynecology

## 2015-12-22 HISTORY — PX: CORONARY ARTERY BYPASS GRAFT: SHX141

## 2016-01-28 ENCOUNTER — Observation Stay (HOSPITAL_COMMUNITY): Payer: Medicaid Other | Admitting: Certified Registered Nurse Anesthetist

## 2016-01-28 ENCOUNTER — Encounter (HOSPITAL_COMMUNITY): Payer: Self-pay | Admitting: Cardiology

## 2016-01-28 ENCOUNTER — Encounter (HOSPITAL_COMMUNITY)
Admission: EM | Disposition: A | Discharge status: Home / Self Care | Payer: Self-pay | Source: Home / Self Care | Attending: Emergency Medicine

## 2016-01-28 ENCOUNTER — Ambulatory Visit (HOSPITAL_COMMUNITY)
Admission: EM | Admit: 2016-01-28 | Discharge: 2016-01-29 | Disposition: A | Discharge status: Home / Self Care | Payer: Medicaid Other | Attending: General Surgery | Admitting: General Surgery

## 2016-01-28 ENCOUNTER — Emergency Department (HOSPITAL_COMMUNITY): Payer: Medicaid Other

## 2016-01-28 ENCOUNTER — Observation Stay (HOSPITAL_COMMUNITY): Payer: Medicaid Other

## 2016-01-28 DIAGNOSIS — Z833 Family history of diabetes mellitus: Secondary | ICD-10-CM | POA: Diagnosis not present

## 2016-01-28 DIAGNOSIS — K750 Abscess of liver: Secondary | ICD-10-CM | POA: Insufficient documentation

## 2016-01-28 DIAGNOSIS — D72829 Elevated white blood cell count, unspecified: Secondary | ICD-10-CM | POA: Diagnosis not present

## 2016-01-28 DIAGNOSIS — E785 Hyperlipidemia, unspecified: Secondary | ICD-10-CM | POA: Diagnosis not present

## 2016-01-28 DIAGNOSIS — D270 Benign neoplasm of right ovary: Secondary | ICD-10-CM | POA: Insufficient documentation

## 2016-01-28 DIAGNOSIS — K353 Acute appendicitis with localized peritonitis, without perforation or gangrene: Secondary | ICD-10-CM | POA: Diagnosis present

## 2016-01-28 DIAGNOSIS — I429 Cardiomyopathy, unspecified: Secondary | ICD-10-CM | POA: Diagnosis not present

## 2016-01-28 DIAGNOSIS — Z681 Body mass index (BMI) 19 or less, adult: Secondary | ICD-10-CM | POA: Diagnosis not present

## 2016-01-28 DIAGNOSIS — K8020 Calculus of gallbladder without cholecystitis without obstruction: Secondary | ICD-10-CM | POA: Diagnosis not present

## 2016-01-28 DIAGNOSIS — E669 Obesity, unspecified: Secondary | ICD-10-CM | POA: Insufficient documentation

## 2016-01-28 DIAGNOSIS — Z8619 Personal history of other infectious and parasitic diseases: Secondary | ICD-10-CM | POA: Insufficient documentation

## 2016-01-28 DIAGNOSIS — I10 Essential (primary) hypertension: Secondary | ICD-10-CM | POA: Diagnosis not present

## 2016-01-28 DIAGNOSIS — G43909 Migraine, unspecified, not intractable, without status migrainosus: Secondary | ICD-10-CM | POA: Diagnosis not present

## 2016-01-28 DIAGNOSIS — K429 Umbilical hernia without obstruction or gangrene: Secondary | ICD-10-CM | POA: Diagnosis not present

## 2016-01-28 DIAGNOSIS — Z01810 Encounter for preprocedural cardiovascular examination: Secondary | ICD-10-CM | POA: Diagnosis present

## 2016-01-28 DIAGNOSIS — D649 Anemia, unspecified: Secondary | ICD-10-CM | POA: Diagnosis not present

## 2016-01-28 DIAGNOSIS — F17210 Nicotine dependence, cigarettes, uncomplicated: Secondary | ICD-10-CM | POA: Insufficient documentation

## 2016-01-28 DIAGNOSIS — R112 Nausea with vomiting, unspecified: Secondary | ICD-10-CM | POA: Diagnosis not present

## 2016-01-28 DIAGNOSIS — Z794 Long term (current) use of insulin: Secondary | ICD-10-CM | POA: Insufficient documentation

## 2016-01-28 DIAGNOSIS — F329 Major depressive disorder, single episode, unspecified: Secondary | ICD-10-CM | POA: Diagnosis not present

## 2016-01-28 DIAGNOSIS — R918 Other nonspecific abnormal finding of lung field: Secondary | ICD-10-CM | POA: Insufficient documentation

## 2016-01-28 DIAGNOSIS — I700 Atherosclerosis of aorta: Secondary | ICD-10-CM | POA: Insufficient documentation

## 2016-01-28 DIAGNOSIS — E119 Type 2 diabetes mellitus without complications: Secondary | ICD-10-CM | POA: Diagnosis not present

## 2016-01-28 DIAGNOSIS — R1031 Right lower quadrant pain: Secondary | ICD-10-CM | POA: Diagnosis not present

## 2016-01-28 DIAGNOSIS — Z87448 Personal history of other diseases of urinary system: Secondary | ICD-10-CM | POA: Diagnosis not present

## 2016-01-28 DIAGNOSIS — Z8719 Personal history of other diseases of the digestive system: Secondary | ICD-10-CM | POA: Diagnosis not present

## 2016-01-28 DIAGNOSIS — K3580 Unspecified acute appendicitis: Secondary | ICD-10-CM

## 2016-01-28 HISTORY — PX: LAPAROSCOPIC APPENDECTOMY: SHX408

## 2016-01-28 HISTORY — DX: Acute appendicitis with localized peritonitis, without perforation or gangrene: K35.30

## 2016-01-28 HISTORY — DX: Unspecified acute appendicitis: K35.80

## 2016-01-28 LAB — CBC
HEMATOCRIT: 48.8 % — AB (ref 36.0–46.0)
HEMOGLOBIN: 16.3 g/dL — AB (ref 12.0–15.0)
MCH: 27.9 pg (ref 26.0–34.0)
MCHC: 33.4 g/dL (ref 30.0–36.0)
MCV: 83.6 fL (ref 78.0–100.0)
Platelets: 201 10*3/uL (ref 150–400)
RBC: 5.84 MIL/uL — ABNORMAL HIGH (ref 3.87–5.11)
RDW: 13.9 % (ref 11.5–15.5)
WBC: 15.6 10*3/uL — ABNORMAL HIGH (ref 4.0–10.5)

## 2016-01-28 LAB — URINALYSIS, ROUTINE W REFLEX MICROSCOPIC
BILIRUBIN URINE: NEGATIVE
Glucose, UA: >1000 mg/dL — AB
HGB URINE DIPSTICK: NEGATIVE
KETONES UR: NEGATIVE mg/dL
Leukocytes, UA: NEGATIVE
Nitrite: NEGATIVE
PH: 6.5 (ref 5.0–8.0)
Protein, ur: NEGATIVE mg/dL
SPECIFIC GRAVITY, URINE: 1.03 (ref 1.005–1.030)

## 2016-01-28 LAB — COMPREHENSIVE METABOLIC PANEL
ALBUMIN: 3.7 g/dL (ref 3.5–5.0)
ALK PHOS: 54 U/L (ref 38–126)
ALT: 27 U/L (ref 14–54)
ANION GAP: 12 (ref 5–15)
AST: 20 U/L (ref 15–41)
BUN: 9 mg/dL (ref 6–20)
CALCIUM: 9.6 mg/dL (ref 8.9–10.3)
CO2: 25 mmol/L (ref 22–32)
Chloride: 99 mmol/L — ABNORMAL LOW (ref 101–111)
Creatinine, Ser: 0.78 mg/dL (ref 0.44–1.00)
GFR calc Af Amer: >60 mL/min (ref >60)
GFR calc non Af Amer: >60 mL/min (ref >60)
GLUCOSE: 339 mg/dL — AB (ref 65–99)
POTASSIUM: 5.1 mmol/L (ref 3.5–5.1)
SODIUM: 136 mmol/L (ref 135–145)
Total Bilirubin: 0.6 mg/dL (ref 0.3–1.2)
Total Protein: 6.9 g/dL (ref 6.5–8.1)

## 2016-01-28 LAB — GLUCOSE, CAPILLARY
Glucose-Capillary: 210 mg/dL — ABNORMAL HIGH (ref 65–99)
Glucose-Capillary: 229 mg/dL — ABNORMAL HIGH (ref 65–99)

## 2016-01-28 LAB — URINE MICROSCOPIC-ADD ON

## 2016-01-28 LAB — CBG MONITORING, ED
Glucose-Capillary: 311 mg/dL — ABNORMAL HIGH (ref 65–99)
Glucose-Capillary: 248 mg/dL — ABNORMAL HIGH (ref 65–99)

## 2016-01-28 LAB — I-STAT TROPONIN, ED: TROPONIN I, POC: 0 ng/mL (ref 0.00–0.08)

## 2016-01-28 LAB — I-STAT BETA HCG BLOOD, ED (MC, WL, AP ONLY): I-stat hCG, quantitative: <5 m[IU]/mL (ref <5)

## 2016-01-28 LAB — LIPASE, BLOOD: Lipase: 35 U/L (ref 11–51)

## 2016-01-28 SURGERY — APPENDECTOMY, LAPAROSCOPIC
Anesthesia: General

## 2016-01-28 MED ORDER — ONDANSETRON HCL 4 MG/2ML IJ SOLN
4.0000 mg | Freq: Once | INTRAMUSCULAR | Status: AC
  Administered 2016-01-28: 4 mg via INTRAVENOUS
  Filled 2016-01-28: qty 2

## 2016-01-28 MED ORDER — PROPOFOL 10 MG/ML IV BOLUS
INTRAVENOUS | Status: DC | PRN
  Administered 2016-01-28: 200 mg via INTRAVENOUS

## 2016-01-28 MED ORDER — FENTANYL CITRATE (PF) 100 MCG/2ML IJ SOLN
INTRAMUSCULAR | Status: DC | PRN
  Administered 2016-01-28 (×2): 100 ug via INTRAVENOUS

## 2016-01-28 MED ORDER — DOCUSATE SODIUM 100 MG PO CAPS
100.0000 mg | ORAL_CAPSULE | Freq: Two times a day (BID) | ORAL | Status: DC
  Administered 2016-01-29 (×2): 100 mg via ORAL
  Filled 2016-01-28 (×2): qty 1

## 2016-01-28 MED ORDER — INSULIN ASPART 100 UNIT/ML ~~LOC~~ SOLN
0.0000 [IU] | Freq: Three times a day (TID) | SUBCUTANEOUS | Status: DC
  Administered 2016-01-29: 7 [IU] via SUBCUTANEOUS

## 2016-01-28 MED ORDER — ENOXAPARIN SODIUM 40 MG/0.4ML ~~LOC~~ SOLN: 40.0000 mg | SUBCUTANEOUS | Status: DC

## 2016-01-28 MED ORDER — HYDROMORPHONE HCL 1 MG/ML IJ SOLN
INTRAMUSCULAR | Status: AC
  Administered 2016-01-28: 0.5 mg via INTRAVENOUS
  Filled 2016-01-28: qty 1

## 2016-01-28 MED ORDER — DEXTROSE-NACL 5-0.9 % IV SOLN
INTRAVENOUS | Status: DC
  Administered 2016-01-28: 1000 mL via INTRAVENOUS

## 2016-01-28 MED ORDER — CARVEDILOL 6.25 MG PO TABS: 6.2500 mg | ORAL_TABLET | Freq: Two times a day (BID) | ORAL | Status: DC

## 2016-01-28 MED ORDER — INSULIN ASPART 100 UNIT/ML ~~LOC~~ SOLN
0.0000 [IU] | Freq: Three times a day (TID) | SUBCUTANEOUS | Status: DC
  Filled 2016-01-28 (×25): qty 0.15

## 2016-01-28 MED ORDER — INSULIN GLARGINE 100 UNIT/ML ~~LOC~~ SOLN
35.0000 [IU] | Freq: Every day | SUBCUTANEOUS | Status: DC
  Administered 2016-01-29: 35 [IU] via SUBCUTANEOUS
  Filled 2016-01-28 (×2): qty 0.35

## 2016-01-28 MED ORDER — SODIUM CHLORIDE 0.9 % IR SOLN
Status: DC | PRN
Start: 2016-01-28 — End: 2016-01-28
  Administered 2016-01-28: 1000 mL

## 2016-01-28 MED ORDER — LIDOCAINE HCL (PF) 1 % IJ SOLN
INTRAMUSCULAR | Status: AC
  Filled 2016-01-28: qty 30

## 2016-01-28 MED ORDER — SUCCINYLCHOLINE CHLORIDE 20 MG/ML IJ SOLN
INTRAMUSCULAR | Status: DC | PRN
  Administered 2016-01-28: 100 mg via INTRAVENOUS

## 2016-01-28 MED ORDER — PHENYLEPHRINE HCL 10 MG/ML IJ SOLN
INTRAMUSCULAR | Status: DC | PRN
  Administered 2016-01-28 (×3): 120 ug via INTRAVENOUS

## 2016-01-28 MED ORDER — HYDROMORPHONE HCL 1 MG/ML IJ SOLN
1.0000 mg | INTRAMUSCULAR | Status: DC | PRN
  Administered 2016-01-28: 1 mg via INTRAVENOUS
  Filled 2016-01-28: qty 1

## 2016-01-28 MED ORDER — PIPERACILLIN-TAZOBACTAM 3.375 G IVPB
3.3750 g | Freq: Three times a day (TID) | INTRAVENOUS | Status: DC
  Administered 2016-01-29 (×2): 3.375 g via INTRAVENOUS
  Filled 2016-01-28 (×3): qty 50

## 2016-01-28 MED ORDER — IOHEXOL 300 MG/ML  SOLN
100.0000 mL | Freq: Once | INTRAMUSCULAR | Status: AC | PRN
  Administered 2016-01-28: 100 mL via INTRAVENOUS

## 2016-01-28 MED ORDER — OXYCODONE HCL 5 MG/5ML PO SOLN: 5.0000 mg | Freq: Once | ORAL | Status: DC | PRN

## 2016-01-28 MED ORDER — ACETAMINOPHEN 325 MG PO TABS: 650.0000 mg | ORAL_TABLET | Freq: Four times a day (QID) | ORAL | Status: DC | PRN

## 2016-01-28 MED ORDER — PRAVASTATIN SODIUM 40 MG PO TABS: 40.0000 mg | ORAL_TABLET | Freq: Every day | ORAL | Status: DC

## 2016-01-28 MED ORDER — SIMETHICONE 80 MG PO CHEW: 40.0000 mg | CHEWABLE_TABLET | Freq: Four times a day (QID) | ORAL | Status: DC | PRN

## 2016-01-28 MED ORDER — BUPIVACAINE-EPINEPHRINE (PF) 0.25% -1:200000 IJ SOLN
INTRAMUSCULAR | Status: AC
  Filled 2016-01-28: qty 30

## 2016-01-28 MED ORDER — ONDANSETRON 4 MG PO TBDP: 4.0000 mg | ORAL_TABLET | Freq: Four times a day (QID) | ORAL | Status: DC | PRN

## 2016-01-28 MED ORDER — ENOXAPARIN SODIUM 40 MG/0.4ML ~~LOC~~ SOLN
40.0000 mg | SUBCUTANEOUS | Status: DC
  Administered 2016-01-29: 40 mg via SUBCUTANEOUS
  Filled 2016-01-28: qty 0.4

## 2016-01-28 MED ORDER — MORPHINE SULFATE (PF) 4 MG/ML IV SOLN
4.0000 mg | Freq: Once | INTRAVENOUS | Status: AC
  Administered 2016-01-28: 4 mg via INTRAVENOUS
  Filled 2016-01-28: qty 1

## 2016-01-28 MED ORDER — ONDANSETRON HCL 4 MG/2ML IJ SOLN: 4.0000 mg | Freq: Four times a day (QID) | INTRAMUSCULAR | Status: DC | PRN

## 2016-01-28 MED ORDER — ACETAMINOPHEN 500 MG PO TABS
1000.0000 mg | ORAL_TABLET | Freq: Four times a day (QID) | ORAL | Status: DC
  Administered 2016-01-29 (×2): 1000 mg via ORAL
  Filled 2016-01-28 (×2): qty 2

## 2016-01-28 MED ORDER — IBUPROFEN 200 MG PO TABS: 600.0000 mg | ORAL_TABLET | Freq: Four times a day (QID) | ORAL | Status: DC | PRN

## 2016-01-28 MED ORDER — DIPHENHYDRAMINE HCL 25 MG PO CAPS: 25.0000 mg | ORAL_CAPSULE | Freq: Four times a day (QID) | ORAL | Status: DC | PRN

## 2016-01-28 MED ORDER — METRONIDAZOLE IN NACL 5-0.79 MG/ML-% IV SOLN
500.0000 mg | Freq: Three times a day (TID) | INTRAVENOUS | Status: DC
  Administered 2016-01-28 – 2016-01-29 (×2): 500 mg via INTRAVENOUS
  Filled 2016-01-28 (×3): qty 100

## 2016-01-28 MED ORDER — MIDAZOLAM HCL 2 MG/2ML IJ SOLN
INTRAMUSCULAR | Status: AC
  Filled 2016-01-28: qty 2

## 2016-01-28 MED ORDER — MIDAZOLAM HCL 5 MG/5ML IJ SOLN
INTRAMUSCULAR | Status: DC | PRN
  Administered 2016-01-28: 2 mg via INTRAVENOUS

## 2016-01-28 MED ORDER — OXYCODONE HCL 5 MG PO TABS
5.0000 mg | ORAL_TABLET | ORAL | Status: DC | PRN
  Administered 2016-01-29: 5 mg via ORAL
  Filled 2016-01-28: qty 1

## 2016-01-28 MED ORDER — SODIUM CHLORIDE 0.9 % IV BOLUS (SEPSIS)
1000.0000 mL | Freq: Once | INTRAVENOUS | Status: AC
  Administered 2016-01-28: 1000 mL via INTRAVENOUS

## 2016-01-28 MED ORDER — LISINOPRIL 5 MG PO TABS: 5.0000 mg | ORAL_TABLET | Freq: Every day | ORAL | Status: DC

## 2016-01-28 MED ORDER — METHOCARBAMOL 500 MG PO TABS
500.0000 mg | ORAL_TABLET | Freq: Four times a day (QID) | ORAL | Status: DC | PRN
  Administered 2016-01-29: 500 mg via ORAL
  Filled 2016-01-28: qty 1

## 2016-01-28 MED ORDER — SUGAMMADEX SODIUM 200 MG/2ML IV SOLN
INTRAVENOUS | Status: AC
Start: 2016-01-28 — End: 2016-01-28
  Filled 2016-01-28: qty 2

## 2016-01-28 MED ORDER — DIPHENHYDRAMINE HCL 50 MG/ML IJ SOLN: 25.0000 mg | Freq: Four times a day (QID) | INTRAMUSCULAR | Status: DC | PRN

## 2016-01-28 MED ORDER — BISACODYL 10 MG RE SUPP: 10.0000 mg | Freq: Every day | RECTAL | Status: DC | PRN

## 2016-01-28 MED ORDER — OXYCODONE HCL 5 MG PO TABS: 5.0000 mg | ORAL_TABLET | Freq: Once | ORAL | Status: DC | PRN

## 2016-01-28 MED ORDER — BUPIVACAINE-EPINEPHRINE (PF) 0.25% -1:200000 IJ SOLN
INTRAMUSCULAR | Status: DC | PRN
  Administered 2016-01-28: 7 mL via INTRAMUSCULAR

## 2016-01-28 MED ORDER — LACTATED RINGERS IV SOLN
INTRAVENOUS | Status: DC
  Administered 2016-01-28 (×2): via INTRAVENOUS

## 2016-01-28 MED ORDER — PROPOFOL 10 MG/ML IV BOLUS
INTRAVENOUS | Status: AC
  Filled 2016-01-28: qty 20

## 2016-01-28 MED ORDER — 0.9 % SODIUM CHLORIDE (POUR BTL) OPTIME
TOPICAL | Status: DC | PRN
  Administered 2016-01-28: 1000 mL

## 2016-01-28 MED ORDER — DIGOXIN 125 MCG PO TABS
125.0000 ug | ORAL_TABLET | Freq: Every day | ORAL | Status: DC
  Filled 2016-01-28: qty 1

## 2016-01-28 MED ORDER — HYDROMORPHONE HCL 1 MG/ML IJ SOLN: 0.5000 mg | INTRAMUSCULAR | Status: DC | PRN

## 2016-01-28 MED ORDER — LIDOCAINE HCL (CARDIAC) 20 MG/ML IV SOLN
INTRAVENOUS | Status: DC | PRN
  Administered 2016-01-28: 50 mg via INTRATRACHEAL
  Administered 2016-01-28: 50 mg via INTRAVENOUS

## 2016-01-28 MED ORDER — SUCCINYLCHOLINE CHLORIDE 20 MG/ML IJ SOLN
INTRAMUSCULAR | Status: AC
  Filled 2016-01-28: qty 1

## 2016-01-28 MED ORDER — OXYCODONE-ACETAMINOPHEN 5-325 MG PO TABS
1.0000 | ORAL_TABLET | ORAL | Status: DC | PRN
  Administered 2016-01-29: 1 via ORAL
  Filled 2016-01-28: qty 1

## 2016-01-28 MED ORDER — ACETAMINOPHEN 650 MG RE SUPP: 650.0000 mg | Freq: Four times a day (QID) | RECTAL | Status: DC | PRN

## 2016-01-28 MED ORDER — METFORMIN HCL ER 500 MG PO TB24: 1000.0000 mg | ORAL_TABLET | Freq: Two times a day (BID) | ORAL | Status: DC

## 2016-01-28 MED ORDER — HYDROMORPHONE HCL 1 MG/ML IJ SOLN
0.2500 mg | INTRAMUSCULAR | Status: DC | PRN
  Administered 2016-01-28 (×2): 0.5 mg via INTRAVENOUS

## 2016-01-28 MED ORDER — FENTANYL CITRATE (PF) 250 MCG/5ML IJ SOLN
INTRAMUSCULAR | Status: AC
  Filled 2016-01-28: qty 5

## 2016-01-28 MED ORDER — DEXTROSE 5 % IV SOLN
2.0000 g | INTRAVENOUS | Status: AC
  Administered 2016-01-28: 2 g via INTRAVENOUS
  Filled 2016-01-28 (×2): qty 2

## 2016-01-28 MED ORDER — SUGAMMADEX SODIUM 200 MG/2ML IV SOLN
INTRAVENOUS | Status: DC | PRN
  Administered 2016-01-28: 200 mg via INTRAVENOUS

## 2016-01-28 MED ORDER — ROCURONIUM BROMIDE 100 MG/10ML IV SOLN
INTRAVENOUS | Status: DC | PRN
  Administered 2016-01-28: 50 mg via INTRAVENOUS

## 2016-01-28 SURGICAL SUPPLY — 39 items
APPLIER CLIP ROT 10 11.4 M/L (STAPLE)
BLADE SURG ROTATE 9660 (MISCELLANEOUS) IMPLANT
CANISTER SUCTION 2500CC (MISCELLANEOUS) ×3 IMPLANT
CHLORAPREP W/TINT 26ML (MISCELLANEOUS) ×3 IMPLANT
CLIP APPLIE ROT 10 11.4 M/L (STAPLE) IMPLANT
COVER SURGICAL LIGHT HANDLE (MISCELLANEOUS) ×3 IMPLANT
CUTTER FLEX LINEAR 45M (STAPLE) ×3 IMPLANT
DERMABOND ADVANCED (GAUZE/BANDAGES/DRESSINGS) ×2
DERMABOND ADVANCED .7 DNX12 (GAUZE/BANDAGES/DRESSINGS) ×1 IMPLANT
DRAPE WARM FLUID 44X44 (DRAPE) ×3 IMPLANT
ELECT REM PT RETURN 9FT ADLT (ELECTROSURGICAL) ×3
ELECTRODE REM PT RTRN 9FT ADLT (ELECTROSURGICAL) ×1 IMPLANT
ENDOLOOP SUT PDS II  0 18 (SUTURE)
ENDOLOOP SUT PDS II 0 18 (SUTURE) IMPLANT
GLOVE BIO SURGEON STRL SZ 6 (GLOVE) ×3 IMPLANT
GLOVE BIOGEL PI IND STRL 6.5 (GLOVE) ×1 IMPLANT
GLOVE BIOGEL PI INDICATOR 6.5 (GLOVE) ×2
GOWN STRL REUS W/ TWL LRG LVL3 (GOWN DISPOSABLE) ×2 IMPLANT
GOWN STRL REUS W/TWL 2XL LVL3 (GOWN DISPOSABLE) ×3 IMPLANT
GOWN STRL REUS W/TWL LRG LVL3 (GOWN DISPOSABLE) ×4
KIT BASIN OR (CUSTOM PROCEDURE TRAY) ×3 IMPLANT
KIT ROOM TURNOVER OR (KITS) ×3 IMPLANT
LIQUID BAND (GAUZE/BANDAGES/DRESSINGS) ×3 IMPLANT
NS IRRIG 1000ML POUR BTL (IV SOLUTION) ×3 IMPLANT
PAD ARMBOARD 7.5X6 YLW CONV (MISCELLANEOUS) ×6 IMPLANT
POUCH SPECIMEN RETRIEVAL 10MM (ENDOMECHANICALS) ×3 IMPLANT
RELOAD STAPLE TA45 3.5 REG BLU (ENDOMECHANICALS) ×3 IMPLANT
SCALPEL HARMONIC ACE (MISCELLANEOUS) ×3 IMPLANT
SET IRRIG TUBING LAPAROSCOPIC (IRRIGATION / IRRIGATOR) ×3 IMPLANT
SLEEVE ENDOPATH XCEL 5M (ENDOMECHANICALS) ×3 IMPLANT
SPECIMEN JAR SMALL (MISCELLANEOUS) ×3 IMPLANT
SUT MNCRL AB 4-0 PS2 18 (SUTURE) ×3 IMPLANT
TOWEL OR 17X24 6PK STRL BLUE (TOWEL DISPOSABLE) ×3 IMPLANT
TOWEL OR 17X26 10 PK STRL BLUE (TOWEL DISPOSABLE) ×3 IMPLANT
TRAY FOLEY CATH 16FR SILVER (SET/KITS/TRAYS/PACK) ×3 IMPLANT
TRAY LAPAROSCOPIC MC (CUSTOM PROCEDURE TRAY) ×3 IMPLANT
TROCAR XCEL BLUNT TIP 100MML (ENDOMECHANICALS) ×3 IMPLANT
TROCAR XCEL NON-BLD 5MMX100MML (ENDOMECHANICALS) ×3 IMPLANT
TUBING INSUFFLATION (TUBING) ×3 IMPLANT

## 2016-01-28 NOTE — Op Note (Signed)
Appendectomy, Lap, Procedure Note  Indications: The patient presented with a history of right-sided abdominal pain. A CT revealed findings consistent with acute appendicitis.  Pre-operative Diagnosis: Acute appendicitis without mention of peritonitis  Post-operative Diagnosis: Same  Surgeon: [facility]   Anesthesia: General endotracheal anesthesia and Local anesthesia 1% plain lidocaine, 0.25.% bupivacaine, with epinephrine  ASA Class: 2  Procedure Details  The patient was seen again in the Holding Room. The risks, benefits, complications, treatment options, and expected outcomes were discussed with the patient and/or family. The possibilities of perforation of viscus, bleeding, recurrent infection, the need for additional procedures, failure to diagnose a condition, and creating a complication requiring transfusion or operation were discussed. There was concurrence with the proposed plan and informed consent was obtained. The site of surgery was properly noted. The patient was taken to Operating Room, identified as Mendy Alperin and the procedure verified as Appendectomy. A Time Out was held and the above information confirmed.  The patient was placed in the supine position and general anesthesia was induced, along with placement of orogastric tube, Venodyne boots, and a Foley catheter. The abdomen was prepped and draped in a sterile fashion. Local anesthetic was infiltrated in the infraumbilical region.  A 1.5 cm curvilinear transverse incision was made just below the umbilicus.  The Kelly clamp was used to spread the subcutaneous tissues.  The fascia was elevated with 2 Kocher clamps and incised with the #11 blade.  A Claiborne Billings was used to confirm entrance into the peritoneal cavity.  A pursestring suture was placed around the fascial incision.  The Hasson trocar was inserted into the abdomen and held in place with the tails of the suture.  The pneumoperitoneum was then established to steady  pressure of 15 mmHg.     Additional 5 mm cannulas then placed in the left upper quadrant of the abdomen and the upper midline region under direct visualization.  A careful evaluation of the entire abdomen was carried out. The patient was placed in Trendelenburg and rotated to the left.  The small intestines were retracted in the cephalad and left lateral direction away from the pelvis and right lower quadrant. The patient was found to have an enlarged and inflamed appendix that was partially retrocecal. There was no evidence of perforation.  The appendix was carefully dissected. The appendix was was skeletonized with the harmonic scalpel.   The appendix was divided at its base using an endo-GIA stapler. Minimal appendiceal stump was left in place. The appendix was removed from the abdomen with an Endocatch bag through the left subcostal port.  There was no evidence of bleeding, leakage, or complication after division of the appendix. Irrigation was also performed and irrigate suctioned from the abdomen as well.   The dermoid cyst was intact in the pelvis at the end of the case. The 5 mm trocars were removed.  The pneumoperitoneum was evacuated from the abdomen.    The trocar site skin wounds were closed with 4-0 Monocryl and dressed with Dermabond.  Instrument, sponge, and needle counts were correct at the conclusion of the case.   Findings: The appendix was found to be inflamed. There were signs of necrosis.  There was not perforation. There was not abscess formation.  10 cm cyst seen in the pelvis.    Estimated Blood Loss:  Minimal            Specimens: appendix to pathology         Complications:  None; patient tolerated the  procedure well.         Disposition: PACU - hemodynamically stable.         Condition: stable

## 2016-01-28 NOTE — ED Notes (Signed)
OR called and states they are ready for the pt. RN to call report to [phone] and transport pt [place] [facility] in short stay.

## 2016-01-28 NOTE — ED Notes (Signed)
Pt states she is unable to provide a urine specimen at this time.

## 2016-01-28 NOTE — ED Notes (Signed)
Pt to xray

## 2016-01-28 NOTE — ED Provider Notes (Signed)
CSN: [id number]     Arrival date & time 01/28/16  [id number] History   First MD Initiated Contact with Patient 01/28/16 0935     Chief Complaint  Patient presents with  . Abdominal Pain     (Consider location/radiation/quality/duration/timing/severity/associated sxs/prior Treatment) HPI  Pt presenting with c/o right lower quadrnat pain.  Pain started last night at 3am.  Pt states movement makes pain worse.  She has had a few episodes of vomiting associated.  No change in stools.  No dysuria.  Has had some chills, no known actual fever.  She was in normal state of health yesterday.  Pain is constant and sharp.  She has not had any treatment prior to arrival.  There are no other associated systemic symptoms, there are no other alleviating or modifying factors.   Past Medical History  Diagnosis Date  . Diabetes mellitus   . Anemia   . Depression   . Migraine   . Abnormal Pap smear   . Tobacco abuse    Past Surgical History  Procedure Laterality Date  . Tonsillectomy    . Cryotherapy    . Dilation and curettage of uterus  1994  . Laparoscopic appendectomy N/A 01/28/2016    Procedure: APPENDECTOMY LAPAROSCOPIC;  Surgeon: Stark Klein, MD;  Location: [facility] OR;  Service: General;  Laterality: N/A;   Family History  Problem Relation Age of Onset  . Diabetes Maternal Grandmother   . Diabetes Paternal Grandfather    Social History  Substance Use Topics  . Smoking status: Current Every Day Smoker -- 0.50 packs/day for 21 years    Types: Cigarettes  . Smokeless tobacco: Never Used  . Alcohol Use: No   OB History    Gravida Para Term Preterm AB TAB SAB Ectopic Multiple Living   5 4 3 1 1  1   4      Review of Systems  ROS reviewed and all otherwise negative except for mentioned in HPI    Allergies  Review of patient's allergies indicates no known allergies.  Home Medications   Prior to Admission medications   Medication Sig Start Date End Date Taking? Authorizing Provider  Insulin  Glargine (LANTUS SOLOSTAR) 100 UNIT/ML Solostar Pen Inject 44 Units into the skin daily at 10 pm.   Yes Historical Provider, MD  lisinopril (PRINIVIL,ZESTRIL) 5 MG tablet Take 5 mg by mouth daily.   Yes Historical Provider, MD  pravastatin (PRAVACHOL) 40 MG tablet Take 40 mg by mouth daily.   Yes Historical Provider, MD  carvedilol (COREG) 6.25 MG tablet Take 6.25 mg by mouth 2 (two) times daily with a meal.      Historical Provider, MD  digoxin (LANOXIN) 0.125 MG tablet Take 125 mcg by mouth daily.      Historical Provider, MD  metFORMIN (GLUCOPHAGE-XR) 500 MG 24 hr tablet Take 2 tablets (1,000 mg total) by mouth 2 (two) times daily. 01/30/16   Emina Riebock, NP  oxyCODONE-acetaminophen (PERCOCET/ROXICET) 5-325 MG tablet Take 1-2 tablets by mouth every 4 (four) hours as needed for moderate pain. 01/29/16   Emina Riebock, NP   BP 92/68 mmHg  Pulse 95  Temp(Src) 98.4 F (36.9 C) (Oral)  Resp 20  Ht 5\' 4"  (1.626 m)  Wt 51.755 kg  BMI 19.58 kg/m2  SpO2 98%  LMP 01/22/2016  Vitals reviewed Physical Exam  Physical Examination: General appearance - alert, well appearing, and in no distress Mental status - alert, oriented to person, place, and time Eyes - no conjunctival  injection no scleral icterus Mouth - mucous membranes moist, pharynx normal without lesions Chest - clear to auscultation, no wheezes, rales or rhonchi, symmetric air entry Heart - normal rate, regular rhythm, normal S1, S2, no murmurs, rubs, clicks or gallops Abdomen - soft, ttp in right lower abdomen, + rovsings sign, nabs, no gaurding or rebound tenderness, nondistended, no masses or organomegaly Neurological - alert, oriented, normal speech, no focal findings Extremities - peripheral pulses normal, no pedal edema, no clubbing or cyanosis Skin - normal coloration and turgor, no rashes  ED Course  Procedures (including critical care time) Labs Review Labs Reviewed  COMPREHENSIVE METABOLIC PANEL - Abnormal; Notable for the  following:    Chloride 99 (*)    Glucose, Bld 339 (*)    All other components within normal limits  CBC - Abnormal; Notable for the following:    WBC 15.6 (*)    RBC 5.84 (*)    Hemoglobin 16.3 (*)    HCT 48.8 (*)    All other components within normal limits  URINALYSIS, ROUTINE W REFLEX MICROSCOPIC (NOT AT [facility]) - Abnormal; Notable for the following:    APPearance CLOUDY (*)    Glucose, UA >1000 (*)    All other components within normal limits  URINE MICROSCOPIC-ADD ON - Abnormal; Notable for the following:    Squamous Epithelial / LPF 0-5 (*)    Bacteria, UA FEW (*)    All other components within normal limits  GLUCOSE, CAPILLARY - Abnormal; Notable for the following:    Glucose-Capillary 210 (*)    All other components within normal limits  CBC - Abnormal; Notable for the following:    WBC 14.5 (*)    All other components within normal limits  BASIC METABOLIC PANEL - Abnormal; Notable for the following:    Glucose, Bld 251 (*)    Calcium 8.5 (*)    All other components within normal limits  GLUCOSE, CAPILLARY - Abnormal; Notable for the following:    Glucose-Capillary 229 (*)    All other components within normal limits  GLUCOSE, CAPILLARY - Abnormal; Notable for the following:    Glucose-Capillary 238 (*)    All other components within normal limits  CBG MONITORING, ED - Abnormal; Notable for the following:    Glucose-Capillary 311 (*)    All other components within normal limits  CBG MONITORING, ED - Abnormal; Notable for the following:    Glucose-Capillary 248 (*)    All other components within normal limits  LIPASE, BLOOD  I-STAT BETA HCG BLOOD, ED ([facility], [facility], [facility] ONLY)  I-STAT TROPOININ, ED  SURGICAL PATHOLOGY    Imaging Review No results found. I have personally reviewed and evaluated these images and lab results as part of my medical decision-making.   EKG Interpretation   Date/Time:  Tuesday January 28 2016 13:40:06 EST Ventricular Rate:  88 PR Interval:   164 QRS Duration: 96 QT Interval:  362 QTC Calculation: 438 R Axis:   80 Text Interpretation:  Sinus rhythm Borderline low voltage, extremity leads  Nonspecific T abnormalities, lateral leads Baseline wander in lead(s) V1  Since previous tracing t wave inversions no longer present in anterior  leads Confirmed by [facility]  MD, MARTHA [phone]) on 01/28/2016 2:23:13 PM      MDM   Final diagnoses:  Acute appendicitis, unspecified acute appendicitis type    Pt presenting with c/o right lower abdomen pain.  Pt has leukocytosis, CT abdomen shows acute appendicitis. Pt treated with IV fluids, morphine,  zofran, she is NPO.  Surgery consulted.   12:46 PM d/w surgery, they will see patient in the ED.    Alfonzo Beers, MD 02/01/16 [phone]

## 2016-01-28 NOTE — Interval H&P Note (Signed)
History and Physical Interval Note:  01/28/2016 5:07 PM  [name]  has presented today for surgery, with the diagnosis of Acute Appendicitis  The various methods of treatment have been discussed with the patient and family. After consideration of risks, benefits and other options for treatment, the patient has consented to  Procedure(s): APPENDECTOMY LAPAROSCOPIC (N/A) as a surgical intervention .  The patient's history has been reviewed, patient examined, no change in status, stable for surgery.  I have reviewed the patient's chart and labs.  Questions were answered to the patient's satisfaction.     Keith Cancio

## 2016-01-28 NOTE — Anesthesia Preprocedure Evaluation (Addendum)
Anesthesia Evaluation  Patient identified by MRN, date of birth, ID band Patient awake    Reviewed: Allergy & Precautions, NPO status , Patient's Chart, lab work & pertinent test results  Airway Mallampati: II   Neck ROM: full    Dental  (+) Dental Advisory Given, Poor Dentition,    Pulmonary Current Smoker,    breath sounds clear to auscultation       Cardiovascular negative cardio ROS   Rhythm:regular Rate:Normal     Neuro/Psych  Headaches, Depression    GI/Hepatic   Endo/Other  diabetes, Type 2obese  Renal/GU      Musculoskeletal   Abdominal   Peds  Hematology   Anesthesia Other Findings   Reproductive/Obstetrics                            Anesthesia Physical Anesthesia Plan  ASA: II  Anesthesia Plan: General   Post-op Pain Management:    Induction: Intravenous  Airway Management Planned: Oral ETT  Additional Equipment:   Intra-op Plan:   Post-operative Plan: Extubation in OR  Informed Consent: I have reviewed the patients History and Physical, chart, labs and discussed the procedure including the risks, benefits and alternatives for the proposed anesthesia with the patient or authorized representative who has indicated his/her understanding and acceptance.     Plan Discussed with: CRNA, Anesthesiologist and Surgeon  Anesthesia Plan Comments:         Anesthesia Quick Evaluation

## 2016-01-28 NOTE — Transfer of Care (Signed)
Immediate Anesthesia Transfer of Care Note  Patient: [name]  Procedure(s) Performed: Procedure(s): APPENDECTOMY LAPAROSCOPIC (N/A)  Patient Location: PACU  Anesthesia Type:General  Level of Consciousness: awake, alert , oriented and patient cooperative  Airway & Oxygen Therapy: Patient Spontanous Breathing and Patient connected to face mask oxygen  Post-op Assessment: Report given to RN, Post -op Vital signs reviewed and stable, Patient moving all extremities and Patient moving all extremities X 4  Post vital signs: Reviewed and stable  Last Vitals:  Filed Vitals:   01/28/16 1430 01/28/16 1500  BP: 124/81 119/83  Pulse: 92 95  Temp:    Resp:      Complications: No apparent anesthesia complications

## 2016-01-28 NOTE — Anesthesia Postprocedure Evaluation (Signed)
Anesthesia Post Note  Patient: [name]  Procedure(s) Performed: Procedure(s) (LRB): APPENDECTOMY LAPAROSCOPIC (N/A)  Patient location during evaluation: PACU Anesthesia Type: General Level of consciousness: awake and alert Pain management: pain level controlled Vital Signs Assessment: post-procedure vital signs reviewed and stable Respiratory status: spontaneous breathing, nonlabored ventilation, respiratory function stable and patient connected to nasal cannula oxygen Cardiovascular status: blood pressure returned to baseline and stable Postop Assessment: no signs of nausea or vomiting Anesthetic complications: no    Last Vitals:  Filed Vitals:   01/28/16 1500 01/28/16 1835  BP: 119/83 121/78  Pulse: 95   Temp:    Resp:      Last Pain:  Filed Vitals:   01/28/16 1856  PainSc: 0-No pain                 Reatha Sur S

## 2016-01-28 NOTE — ED Notes (Signed)
Pt returns from xray

## 2016-01-28 NOTE — ED Notes (Signed)
Pt reports right lower quadrant pain that started this morning when she woke up. States she has also had a couple episodes of n/v. Called her PCP and told to come here.

## 2016-01-28 NOTE — Anesthesia Procedure Notes (Signed)
Procedure Name: Intubation Date/Time: 01/28/2016 5:25 PM Performed by: Shirlyn Goltz Pre-anesthesia Checklist: Patient identified, Emergency Drugs available, Suction available and Patient being monitored Patient Re-evaluated:Patient Re-evaluated prior to inductionOxygen Delivery Method: Circle system utilized Preoxygenation: Pre-oxygenation with 100% oxygen Intubation Type: IV induction, Rapid sequence and Cricoid Pressure applied Laryngoscope Size: Mac and 3 Grade View: Grade I Tube type: Oral Tube size: 7.0 mm Number of attempts: 1 Placement Confirmation: ETT inserted through vocal cords under direct vision,  positive ETCO2 and breath sounds checked- equal and bilateral Secured at: 21 cm Tube secured with: Tape Dental Injury: Teeth and Oropharynx as per pre-operative assessment

## 2016-01-28 NOTE — H&P (Signed)
Chief Complaint: RLQ abdominal pain HPI: [name] is a 40 year old female with a history of DM II, HTN, HLD, remote history of cardiomyopathy, acute renal failure and hepatic abscess post partum in 2012.  She has since been cleared by cardiology and no longer follows up.  She presents today with RLQ abdominal pain which started suddenly at 3AM this morning.  Associated with nausea, vomiting, chills and anorexia.  Denies diarrhea.  Denies fevers.  Modifying factors include; heating pad.  Aggravated by movement.  Alleviated with rest.  Last oral intake was at 0800.  Work up shows, WBC 15.6k, normal renal function, normal UA, CT of abdomen and pelvis consistent with acute appendicitis.  Also noted an enlarged right ovarian dermoid.  She has not seen GYN in 2012/13, but is aware and cancelled surgery with infant being at NICU at [facility].  We have been asked to evaluate for appendicitis.   Past Medical History  Diagnosis Date  . Diabetes mellitus   . Anemia   . Depression   . Migraine   . Abnormal Pap smear   . Tobacco abuse     Past Surgical History  Procedure Laterality Date  . Tonsillectomy    . Cryotherapy    . Dilation and curettage of uterus  1994    Family History  Problem Relation Age of Onset  . Diabetes Maternal Grandmother   . Diabetes Paternal Grandfather    Social History:  reports that she has been smoking Cigarettes.  She has a 10.5 pack-year smoking history. She has never used smokeless tobacco. She reports that she does not drink alcohol or use illicit drugs.  Allergies: No Known Allergies   (Not in a hospital admission)  Results for orders placed or performed during the hospital encounter of 01/28/16 (from the past 48 hour(s))  Lipase, blood     Status: None   Collection Time: 01/28/16  9:07 AM  Result Value Ref Range   Lipase 35 11 - 51 U/L  Comprehensive metabolic panel     Status: Abnormal   Collection Time: 01/28/16  9:07 AM  Result Value Ref Range   Sodium 136 135 - 145 mmol/L   Potassium 5.1 3.5 - 5.1 mmol/L   Chloride 99 (L) 101 - 111 mmol/L   CO2 25 22 - 32 mmol/L   Glucose, Bld 339 (H) 65 - 99 mg/dL   BUN 9 6 - 20 mg/dL   Creatinine, Ser 3.80 0.44 - 1.00 mg/dL   Calcium 9.6 8.9 - 64.8 mg/dL   Total Protein 6.9 6.5 - 8.1 g/dL   Albumin 3.7 3.5 - 5.0 g/dL   AST 20 15 - 41 U/L   ALT 27 14 - 54 U/L   Alkaline Phosphatase 54 38 - 126 U/L   Total Bilirubin 0.6 0.3 - 1.2 mg/dL   GFR calc non Af Amer >60 >60 mL/min   GFR calc Af Amer >60 >60 mL/min    Comment: (NOTE) The eGFR has been calculated using the CKD EPI equation. This calculation has not been validated in all clinical situations. eGFR's persistently <60 mL/min signify possible Chronic Kidney Disease.    Anion gap 12 5 - 15  CBC     Status: Abnormal   Collection Time: 01/28/16  9:07 AM  Result Value Ref Range   WBC 15.6 (H) 4.0 - 10.5 K/uL   RBC 5.84 (H) 3.87 - 5.11 MIL/uL   Hemoglobin 16.3 (H) 12.0 - 15.0 g/dL   HCT 47.5 (H)  36.0 - 46.0 %   MCV 83.6 78.0 - 100.0 fL   MCH 27.9 26.0 - 34.0 pg   MCHC 33.4 30.0 - 36.0 g/dL   RDW 13.9 11.5 - 15.5 %   Platelets 201 150 - 400 K/uL  I-Stat beta hCG blood, ED ([facility], [facility], [facility] only)     Status: None   Collection Time: 01/28/16  9:15 AM  Result Value Ref Range   I-stat hCG, quantitative <5.0 <5 mIU/mL   Comment 3            Comment:   GEST. AGE      CONC.  (mIU/mL)   <=1 WEEK        5 - 50     2 WEEKS       50 - 500     3 WEEKS       100 - 10,000     4 WEEKS     1,000 - 30,000        FEMALE AND NON-PREGNANT FEMALE:     LESS THAN 5 mIU/mL   I-stat troponin, ED     Status: None   Collection Time: 01/28/16  9:37 AM  Result Value Ref Range   Troponin i, poc 0.00 0.00 - 0.08 ng/mL   Comment 3            Comment: Due to the release kinetics of cTnI, a negative result within the first hours of the onset of symptoms does not rule out myocardial infarction with certainty. If myocardial infarction is still suspected, repeat  the test at appropriate intervals.   CBG monitoring, ED     Status: Abnormal   Collection Time: 01/28/16  9:49 AM  Result Value Ref Range   Glucose-Capillary 311 (H) 65 - 99 mg/dL  Urinalysis, Routine w reflex microscopic (not at [facility])     Status: Abnormal   Collection Time: 01/28/16 11:15 AM  Result Value Ref Range   Color, Urine YELLOW YELLOW   APPearance CLOUDY (A) CLEAR   Specific Gravity, Urine 1.030 1.005 - 1.030   pH 6.5 5.0 - 8.0   Glucose, UA >1000 (A) NEGATIVE mg/dL   Hgb urine dipstick NEGATIVE NEGATIVE   Bilirubin Urine NEGATIVE NEGATIVE   Ketones, ur NEGATIVE NEGATIVE mg/dL   Protein, ur NEGATIVE NEGATIVE mg/dL   Nitrite NEGATIVE NEGATIVE   Leukocytes, UA NEGATIVE NEGATIVE  Urine microscopic-add on     Status: Abnormal   Collection Time: 01/28/16 11:15 AM  Result Value Ref Range   Squamous Epithelial / LPF 0-5 (A) NONE SEEN   WBC, UA 0-5 0 - 5 WBC/hpf   RBC / HPF 0-5 0 - 5 RBC/hpf   Bacteria, UA FEW (A) NONE SEEN  CBG monitoring, ED     Status: Abnormal   Collection Time: 01/28/16 12:20 PM  Result Value Ref Range   Glucose-Capillary 248 (H) 65 - 99 mg/dL   Ct Abdomen Pelvis W Contrast  01/28/2016  CLINICAL DATA:  40 year old female with right lower quadrant pain acute onset this morning. Initial encounter. EXAM: CT ABDOMEN AND PELVIS WITH CONTRAST TECHNIQUE: Multidetector CT imaging of the abdomen and pelvis was performed using the standard protocol following bolus administration of intravenous contrast. CONTRAST:  166m OMNIPAQUE IOHEXOL 300 MG/ML  SOLN COMPARISON:  CT Abdomen and Pelvis 10/01/2011 and earlier. FINDINGS: Mild respiratory motion artifact at the lung bases. Mildly asymmetric dependent lower lobe opacity greater on the right. No associated pleural effusion. No pericardial effusion. A small sclerotic  focus in the T6 vertebral body is chronic and has only mildly enlarged since 2012. Degenerative changes in the spine. Chronic L5 pars fractures with stable mild  grade 1 anterolisthesis at L5-S1. No acute osseous abnormality identified. Enlarged ovarian dermoid on the right side measuring up to 10.3 cm diameter now. This is located superior to the uterus. Negative noncontrast uterus and left ovary. No pelvic free fluid. Diminutive urinary bladder. Decompressed rectum. Negative sigmoid colon. Decompressed left colon. Mildly redundant but otherwise negative transverse colon and flexures. The appendix is enlarged, indistinct, and inflamed today. This despite the tip of the appendix still containing some gas. It measures 12 mm diameter at most levels. Inflammation is most pronounced along the midportion of the appendix. Trace reactive appearing adjacent free fluid. The terminal ileum is spared. The cecum otherwise does not appear inflamed. No dilated small bowel. Small fat containing umbilical hernia is unchanged. Negative stomach and duodenum. Hepatic steatosis. Small posterior right lobe cystic area at the side of previous percutaneous liver abscess drain. Cholelithiasis. No CT evidence of acute cholecystitis. Spleen, pancreas, and adrenal glands are within normal limits. Portal venous system is patent. Bilateral renal enhancement and contrast excretion appears normal aside from small round right midpole 10 mm low-density area which is stable since 2012. Aortoiliac calcified atherosclerosis noted. Major arterial structures are patent. No lymphadenopathy. No free air in the abdomen. IMPRESSION: 1. Positive for acute appendicitis. Trace reactive appearing right lower quadrant free fluid with otherwise no adverse features. See also # 2. 2. Enlarged right ovarian dermoid located along the superior margin of the uterus. This now measures up to 10.2 cm diameter. It might be prudent to surgically address this lesion at the same time this the appendix. 3. Resolved right liver abscess with tiny residual cyst. Fatty liver disease. Cholelithiasis. 4. Calcified aortic atherosclerosis.  Electronically Signed   By: Genevie Ann M.D.   On: 01/28/2016 12:06    Review of Systems  Constitutional: Positive for chills and malaise/fatigue. Negative for fever, weight loss and diaphoresis.  Eyes: Negative for blurred vision, double vision, photophobia, pain, discharge and redness.  Respiratory: Negative for cough, hemoptysis, sputum production, shortness of breath and wheezing.   Cardiovascular: Negative for chest pain, palpitations, orthopnea, claudication, leg swelling and PND.  Gastrointestinal: Positive for nausea, vomiting and abdominal pain. Negative for heartburn, diarrhea, constipation, blood in stool and melena.  Genitourinary: Negative for dysuria, urgency, frequency, hematuria and flank pain.  Musculoskeletal: Negative for myalgias, back pain, joint pain, falls and neck pain.  Neurological: Negative for dizziness, tingling, tremors, sensory change, speech change, focal weakness, seizures, loss of consciousness, weakness and headaches.    Blood pressure 117/79, pulse 81, temperature 98.6 F (37 C), temperature source Oral, resp. rate 16, height '5\' 7"'$  (1.702 m), weight 106.142 kg (234 lb), last menstrual period 01/22/2016, SpO2 95 %, currently breastfeeding. Physical Exam  Constitutional: She is oriented to person, place, and time. She appears well-developed and well-nourished. No distress.  HENT:  Head: Normocephalic and atraumatic.  Mouth/Throat: No oropharyngeal exudate.  Cardiovascular: Normal rate, regular rhythm and intact distal pulses.  Exam reveals no gallop and no friction rub.   No murmur heard. Respiratory: Effort normal and breath sounds normal. No respiratory distress. She has no wheezes. She has no rales. She exhibits no tenderness.  GI: Soft. Bowel sounds are normal.  TTP RLQ  Musculoskeletal: Normal range of motion. She exhibits no edema or tenderness.  Neurological: She is alert and oriented to person, place, and  time.  Skin: Skin is warm and dry. No rash  noted. She is not diaphoretic. No erythema.  Psychiatric: She has a normal mood and affect. Her behavior is normal. Judgment and thought content normal.     Assessment/Plan Acute appendicitis-to OR later today for a laparoscopic appendectomy.  Surgical risks discussed including infection, bleeding, injury to surrounding structures, open surgery, heart attack, blood clots, respiratory compromise.  The patient verbalizes understanding and wishes to proceed.  Will check a pre op EKG and CXR with tobacco history. ID-rocephin/flagyl FEN-NPO, IVF, pain control DM II-SSI, lantus, hold metformin HTN-home meds Right ovarian dermoid(10.2cm)-will need OP GYN follow up   Erby Pian, NP  Pager [phone] 01/28/2016, 1:26 PM

## 2016-01-29 ENCOUNTER — Encounter (HOSPITAL_COMMUNITY): Payer: Self-pay | Admitting: General Surgery

## 2016-01-29 LAB — BASIC METABOLIC PANEL
Anion gap: 10 (ref 5–15)
BUN: 7 mg/dL (ref 6–20)
CHLORIDE: 103 mmol/L (ref 101–111)
CO2: 25 mmol/L (ref 22–32)
Calcium: 8.5 mg/dL — ABNORMAL LOW (ref 8.9–10.3)
Creatinine, Ser: 0.76 mg/dL (ref 0.44–1.00)
GFR calc Af Amer: >60 mL/min (ref >60)
GFR calc non Af Amer: >60 mL/min (ref >60)
GLUCOSE: 251 mg/dL — AB (ref 65–99)
POTASSIUM: 4.1 mmol/L (ref 3.5–5.1)
Sodium: 138 mmol/L (ref 135–145)

## 2016-01-29 LAB — GLUCOSE, CAPILLARY: GLUCOSE-CAPILLARY: 238 mg/dL — AB (ref 65–99)

## 2016-01-29 LAB — CBC
HCT: 39.8 % (ref 36.0–46.0)
HEMOGLOBIN: 12.8 g/dL (ref 12.0–15.0)
MCH: 27.3 pg (ref 26.0–34.0)
MCHC: 32.2 g/dL (ref 30.0–36.0)
MCV: 84.9 fL (ref 78.0–100.0)
Platelets: 180 10*3/uL (ref 150–400)
RBC: 4.69 MIL/uL (ref 3.87–5.11)
RDW: 14.2 % (ref 11.5–15.5)
WBC: 14.5 10*3/uL — ABNORMAL HIGH (ref 4.0–10.5)

## 2016-01-29 MED ORDER — METFORMIN HCL ER 500 MG PO TB24: 1000.0000 mg | ORAL_TABLET | Freq: Two times a day (BID) | ORAL | Status: DC

## 2016-01-29 MED ORDER — INFLUENZA VAC SPLIT QUAD 0.5 ML IM SUSY: 0.5000 mL | PREFILLED_SYRINGE | INTRAMUSCULAR | Status: DC

## 2016-01-29 MED ORDER — OXYCODONE-ACETAMINOPHEN 5-325 MG PO TABS: 1.0000 | ORAL_TABLET | ORAL | Status: DC | PRN

## 2016-01-29 NOTE — Progress Notes (Addendum)
Discharge instructions reviewed with patient. RX given. All questions answered at this time. Awaiting for transportation from family.   Ave Filter, RN

## 2016-01-29 NOTE — Discharge Summary (Signed)
Physician Discharge Summary  Patient ID: [name] MRN: [id number] DOB/AGE: 02-25-1976 40 y.o.  Admit date: 01/28/2016 Discharge date: 01/29/2016  Admitting Diagnosis: Acute appendicitis Right ovarian dermoid cyst HTN Diabetes mellitus   Discharge Diagnosis Patient Active Problem List   Diagnosis Date Noted  . Acute appendicitis 01/28/2016  . Acute appendicitis with localized peritonitis 01/28/2016  . Renal failure 09/25/2011  . SIRS due to infectious process with acute organ dysfunction ([place]) 09/18/2011  . Obesity 09/18/2011  . Diabetes mellitus type II 08/22/2011  . Liver abscess 08/20/2011  . 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/20/2011    Consultants none  Imaging: Dg Chest 2 View  01/28/2016  CLINICAL DATA:  40 year old female preoperative study for appendectomy. Initial encounter. EXAM: CHEST  2 VIEW COMPARISON:  CT Abdomen and Pelvis from today reported separately. Chest radiographs 09/02/2011 and earlier. FINDINGS: Chronically low lung volumes. Normal cardiac size and mediastinal contours. Visualized tracheal air column is within normal limits. Bilateral increased interstitial markings appear chronic. There is superimposed mild atelectasis or scarring in the right middle lobe. No pneumothorax, pulmonary edema, pleural effusion or confluent pulmonary opacity. No acute osseous abnormality identified. IMPRESSION: Low lung volumes.  No acute cardiopulmonary abnormality. Electronically Signed   By: Genevie Ann M.D.   On: 01/28/2016 14:05   Ct Abdomen Pelvis W Contrast  01/28/2016  CLINICAL DATA:  40 year old female with right lower quadrant pain acute onset this morning. Initial encounter. EXAM: CT ABDOMEN AND PELVIS WITH CONTRAST TECHNIQUE: Multidetector CT imaging of the abdomen and pelvis was performed using the standard protocol following bolus administration of intravenous contrast. CONTRAST:  155mL OMNIPAQUE IOHEXOL 300 MG/ML  SOLN COMPARISON:  CT Abdomen and Pelvis 10/01/2011 and  earlier. FINDINGS: Mild respiratory motion artifact at the lung bases. Mildly asymmetric dependent lower lobe opacity greater on the right. No associated pleural effusion. No pericardial effusion. A small sclerotic focus in the T6 vertebral body is chronic and has only mildly enlarged since 2012. Degenerative changes in the spine. Chronic L5 pars fractures with stable mild grade 1 anterolisthesis at L5-S1. No acute osseous abnormality identified. Enlarged ovarian dermoid on the right side measuring up to 10.3 cm diameter now. This is located superior to the uterus. Negative noncontrast uterus and left ovary. No pelvic free fluid. Diminutive urinary bladder. Decompressed rectum. Negative sigmoid colon. Decompressed left colon. Mildly redundant but otherwise negative transverse colon and flexures. The appendix is enlarged, indistinct, and inflamed today. This despite the tip of the appendix still containing some gas. It measures 12 mm diameter at most levels. Inflammation is most pronounced along the midportion of the appendix. Trace reactive appearing adjacent free fluid. The terminal ileum is spared. The cecum otherwise does not appear inflamed. No dilated small bowel. Small fat containing umbilical hernia is unchanged. Negative stomach and duodenum. Hepatic steatosis. Small posterior right lobe cystic area at the side of previous percutaneous liver abscess drain. Cholelithiasis. No CT evidence of acute cholecystitis. Spleen, pancreas, and adrenal glands are within normal limits. Portal venous system is patent. Bilateral renal enhancement and contrast excretion appears normal aside from small round right midpole 10 mm low-density area which is stable since 2012. Aortoiliac calcified atherosclerosis noted. Major arterial structures are patent. No lymphadenopathy. No free air in the abdomen. IMPRESSION: 1. Positive for acute appendicitis. Trace reactive appearing right lower quadrant free fluid with otherwise no  adverse features. See also # 2. 2. Enlarged right ovarian dermoid located along the superior margin of the uterus. This now measures up to  10.2 cm diameter. It might be prudent to surgically address this lesion at the same time this the appendix. 3. Resolved right liver abscess with tiny residual cyst. Fatty liver disease. Cholelithiasis. 4. Calcified aortic atherosclerosis. Electronically Signed   By: Genevie Ann M.D.   On: 01/28/2016 12:06    Procedures Laparoscopic appendectomy---Dr. Haydee Monica Course:  [name] is a 40 year old female with a history of DM II, HTN, HLD, remote history of cardiomyopathy, acute renal failure and hepatic abscess post partum in 2012. She has since been cleared by cardiology and no longer follows up.   She presented with RLQ abdominal pain  Work up shows, WBC 15.6k, normal renal function, normal UA, CT of abdomen and pelvis consistent with acute appendicitis. Also noted an enlarged right ovarian dermoid which she has known about.  Patient was admitted and underwent procedure listed above.  Tolerated procedure well and was transferred to the floor.  Diet was advanced as tolerated.  On POD#1, the patient was voiding well, tolerating diet, ambulating well, pain well controlled, vital signs stable, incisions c/d/i and felt stable for discharge home.  Medication risks, benefits and therapeutic alternatives were reviewed with the patient.  She verbalizes understanding.  Patient will follow up in our office in 3 weeks and knows to call with questions or concerns.  Physical Exam: General:  Alert, NAD, pleasant, comfortable Abd:  Soft, ND, mild tenderness, incisions C/D/I    Medication List    STOP taking these medications        dextrose 5 % SOLN 50 mL with cefTRIAXone 500 MG SOLR 500 mg     INVANZ 1 g Solr injection  Generic drug:  ertapenem      TAKE these medications        carvedilol 6.25 MG tablet  Commonly known as:  COREG  Take 6.25 mg by mouth 2  (two) times daily with a meal.     digoxin 0.125 MG tablet  Commonly known as:  LANOXIN  Take 125 mcg by mouth daily.     LANTUS SOLOSTAR 100 UNIT/ML Solostar Pen  Generic drug:  Insulin Glargine  Inject 44 Units into the skin daily at 10 pm.     lisinopril 5 MG tablet  Commonly known as:  PRINIVIL,ZESTRIL  Take 5 mg by mouth daily.     metFORMIN 500 MG 24 hr tablet  Commonly known as:  GLUCOPHAGE-XR  Take 2 tablets (1,000 mg total) by mouth 2 (two) times daily.  Start taking on:  01/30/2016     oxyCODONE-acetaminophen 5-325 MG tablet  Commonly known as:  PERCOCET/ROXICET  Take 1-2 tablets by mouth every 4 (four) hours as needed for moderate pain.     pravastatin 40 MG tablet  Commonly known as:  PRAVACHOL  Take 40 mg by mouth daily.         Follow-up Information    Follow up with [facility] On 02/19/2016.   Specialty:  General Surgery   Why:  arrive by 10:50AM for a 11:20AM post op check   Contact information:   [place] [address] [phone]       Signed: Erby Pian, [facility] [phone]  01/29/2016, 7:31 AM

## 2016-01-29 NOTE — Discharge Instructions (Signed)
Resume metformin 2/9 with dinner Be sure to follow up with OB-GYN to address the right ovarian dermoid.    LAPAROSCOPIC SURGERY: POST OP INSTRUCTIONS  1. DIET: Follow a light bland diet the first 24 hours after arrival home, such as soup, liquids, crackers, etc.  Be sure to include lots of fluids daily.  Avoid fast food or heavy meals as your are more likely to get nauseated.  Eat a low fat the next few days after surgery.   2. Take your usually prescribed home medications unless otherwise directed. 3. PAIN CONTROL: a. Pain is best controlled by a usual combination of three different methods TOGETHER: i. Ice/Heat ii. Over the counter pain medication iii. Prescription pain medication b. Most patients will experience some swelling and bruising around the incisions.  Ice packs or heating pads (30-60 minutes up to 6 times a day) will help. Use ice for the first few days to help decrease swelling and bruising, then switch to heat to help relax tight/sore spots and speed recovery.  Some people prefer to use ice alone, heat alone, alternating between ice & heat.  Experiment to what works for you.  Swelling and bruising can take several weeks to resolve.   c. It is helpful to take an over-the-counter pain medication regularly for the first few weeks.  Choose one of the following that works best for you: i. Naproxen (Aleve, etc)  Two 220mg  tabs twice a day ii. Ibuprofen (Advil, etc) Three 200mg  tabs four times a day (every meal & bedtime) iii. Acetaminophen (Tylenol, etc) 500-650mg  four times a day (every meal & bedtime) d. A  prescription for pain medication (such as oxycodone, hydrocodone, etc) should be given to you upon discharge.  Take your pain medication as prescribed.  i. If you are having problems/concerns with the prescription medicine (does not control pain, nausea, vomiting, rash, itching, etc), please call us [phone] to see if we need to switch you to a different pain medicine that will  work better for you and/or control your side effect better. ii. If you need a refill on your pain medication, please contact your pharmacy.  They will contact our office to request authorization. Prescriptions will not be filled after 5 pm or on week-ends. 4. Avoid getting constipated.  Between the surgery and the pain medications, it is common to experience some constipation.  Increasing fluid intake and taking a fiber supplement (such as Metamucil, Citrucel, FiberCon, MiraLax, etc) 1-2 times a day regularly will usually help prevent this problem from occurring.  A mild laxative (prune juice, Milk of Magnesia, MiraLax, etc) should be taken according to package directions if there are no bowel movements after 48 hours.   5. Watch out for diarrhea.  If you have many loose bowel movements, simplify your diet to bland foods & liquids for a few days.  Stop any stool softeners and decrease your fiber supplement.  Switching to mild anti-diarrheal medications (Kayopectate, Pepto Bismol) can help.  If this worsens or does not improve, please call us. 6. Wash / shower every day.  You may shower over the dressings as they are waterproof.  Continue to shower over incision(s) after the dressing is off. 7. Remove your waterproof bandages 5 days after surgery.  You may leave the incision open to air.  You may replace a dressing/Band-Aid to cover the incision for comfort if you wish.  8. ACTIVITIES as tolerated:   a. You may resume regular (light) daily activities beginning the next  day--such as daily self-care, walking, climbing stairs--gradually increasing activities as tolerated.  If you can walk 30 minutes without difficulty, it is safe to try more intense activity such as jogging, treadmill, bicycling, low-impact aerobics, swimming, etc. b. Save the most intensive and strenuous activity for last such as sit-ups, heavy lifting, contact sports, etc  Refrain from any heavy lifting or straining until you are off narcotics  for pain control.   c. DO NOT PUSH THROUGH PAIN.  Let pain be your guide: If it hurts to do something, don't do it.  Pain is your body warning you to avoid that activity for another week until the pain goes down. d. You may drive when you are no longer taking prescription pain medication, you can comfortably wear a seatbelt, and you can safely maneuver your car and apply brakes. e. Dennis Bast may have sexual intercourse when it is comfortable.  9. FOLLOW UP in our office a. Please call [facility] at [phone] to set up an appointment to see your surgeon in the office for a follow-up appointment approximately 2-3 weeks after your surgery. b. Make sure that you call for this appointment the day you arrive home to insure a convenient appointment time. 10. IF YOU HAVE DISABILITY OR FAMILY LEAVE FORMS, BRING THEM TO THE OFFICE FOR PROCESSING.  DO NOT GIVE THEM TO YOUR DOCTOR.   WHEN TO CALL us [phone]: 1. Poor pain control 2. Reactions / problems with new medications (rash/itching, nausea, etc)  3. Fever over 101.5 F (38.5 C) 4. Inability to urinate 5. Nausea and/or vomiting 6. Worsening swelling or bruising 7. Continued bleeding from incision. 8. Increased pain, redness, or drainage from the incision   The clinic staff is available to answer your questions during regular business hours (8:30am-5pm).  Please dont hesitate to call and ask to speak to one of our nurses for clinical concerns.   If you have a medical emergency, go to the nearest emergency room or call 911.  A surgeon from [facility] is always on call at the [facility], [address], [place], [place], [place]  [address] ? MAIN: [phone] ? TOLL FREE: [phone] ?  FAX [phone] [email or web address]

## 2016-01-29 NOTE — Progress Notes (Signed)
Patient with discharge order but awaiting for family at this time.

## 2016-01-29 NOTE — Progress Notes (Signed)
   01/29/16 1049  Pressure Ulcer Prevention  Repositioned Standing  Positioning Frequency Able to turn self  Hygiene  Hygiene Peri care  Level of Assistance Independent  Mobility  Activity Ambulate in hall;Ambulate in room;Bathroom privileges  Level of Assistance Minimal assist, patient does 75% or more  Assistive Device None  Distance Ambulated (ft) 100 ft  Ambulation Response Tolerated well  Bed Position Chair  Range of Motion Active;All extremities    Patient denied any HA or Dizziness at this time. Able to ambulate hallway with no assistance. Will continue to monitor.   Ave Filter, RN

## 2016-01-29 NOTE — Care Management Note (Addendum)
Case Management Note  Patient Details  Name: Mabelle Mungin MRN: [id number] Date of Birth: 12/09/1976  Subjective/Objective:                    Action/Plan: Plan is for patient to discharge home with self care. No PCP listed but CM met with the patient and she is seen at [facility] and community wellness.  No further needs per CM.   Expected Discharge Date:                  Expected Discharge Plan:  Home/Self Care  In-House Referral:     Discharge planning Services     Post Acute Care Choice:    Choice offered to:     DME Arranged:    DME Agency:     HH Arranged:    [place] Agency:     Status of Service:  Completed, signed off  Medicare Important Message Given:    Date Medicare IM Given:    Medicare IM give by:    Date Additional Medicare IM Given:    Additional Medicare Important Message give by:     If discussed at [place] of Stay Meetings, dates discussed:    Additional Comments:  Pollie Friar, RN 01/29/2016, 10:51 AM

## 2016-02-10 ENCOUNTER — Ambulatory Visit: Payer: Medicaid Other | Attending: Gynecologic Oncology | Admitting: Gynecologic Oncology

## 2016-02-10 ENCOUNTER — Other Ambulatory Visit (HOSPITAL_BASED_OUTPATIENT_CLINIC_OR_DEPARTMENT_OTHER): Payer: Medicaid Other

## 2016-02-10 ENCOUNTER — Encounter: Payer: Self-pay | Admitting: Gynecologic Oncology

## 2016-02-10 VITALS — BP 147/95 | HR 90 | Temp 98.1°F | Resp 18 | Ht <= 58 in | Wt 238.6 lb

## 2016-02-10 DIAGNOSIS — F17210 Nicotine dependence, cigarettes, uncomplicated: Secondary | ICD-10-CM | POA: Diagnosis not present

## 2016-02-10 DIAGNOSIS — Z833 Family history of diabetes mellitus: Secondary | ICD-10-CM | POA: Diagnosis not present

## 2016-02-10 DIAGNOSIS — Z7984 Long term (current) use of oral hypoglycemic drugs: Secondary | ICD-10-CM | POA: Insufficient documentation

## 2016-02-10 DIAGNOSIS — D649 Anemia, unspecified: Secondary | ICD-10-CM | POA: Insufficient documentation

## 2016-02-10 DIAGNOSIS — Z794 Long term (current) use of insulin: Secondary | ICD-10-CM | POA: Insufficient documentation

## 2016-02-10 DIAGNOSIS — F329 Major depressive disorder, single episode, unspecified: Secondary | ICD-10-CM | POA: Insufficient documentation

## 2016-02-10 DIAGNOSIS — R1900 Intra-abdominal and pelvic swelling, mass and lump, unspecified site: Secondary | ICD-10-CM | POA: Insufficient documentation

## 2016-02-10 DIAGNOSIS — E1165 Type 2 diabetes mellitus with hyperglycemia: Secondary | ICD-10-CM | POA: Insufficient documentation

## 2016-02-10 DIAGNOSIS — D270 Benign neoplasm of right ovary: Secondary | ICD-10-CM | POA: Diagnosis not present

## 2016-02-10 NOTE — Progress Notes (Signed)
Consult Note: Gyn-Onc  Consult was requested by Dr. Barry Dienes for the evaluation of [name] 40 y.o. female  CC:  Chief Complaint  Patient presents with  . Pelvic mass    New Consultation    Assessment/Plan:  [name]  is a 40 y.o.  year old with a 10.3cm right dermoid cyst. The patient has extremely poorly controlled diabetes (random blood glucose approx 230). She is not taking her diabetes medication and rarely checks sugars at home.  I discussed  That I do not believe that her cyst is malignant. I reviewed her CT scans from 2012 when the cyst was present and only minimally smaller at that time. I discussed the this was a malignancy it would be substantially more complex in developed in that intervening 5 year period. However given its size I recommend surgical removal to prevent torsion. At this size she may still be amenable to a minimally invasive approach with robotic assisted  Right salpingo-oophorectomy. We will use an Endo Catch bag and decompressed the cyst and contained fashion and this may facilitate a minimally invasive approach.   I discussed that poorly controlled diabetes places her at substantially increased risk for perioperative complications particular those associated with infection and wound healing. Therefore, given the asymptomatic nature of this mass, and the relative  Elective nature of the surgery, I believe she first needs to control her underlying diabetes better prior to surgery. We will draw an HbA1c today. I recommended that she schedule an appointment with her endocrinologist, Dr. Rutherford Nail from [facility] , and we will schedule her surgery for either late March or early April.   I discussed surgical risks with the patient including  bleeding, infection, damage to internal organs (such as bladder,ureters, bowels), blood clot, reoperation and rehospitalization. I discussed anticipated operative time.   HPI: [name] is a 40 year old  [id number] who is seen in consultation at the request of Dr Barry Dienes for a 10cm right ovarian dermoid cyst.   in 2012 patient was undergoing a CT scan of the abdomen and pelvis for an unrelated condition. I reviewed the images from that particular CT scan and this demonstrated an approximately 8 cm dermoid cyst on the right. The cyst was asymptomatic at that time and the patient was told does not pessary to undergo surgery at that point.   on 01/28/2016 the [facility] with symptoms consistent with acute appendicitis. She was again [place] the CT scan of the abdomen and pelvis which revealed this time the dermoid cyst measuring 10.3 cm on the right. Other significant findings included and appendix that was enlarged and inflamed and consistent with appendicitis. She was taken to the operating room with Dr. Barry Dienes who performed a laparoscopic appendectomy. The procedure was unremarkable.    given the findings of the cyst on imaging and at time of surgery she was referred for further evaluation and management.   The patient reports no abdominal pain, or no mass effect. She has a past medical history that is significant for diabetes mellitus type 2 which is poorly controlled. Dr. Karna Dupes is her endocrinologist at [facility]. The patient states that she's not been taking her insulin post append to ectomy due to concern about whether placed the injection sites on her abdomen. Prior to that she had been somewhat irregular she admits with taking her medication. She is also irregular with assessment of her blood glucose.   She has a past surgical history significant  only for tonsillectomy and appendectomy. She's had for 5 vaginal deliveries. She is morbidly obese.   Current Meds:  Outpatient Encounter Prescriptions as of 02/10/2016  Medication Sig  . Insulin Glargine (LANTUS SOLOSTAR) 100 UNIT/ML Solostar Pen Inject 44 Units into the skin daily at 10 pm.  . lisinopril  (PRINIVIL,ZESTRIL) 5 MG tablet Take 5 mg by mouth daily.  . metFORMIN (GLUCOPHAGE-XR) 500 MG 24 hr tablet Take 2 tablets (1,000 mg total) by mouth 2 (two) times daily.  . pravastatin (PRAVACHOL) 40 MG tablet Take 40 mg by mouth daily.  Marland Kitchen oxyCODONE-acetaminophen (PERCOCET/ROXICET) 5-325 MG tablet Take 1-2 tablets by mouth every 4 (four) hours as needed for moderate pain. (Patient not taking: Reported on 02/10/2016)  . [DISCONTINUED] carvedilol (COREG) 6.25 MG tablet Take 6.25 mg by mouth 2 (two) times daily with a meal.    . [DISCONTINUED] digoxin (LANOXIN) 0.125 MG tablet Take 125 mcg by mouth daily.     No facility-administered encounter medications on file as of 02/10/2016.    Allergy: No Known Allergies  Social Hx:   Social History   Social History  . Marital Status: Single    Spouse Name: N/A  . Number of Children: N/A  . Years of Education: N/A   Occupational History  . Not on file.   Social History Main Topics  . Smoking status: Current Every Day Smoker -- 0.50 packs/day for 21 years    Types: Cigarettes  . Smokeless tobacco: Never Used  . Alcohol Use: No  . Drug Use: No  . Sexual Activity: Not Currently    Birth Control/ Protection: None   Other Topics Concern  . Not on file   Social History Narrative    Past Surgical Hx:  Past Surgical History  Procedure Laterality Date  . Tonsillectomy    . Cryotherapy    . Dilation and curettage of uterus  1994  . Laparoscopic appendectomy N/A 01/28/2016    Procedure: APPENDECTOMY LAPAROSCOPIC;  Surgeon: Stark Klein, MD;  Location: [facility] OR;  Service: General;  Laterality: N/A;    Past Medical Hx:  Past Medical History  Diagnosis Date  . Diabetes mellitus   . Anemia   . Depression   . Migraine   . Abnormal Pap smear   . Tobacco abuse     Past Gynecological History:  P4, svd's  Patient's last menstrual period was 01/22/2016.  Family Hx:  Family History  Problem Relation Age of Onset  . Diabetes Maternal Grandmother    . Diabetes Paternal Grandfather     Review of Systems:  Constitutional  Feels well,    ENT Normal appearing ears and nares bilaterally Skin/Breast  No rash, sores, jaundice, itching, dryness Cardiovascular  No chest pain, shortness of breath, or edema  Pulmonary  No cough or wheeze.  Gastro Intestinal  No nausea, vomitting, or diarrhoea. No bright red blood per rectum, no abdominal pain, change in bowel movement, or constipation.  Genito Urinary  No frequency, urgency, dysuria,  Musculo Skeletal  No myalgia, arthralgia, joint swelling or pain  Neurologic  No weakness, numbness, change in gait,  Psychology  No depression, anxiety, insomnia.   Vitals:  Blood pressure 147/95, pulse 90, temperature 98.1 F (36.7 C), temperature source Oral, resp. rate 18, height 3\' 1"  (0.94 m), weight 238 lb 9.6 oz (108.228 kg), last menstrual period 01/22/2016, SpO2 99 %, currently breastfeeding.  Physical Exam: WD in NAD Neck  Supple NROM, without any enlargements.  Lymph Node Survey No cervical supraclavicular  or inguinal adenopathy Cardiovascular  Pulse normal rate, regularity and rhythm. S1 and S2 normal.  Lungs  Clear to auscultation bilateraly, without wheezes/crackles/rhonchi. Good air movement.  Skin  No rash/lesions/breakdown  Psychiatry  Alert and oriented to person, place, and time  Abdomen  Normoactive bowel sounds, abdomen soft, non-tender and obese without evidence of hernia. Well healed incisions Back No CVA tenderness Genito Urinary  Vulva/vagina: Normal external female genitalia.   No lesions. No discharge or bleeding.  Bladder/urethra:  No lesions or masses, well supported bladder  Vagina: normal  Cervix: Normal appearing, no lesions.  Uterus:  Small, mobile, no parametrial involvement or nodularity.  Adnexa: Large, 10cm, mobile, smooth right adnexal mass. Rectal  deferred Extremities  No bilateral cyanosis, clubbing or edema.   Donaciano Eva, MD   02/10/2016, 12:30 PM  CC: Dr Mamie Laurel, Dr Lester Kinsman

## 2016-02-10 NOTE — Patient Instructions (Signed)
Preparing for your Surgery  Plan for surgery on March 24, 2016 with Dr. Everitt Amber.  You will be scheduled for a robotic assisted right salpingo-oophorectomy.  We will be checking an A1C today and Dr. Denman George would like for you to see your endocrinologist to work on better glucose control prior to surgery.    Pre-operative Testing -You will receive a phone call from presurgical testing at [facility] to arrange for a pre-operative testing appointment before your surgery.  This appointment normally occurs one to two weeks before your scheduled surgery.   -Bring your insurance card, copy of an advanced directive if applicable, medication list  -At that visit, you will be asked to sign a consent for a possible blood transfusion in case a transfusion becomes necessary during surgery.  The need for a blood transfusion is rare but having consent is a necessary part of your care.     -You should not be taking blood thinners or aspirin at least ten days prior to surgery unless instructed by your surgeon.  Day Before Surgery at [facility] will be asked to take in only clear liquids the day before surgery.  Examples of clear liquids include broths, jello, and clear juices.  Avoid carbonated beverages.  You will be advised to have nothing to eat or drink after midnight the evening before.    Your role in recovery Your role is to become active as soon as directed by your doctor, while still giving yourself time to heal.  Rest when you feel tired. You will be asked to do the following in order to speed your recovery:  - Cough and breathe deeply. This helps toclear and expand your lungs and can prevent pneumonia. You may be given a spirometer to practice deep breathing. A staff member will show you how to use the spirometer. - Do mild physical activity. Walking or moving your legs help your circulation and body functions return to normal. A staff member will help you when you try to walk and  will provide you with simple exercises. Do not try to get up or walk alone the first time. - Actively manage your pain. Managing your pain lets you move in comfort. We will ask you to rate your pain on a scale of zero to 10. It is your responsibility to tell your doctor or nurse where and how much you hurt so your pain can be treated.  Special Considerations -If you are diabetic, you may be placed on insulin after surgery to have closer control over your blood sugars to promote healing and recovery.  This does not mean that you will be discharged on insulin.  If applicable, your oral antidiabetics will be resumed when you are tolerating a solid diet.  -Your final pathology results from surgery should be available by the Friday after surgery and the results will be relayed to you when available.  Blood Transfusion Information WHAT IS A BLOOD TRANSFUSION? A transfusion is the replacement of blood or some of its parts. Blood is made up of multiple cells which provide different functions.  Red blood cells carry oxygen and are used for blood loss replacement.  White blood cells fight against infection.  Platelets control bleeding.  Plasma helps clot blood.  Other blood products are available for specialized needs, such as hemophilia or other clotting disorders. BEFORE THE TRANSFUSION  Who gives blood for transfusions?   You may be able to donate blood to be used at a later date  on yourself (autologous donation).  Relatives can be asked to donate blood. This is generally not any safer than if you have received blood from a stranger. The same precautions are taken to ensure safety when a relative's blood is donated.  Healthy volunteers who are fully evaluated to make sure their blood is safe. This is blood bank blood. Transfusion therapy is the safest it has ever been in the practice of medicine. Before blood is taken from a donor, a complete history is taken to make sure that person has no  history of diseases nor engages in risky social behavior (examples are intravenous drug use or sexual activity with multiple partners). The donor's travel history is screened to minimize risk of transmitting infections, such as malaria. The donated blood is tested for signs of infectious diseases, such as HIV and hepatitis. The blood is then tested to be sure it is compatible with you in order to minimize the chance of a transfusion reaction. If you or a relative donates blood, this is often done in anticipation of surgery and is not appropriate for emergency situations. It takes many days to process the donated blood. RISKS AND COMPLICATIONS Although transfusion therapy is very safe and saves many lives, the main dangers of transfusion include:   Getting an infectious disease.  Developing a transfusion reaction. This is an allergic reaction to something in the blood you were given. Every precaution is taken to prevent this. The decision to have a blood transfusion has been considered carefully by your caregiver before blood is given. Blood is not given unless the benefits outweigh the risks.

## 2016-02-11 LAB — HEMOGLOBIN A1C
ESTIMATED AVERAGE GLUCOSE: 263 mg/dL
HEMOGLOBIN A1C: 10.8 % — AB (ref 4.8–5.6)

## 2016-02-25 DIAGNOSIS — E282 Polycystic ovarian syndrome: Secondary | ICD-10-CM

## 2016-02-25 DIAGNOSIS — E782 Mixed hyperlipidemia: Secondary | ICD-10-CM

## 2016-02-25 DIAGNOSIS — E1129 Type 2 diabetes mellitus with other diabetic kidney complication: Secondary | ICD-10-CM | POA: Insufficient documentation

## 2016-02-25 DIAGNOSIS — E1165 Type 2 diabetes mellitus with hyperglycemia: Secondary | ICD-10-CM

## 2016-02-25 DIAGNOSIS — R809 Proteinuria, unspecified: Secondary | ICD-10-CM

## 2016-02-25 HISTORY — DX: Polycystic ovarian syndrome: E28.2

## 2016-02-25 HISTORY — DX: Proteinuria, unspecified: E11.29

## 2016-02-25 HISTORY — DX: Type 2 diabetes mellitus with hyperglycemia: E11.65

## 2016-02-25 HISTORY — DX: Mixed hyperlipidemia: E78.2

## 2016-02-25 HISTORY — DX: Type 2 diabetes mellitus with other diabetic kidney complication: R80.9

## 2016-03-11 ENCOUNTER — Encounter (HOSPITAL_COMMUNITY): Payer: Self-pay

## 2016-03-11 ENCOUNTER — Encounter (HOSPITAL_COMMUNITY)
Admission: RE | Admit: 2016-03-11 | Discharge: 2016-03-11 | Disposition: A | Discharge status: Home / Self Care | Payer: Medicaid Other | Source: Ambulatory Visit | Attending: Gynecologic Oncology | Admitting: Gynecologic Oncology

## 2016-03-11 DIAGNOSIS — Z0183 Encounter for blood typing: Secondary | ICD-10-CM | POA: Insufficient documentation

## 2016-03-11 DIAGNOSIS — R1900 Intra-abdominal and pelvic swelling, mass and lump, unspecified site: Secondary | ICD-10-CM | POA: Insufficient documentation

## 2016-03-11 DIAGNOSIS — Z01812 Encounter for preprocedural laboratory examination: Secondary | ICD-10-CM | POA: Insufficient documentation

## 2016-03-11 HISTORY — DX: Cardiomyopathy, unspecified: I42.9

## 2016-03-11 HISTORY — DX: Abscess of liver: K75.0

## 2016-03-11 HISTORY — DX: Essential (primary) hypertension: I10

## 2016-03-11 LAB — CBC WITH DIFFERENTIAL/PLATELET
BASOS PCT: 0 %
Basophils Absolute: 0 10*3/uL (ref 0.0–0.1)
EOS ABS: 0.2 10*3/uL (ref 0.0–0.7)
Eosinophils Relative: 2 %
HCT: 47.3 % — ABNORMAL HIGH (ref 36.0–46.0)
HEMOGLOBIN: 14.8 g/dL (ref 12.0–15.0)
LYMPHS ABS: 3 10*3/uL (ref 0.7–4.0)
Lymphocytes Relative: 28 %
MCH: 26.5 pg (ref 26.0–34.0)
MCHC: 31.3 g/dL (ref 30.0–36.0)
MCV: 84.6 fL (ref 78.0–100.0)
Monocytes Absolute: 0.7 10*3/uL (ref 0.1–1.0)
Monocytes Relative: 6 %
Neutro Abs: 6.8 10*3/uL (ref 1.7–7.7)
Neutrophils Relative %: 64 %
Platelets: 207 10*3/uL (ref 150–400)
RBC: 5.59 MIL/uL — AB (ref 3.87–5.11)
RDW: 15 % (ref 11.5–15.5)
WBC: 10.7 10*3/uL — AB (ref 4.0–10.5)

## 2016-03-11 LAB — URINE MICROSCOPIC-ADD ON

## 2016-03-11 LAB — COMPREHENSIVE METABOLIC PANEL
ALBUMIN: 4.4 g/dL (ref 3.5–5.0)
ALK PHOS: 43 U/L (ref 38–126)
ALT: 27 U/L (ref 14–54)
AST: 23 U/L (ref 15–41)
Anion gap: 12 (ref 5–15)
BUN: 17 mg/dL (ref 6–20)
CALCIUM: 9.7 mg/dL (ref 8.9–10.3)
CO2: 23 mmol/L (ref 22–32)
CREATININE: 0.68 mg/dL (ref 0.44–1.00)
Chloride: 107 mmol/L (ref 101–111)
GFR calc Af Amer: >60 mL/min (ref >60)
GFR calc non Af Amer: >60 mL/min (ref >60)
GLUCOSE: 187 mg/dL — AB (ref 65–99)
Potassium: 4.4 mmol/L (ref 3.5–5.1)
SODIUM: 142 mmol/L (ref 135–145)
Total Bilirubin: 0.4 mg/dL (ref 0.3–1.2)
Total Protein: 7.4 g/dL (ref 6.5–8.1)

## 2016-03-11 LAB — URINALYSIS, ROUTINE W REFLEX MICROSCOPIC
BILIRUBIN URINE: NEGATIVE
Glucose, UA: >1000 mg/dL — AB
Hgb urine dipstick: NEGATIVE
Ketones, ur: NEGATIVE mg/dL
Leukocytes, UA: NEGATIVE
NITRITE: NEGATIVE
Protein, ur: NEGATIVE mg/dL
SPECIFIC GRAVITY, URINE: 1.038 — AB (ref 1.005–1.030)
pH: 6.5 (ref 5.0–8.0)

## 2016-03-11 LAB — PREGNANCY, URINE: Preg Test, Ur: NEGATIVE

## 2016-03-11 LAB — ABO/RH: ABO/RH(D): A POS

## 2016-03-11 NOTE — Patient Instructions (Addendum)
[name]  03/11/2016   Your procedure is scheduled on: 03-24-16  Report to [facility]  Entrance take [facility]  elevators to 3rd floor to  [place] at  [place] AM.  Call this number if you have problems the morning of surgery [phone]   Remember: ONLY 1 PERSON MAY GO WITH YOU TO SHORT STAY TO GET  READY MORNING OF [place].  Do not eat food or drink liquids :After Midnight. Clear Liquids 24 hours prior to surgery .   CLEAR LIQUID DIET   Foods Allowed                                                                     Foods Excluded  Coffee and tea, regular and decaf                             liquids that you cannot  Plain Jell-O in any flavor                                             see through such as: Fruit ices (not with fruit pulp)                                     milk, soups, orange juice  Iced Popsicles                                    All solid food Carbonated beverages, regular and diet                                    Cranberry, grape and apple juices Sports drinks like Gatorade Lightly seasoned clear broth or consume(fat free) Sugar, honey syrup   _____________________________________________________________________       Take these medicines the morning of surgery with A SIP OF WATER: Pravachol. Lantus(1/2 usual PM dose) night before. Take no insulin or diabetic meds AM of. DO NOT TAKE ANY DIABETIC MEDICATIONS DAY OF YOUR SURGERY                               You may not have any metal on your body including hair pins and              piercings  Do not wear jewelry, make-up, lotions, powders or perfumes, deodorant             Do not wear nail polish.  Do not shave  48 hours prior to surgery.              Men may shave face and neck.   Do not bring valuables to the hospital. [facility] IS NOT  RESPONSIBLE   FOR VALUABLES.  Contacts, dentures or bridgework may not be worn into surgery.  Leave  suitcase in the car. After surgery it may be brought to your room.     Patients discharged the day of surgery will not be allowed to drive home.  Name and phone number of your driver: Baldwin Crown, daughter [phone] cell  Special Instructions: N/A              Please read over the following fact sheets you were given: _____________________________________________________________________             [facility] - Preparing for Surgery Before surgery, you can play an important role.  Because skin is not sterile, your skin needs to be as free of germs as possible.  You can reduce the number of germs on your skin by washing with CHG (chlorahexidine gluconate) soap before surgery.  CHG is an antiseptic cleaner which kills germs and bonds with the skin to continue killing germs even after washing. Please DO NOT use if you have an allergy to CHG or antibacterial soaps.  If your skin becomes reddened/irritated stop using the CHG and inform your nurse when you arrive at Short Stay. Do not shave (including legs and underarms) for at least 48 hours prior to the first CHG shower.  You may shave your face/neck. Please follow these instructions carefully:  1.  Shower with CHG Soap the night before surgery and the  morning of Surgery.  2.  If you choose to wash your hair, wash your hair first as usual with your  normal  shampoo.  3.  After you shampoo, rinse your hair and body thoroughly to remove the  shampoo.                           4.  Use CHG as you would any other liquid soap.  You can apply chg directly  to the skin and wash                       Gently with a scrungie or clean washcloth.  5.  Apply the CHG Soap to your body ONLY FROM THE NECK DOWN.   Do not use on face/ open                           Wound or open sores. Avoid contact with eyes, ears mouth and genitals (private parts).                       Wash face,  Genitals (private parts) with your normal soap.             6.  Wash  thoroughly, paying special attention to the area where your surgery  will be performed.  7.  Thoroughly rinse your body with warm water from the neck down.  8.  DO NOT shower/wash with your normal soap after using and rinsing off  the CHG Soap.                9.  Pat yourself dry with a clean towel.            10.  Wear clean pajamas.            11.  Place clean sheets on your bed the night of your first shower and do not  sleep with  pets. Day of Surgery : Do not apply any lotions/deodorants the morning of surgery.  Please wear clean clothes to the hospital/surgery center.  FAILURE TO FOLLOW THESE INSTRUCTIONS MAY RESULT IN THE CANCELLATION OF YOUR SURGERY PATIENT SIGNATURE_________________________________  NURSE SIGNATURE__________________________________  ________________________________________________________________________   Adam Phenix  An incentive spirometer is a tool that can help keep your lungs clear and active. This tool measures how well you are filling your lungs with each breath. Taking long deep breaths may help reverse or decrease the chance of developing breathing (pulmonary) problems (especially infection) following:  A long period of time when you are unable to move or be active. BEFORE THE PROCEDURE   If the spirometer includes an indicator to show your best effort, your nurse or respiratory therapist will set it to a desired goal.  If possible, sit up straight or lean slightly forward. Try not to slouch.  Hold the incentive spirometer in an upright position. INSTRUCTIONS FOR USE  1. Sit on the edge of your bed if possible, or sit up as far as you can in bed or on a chair. 2. Hold the incentive spirometer in an upright position. 3. Breathe out normally. 4. Place the mouthpiece in your mouth and seal your lips tightly around it. 5. Breathe in slowly and as deeply as possible, raising the piston or the ball toward the top of the column. 6. Hold your  breath for 3-5 seconds or for as long as possible. Allow the piston or ball to fall to the bottom of the column. 7. Remove the mouthpiece from your mouth and breathe out normally. 8. Rest for a few seconds and repeat Steps 1 through 7 at least 10 times every 1-2 hours when you are awake. Take your time and take a few normal breaths between deep breaths. 9. The spirometer may include an indicator to show your best effort. Use the indicator as a goal to work toward during each repetition. 10. After each set of 10 deep breaths, practice coughing to be sure your lungs are clear. If you have an incision (the cut made at the time of surgery), support your incision when coughing by placing a pillow or rolled up towels firmly against it. Once you are able to get out of bed, walk around indoors and cough well. You may stop using the incentive spirometer when instructed by your caregiver.  RISKS AND COMPLICATIONS  Take your time so you do not get dizzy or light-headed.  If you are in pain, you may need to take or ask for pain medication before doing incentive spirometry. It is harder to take a deep breath if you are having pain. AFTER USE  Rest and breathe slowly and easily.  It can be helpful to keep track of a log of your progress. Your caregiver can provide you with a simple table to help with this. If you are using the spirometer at home, follow these instructions: [place] IF:   You are having difficultly using the spirometer.  You have trouble using the spirometer as often as instructed.  Your pain medication is not giving enough relief while using the spirometer.  You develop fever of 100.5 F (38.1 C) or higher. SEEK IMMEDIATE MEDICAL CARE IF:   You cough up bloody sputum that had not been present before.  You develop fever of 102 F (38.9 C) or greater.  You develop worsening pain at or near the incision site. MAKE SURE YOU:   Understand these instructions.  Will watch  your condition.  Will get help right away if you are not doing well or get worse. Document Released: 04/19/2007 Document Revised: 02/29/2012 Document Reviewed: 06/20/2007 [facility] Patient Information 2014 [place], [place].   ________________________________________________________________________

## 2016-03-11 NOTE — Pre-Procedure Instructions (Signed)
EKG/CXR /abd/pelvis  01-28-16 .

## 2016-03-12 LAB — HEMOGLOBIN A1C
HEMOGLOBIN A1C: 9.4 % — AB (ref 4.8–5.6)
Mean Plasma Glucose: 223 mg/dL

## 2016-03-12 LAB — TYPE AND SCREEN
ABO/RH(D): A POS
Antibody Screen: NEGATIVE

## 2016-03-12 NOTE — Pre-Procedure Instructions (Signed)
03-12-16 1730 Hgb A1C level = 9.4, note faxed to Dr. Denman George per Epic.

## 2016-03-13 ENCOUNTER — Telehealth: Payer: Self-pay

## 2016-03-13 NOTE — Telephone Encounter (Signed)
Orders received from [facility] to contact the patient to update with her Hgb A1c results of 9.4 improving . "OK" to proceed with scheduled surgery on April 04,2017 with Dr Everitt Amber . Attempted to contact the patient , no answer , left a detailed message with call back number provided if additional questions arise.

## 2016-03-20 NOTE — Anesthesia Preprocedure Evaluation (Addendum)
Anesthesia Evaluation  Patient identified by MRN, date of birth, ID band Patient awake    Reviewed: Allergy & Precautions, NPO status , Patient's Chart, lab work & pertinent test results  Airway Mallampati: II   Neck ROM: full    Dental  (+) Dental Advisory Given, Poor Dentition,    Pulmonary Current Smoker (1/2 PPD X 21 years),    breath sounds clear to auscultation       Cardiovascular hypertension, Pt. 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   Neuro/Psych  Headaches, Depression    GI/Hepatic   Endo/Other  diabetes, Poorly Controlled, Type 2, Insulin Dependentobese  Renal/GU      Musculoskeletal   Abdominal   Peds  Hematology 14/47   Anesthesia Other Findings   Reproductive/Obstetrics                            Anesthesia Physical Anesthesia Plan  ASA: II  Anesthesia Plan: General   Post-op Pain Management:    Induction: Intravenous  Airway Management Planned: Oral ETT  Additional Equipment:   Intra-op Plan:   Post-operative Plan: Extubation in OR  Informed Consent: I have reviewed the patients History and Physical, chart, labs and discussed the procedure including the risks, benefits and alternatives for the proposed anesthesia with the patient or authorized representative who has indicated his/her understanding and acceptance.     Plan Discussed with:   Anesthesia Plan Comments:         Anesthesia Quick Evaluation

## 2016-03-24 ENCOUNTER — Encounter (HOSPITAL_COMMUNITY)
Admission: RE | Disposition: A | Discharge status: Home / Self Care | Payer: Self-pay | Source: Ambulatory Visit | Attending: Gynecologic Oncology

## 2016-03-24 ENCOUNTER — Ambulatory Visit (HOSPITAL_COMMUNITY)
Admission: RE | Admit: 2016-03-24 | Discharge: 2016-03-24 | Disposition: A | Discharge status: Home / Self Care | Payer: Medicaid Other | Source: Ambulatory Visit | Attending: Gynecologic Oncology | Admitting: Gynecologic Oncology

## 2016-03-24 ENCOUNTER — Ambulatory Visit (HOSPITAL_COMMUNITY): Payer: Medicaid Other | Admitting: Anesthesiology

## 2016-03-24 ENCOUNTER — Encounter (HOSPITAL_COMMUNITY): Payer: Self-pay | Admitting: *Deleted

## 2016-03-24 ENCOUNTER — Telehealth: Payer: Self-pay | Admitting: *Deleted

## 2016-03-24 DIAGNOSIS — Z79899 Other long term (current) drug therapy: Secondary | ICD-10-CM | POA: Insufficient documentation

## 2016-03-24 DIAGNOSIS — F17210 Nicotine dependence, cigarettes, uncomplicated: Secondary | ICD-10-CM | POA: Diagnosis not present

## 2016-03-24 DIAGNOSIS — D279 Benign neoplasm of unspecified ovary: Secondary | ICD-10-CM | POA: Diagnosis present

## 2016-03-24 DIAGNOSIS — E1165 Type 2 diabetes mellitus with hyperglycemia: Secondary | ICD-10-CM | POA: Diagnosis not present

## 2016-03-24 DIAGNOSIS — R1900 Intra-abdominal and pelvic swelling, mass and lump, unspecified site: Secondary | ICD-10-CM | POA: Diagnosis present

## 2016-03-24 DIAGNOSIS — I10 Essential (primary) hypertension: Secondary | ICD-10-CM | POA: Insufficient documentation

## 2016-03-24 DIAGNOSIS — D270 Benign neoplasm of right ovary: Secondary | ICD-10-CM | POA: Diagnosis present

## 2016-03-24 DIAGNOSIS — Z794 Long term (current) use of insulin: Secondary | ICD-10-CM | POA: Insufficient documentation

## 2016-03-24 DIAGNOSIS — Z6837 Body mass index (BMI) 37.0-37.9, adult: Secondary | ICD-10-CM | POA: Insufficient documentation

## 2016-03-24 HISTORY — PX: ROBOTIC ASSISTED SALPINGO OOPHERECTOMY: SHX6082

## 2016-03-24 LAB — GLUCOSE, CAPILLARY
GLUCOSE-CAPILLARY: 103 mg/dL — AB (ref 65–99)
GLUCOSE-CAPILLARY: 160 mg/dL — AB (ref 65–99)

## 2016-03-24 SURGERY — SALPINGO-OOPHORECTOMY, ROBOT-ASSISTED
Anesthesia: General | Laterality: Right

## 2016-03-24 MED ORDER — FENTANYL CITRATE (PF) 100 MCG/2ML IJ SOLN
INTRAMUSCULAR | Status: AC
  Filled 2016-03-24: qty 2

## 2016-03-24 MED ORDER — ACETAMINOPHEN 650 MG RE SUPP
650.0000 mg | RECTAL | Status: DC | PRN
  Filled 2016-03-24: qty 1

## 2016-03-24 MED ORDER — ROCURONIUM BROMIDE 100 MG/10ML IV SOLN
INTRAVENOUS | Status: AC
  Filled 2016-03-24: qty 2

## 2016-03-24 MED ORDER — ONDANSETRON HCL 4 MG/2ML IJ SOLN
INTRAMUSCULAR | Status: DC | PRN
  Administered 2016-03-24: 4 mg via INTRAVENOUS

## 2016-03-24 MED ORDER — ONDANSETRON HCL 4 MG/2ML IJ SOLN: 4.0000 mg | Freq: Four times a day (QID) | INTRAMUSCULAR | Status: DC

## 2016-03-24 MED ORDER — LACTATED RINGERS IV SOLN: INTRAVENOUS | Status: DC

## 2016-03-24 MED ORDER — ACETAMINOPHEN 325 MG PO TABS: 650.0000 mg | ORAL_TABLET | ORAL | Status: DC | PRN

## 2016-03-24 MED ORDER — OXYCODONE-ACETAMINOPHEN 10-325 MG PO TABS: 1.0000 | ORAL_TABLET | Freq: Four times a day (QID) | ORAL | Status: DC | PRN

## 2016-03-24 MED ORDER — SUCCINYLCHOLINE CHLORIDE 20 MG/ML IJ SOLN
INTRAMUSCULAR | Status: DC | PRN
  Administered 2016-03-24: 100 mg via INTRAVENOUS

## 2016-03-24 MED ORDER — LIDOCAINE HCL (CARDIAC) 20 MG/ML IV SOLN
INTRAVENOUS | Status: DC | PRN
  Administered 2016-03-24: 50 mg via INTRAVENOUS

## 2016-03-24 MED ORDER — DEXAMETHASONE SODIUM PHOSPHATE 10 MG/ML IJ SOLN
INTRAMUSCULAR | Status: AC
  Filled 2016-03-24: qty 1

## 2016-03-24 MED ORDER — SODIUM CHLORIDE 0.9% FLUSH: 3.0000 mL | Freq: Two times a day (BID) | INTRAVENOUS | Status: DC

## 2016-03-24 MED ORDER — PROPOFOL 10 MG/ML IV BOLUS
INTRAVENOUS | Status: AC
  Filled 2016-03-24: qty 40

## 2016-03-24 MED ORDER — MIDAZOLAM HCL 2 MG/2ML IJ SOLN
INTRAMUSCULAR | Status: AC
  Filled 2016-03-24: qty 2

## 2016-03-24 MED ORDER — ONDANSETRON HCL 4 MG/2ML IJ SOLN
INTRAMUSCULAR | Status: AC
  Filled 2016-03-24: qty 2

## 2016-03-24 MED ORDER — DEXAMETHASONE SODIUM PHOSPHATE 10 MG/ML IJ SOLN
INTRAMUSCULAR | Status: DC | PRN
  Administered 2016-03-24: 4 mg via INTRAVENOUS

## 2016-03-24 MED ORDER — LIDOCAINE HCL (CARDIAC) 20 MG/ML IV SOLN
INTRAVENOUS | Status: AC
  Filled 2016-03-24: qty 10

## 2016-03-24 MED ORDER — LACTATED RINGERS IV SOLN
INTRAVENOUS | Status: DC | PRN
  Administered 2016-03-24 (×2): via INTRAVENOUS

## 2016-03-24 MED ORDER — OXYCODONE HCL 5 MG PO TABS: 5.0000 mg | ORAL_TABLET | ORAL | Status: DC | PRN

## 2016-03-24 MED ORDER — METOCLOPRAMIDE HCL 5 MG/ML IJ SOLN
INTRAMUSCULAR | Status: AC
  Filled 2016-03-24: qty 2

## 2016-03-24 MED ORDER — METOCLOPRAMIDE HCL 5 MG/ML IJ SOLN
INTRAMUSCULAR | Status: DC | PRN
  Administered 2016-03-24: 10 mg via INTRAVENOUS

## 2016-03-24 MED ORDER — PHENYLEPHRINE 40 MCG/ML (10ML) SYRINGE FOR IV PUSH (FOR BLOOD PRESSURE SUPPORT)
PREFILLED_SYRINGE | INTRAVENOUS | Status: AC
  Filled 2016-03-24: qty 10

## 2016-03-24 MED ORDER — STERILE WATER FOR IRRIGATION IR SOLN
Status: DC | PRN
  Administered 2016-03-24: 1000 mL

## 2016-03-24 MED ORDER — FENTANYL CITRATE (PF) 100 MCG/2ML IJ SOLN
INTRAMUSCULAR | Status: DC | PRN
  Administered 2016-03-24: 100 ug via INTRAVENOUS
  Administered 2016-03-24 (×3): 50 ug via INTRAVENOUS

## 2016-03-24 MED ORDER — PROPOFOL 10 MG/ML IV BOLUS
INTRAVENOUS | Status: DC | PRN
  Administered 2016-03-24: 170 mg via INTRAVENOUS

## 2016-03-24 MED ORDER — SODIUM CHLORIDE 0.9% FLUSH: 3.0000 mL | INTRAVENOUS | Status: DC | PRN

## 2016-03-24 MED ORDER — SUGAMMADEX SODIUM 500 MG/5ML IV SOLN
INTRAVENOUS | Status: DC | PRN
  Administered 2016-03-24: 300 mg via INTRAVENOUS

## 2016-03-24 MED ORDER — FENTANYL CITRATE (PF) 100 MCG/2ML IJ SOLN
25.0000 ug | INTRAMUSCULAR | Status: DC | PRN
  Administered 2016-03-24 (×2): 50 ug via INTRAVENOUS

## 2016-03-24 MED ORDER — MEPERIDINE HCL 50 MG/ML IJ SOLN: 6.2500 mg | INTRAMUSCULAR | Status: DC | PRN

## 2016-03-24 MED ORDER — MORPHINE SULFATE (PF) 10 MG/ML IV SOLN: 2.0000 mg | INTRAVENOUS | Status: DC | PRN

## 2016-03-24 MED ORDER — SODIUM CHLORIDE 0.9 % IV SOLN: 250.0000 mL | INTRAVENOUS | Status: DC | PRN

## 2016-03-24 MED ORDER — MIDAZOLAM HCL 5 MG/5ML IJ SOLN
INTRAMUSCULAR | Status: DC | PRN
  Administered 2016-03-24: 2 mg via INTRAVENOUS

## 2016-03-24 MED ORDER — FENTANYL CITRATE (PF) 250 MCG/5ML IJ SOLN
INTRAMUSCULAR | Status: AC
  Filled 2016-03-24: qty 5

## 2016-03-24 MED ORDER — ROCURONIUM BROMIDE 100 MG/10ML IV SOLN
INTRAVENOUS | Status: DC | PRN
  Administered 2016-03-24: 10 mg via INTRAVENOUS
  Administered 2016-03-24: 35 mg via INTRAVENOUS
  Administered 2016-03-24: 5 mg via INTRAVENOUS
  Administered 2016-03-24: 10 mg via INTRAVENOUS

## 2016-03-24 MED ORDER — PHENYLEPHRINE HCL 10 MG/ML IJ SOLN
INTRAMUSCULAR | Status: DC | PRN
  Administered 2016-03-24 (×4): 80 ug via INTRAVENOUS

## 2016-03-24 MED ORDER — LACTATED RINGERS IR SOLN
Status: DC | PRN
  Administered 2016-03-24: 1000 mL

## 2016-03-24 MED ORDER — ACETAMINOPHEN 500 MG PO TABS
1000.0000 mg | ORAL_TABLET | Freq: Four times a day (QID) | ORAL | Status: DC
  Filled 2016-03-24 (×4): qty 2

## 2016-03-24 SURGICAL SUPPLY — 45 items
CHLORAPREP W/TINT 26ML (MISCELLANEOUS) ×4 IMPLANT
COVER SURGICAL LIGHT HANDLE (MISCELLANEOUS) ×2 IMPLANT
COVER TIP SHEARS 8 DVNC (MISCELLANEOUS) ×1 IMPLANT
COVER TIP SHEARS 8MM DA VINCI (MISCELLANEOUS) ×1
DRAPE ARM DVNC X/XI (DISPOSABLE) ×4 IMPLANT
DRAPE COLUMN DVNC XI (DISPOSABLE) ×1 IMPLANT
DRAPE DA VINCI XI ARM (DISPOSABLE) ×4
DRAPE DA VINCI XI COLUMN (DISPOSABLE) ×1
DRAPE SHEET LG 3/4 BI-LAMINATE (DRAPES) ×4 IMPLANT
DRAPE SURG IRRIG POUCH 19X23 (DRAPES) ×2 IMPLANT
ELECT REM PT RETURN 9FT ADLT (ELECTROSURGICAL) ×2
ELECTRODE REM PT RTRN 9FT ADLT (ELECTROSURGICAL) ×1 IMPLANT
GLOVE BIO SURGEON STRL SZ 6 (GLOVE) ×8 IMPLANT
GLOVE BIO SURGEON STRL SZ 6.5 (GLOVE) ×8 IMPLANT
GOWN STRL REUS W/ TWL LRG LVL3 (GOWN DISPOSABLE) ×3 IMPLANT
GOWN STRL REUS W/TWL LRG LVL3 (GOWN DISPOSABLE) ×3
HOLDER FOLEY CATH W/STRAP (MISCELLANEOUS) IMPLANT
KIT BASIN OR (CUSTOM PROCEDURE TRAY) ×2 IMPLANT
LIQUID BAND (GAUZE/BANDAGES/DRESSINGS) ×2 IMPLANT
MANIPULATOR UTERINE 4.5 ZUMI (MISCELLANEOUS) ×2 IMPLANT
MARKER SKIN DUAL TIP RULER LAB (MISCELLANEOUS) ×2 IMPLANT
OBTURATOR XI 8MM BLADELESS (TROCAR) ×2 IMPLANT
OCCLUDER COLPOPNEUMO (BALLOONS) ×2 IMPLANT
PORT ACCESS TROCAR AIRSEAL 12 (TROCAR) ×2 IMPLANT
PORT ACCESS TROCAR AIRSEAL 5M (TROCAR) ×2
POUCH ENDO CATCH II 15MM (MISCELLANEOUS) ×2 IMPLANT
POUCH SPECIMEN RETRIEVAL 10MM (ENDOMECHANICALS) IMPLANT
SEAL CANN UNIV 5-8 DVNC XI (MISCELLANEOUS) ×4 IMPLANT
SEAL XI 5MM-8MM UNIVERSAL (MISCELLANEOUS) ×4
SET TRI-LUMEN FLTR TB AIRSEAL (TUBING) ×2 IMPLANT
SET TUBE IRRIG SUCTION NO TIP (IRRIGATION / IRRIGATOR) ×2 IMPLANT
SHEET LAVH (DRAPES) ×2 IMPLANT
SOLUTION ELECTROLUBE (MISCELLANEOUS) ×2 IMPLANT
SPONGE LAP 18X18 X RAY DECT (DISPOSABLE) ×2 IMPLANT
SUT MNCRL AB 4-0 PS2 18 (SUTURE) ×4 IMPLANT
SUT VIC AB 0 CT1 27 (SUTURE)
SUT VIC AB 0 CT1 27XBRD ANTBC (SUTURE) IMPLANT
SYR 50ML LL SCALE MARK (SYRINGE) ×2 IMPLANT
TOWEL OR 17X26 10 PK STRL BLUE (TOWEL DISPOSABLE) ×4 IMPLANT
TOWEL OR NON WOVEN STRL DISP B (DISPOSABLE) ×2 IMPLANT
TRAP SPECIMEN MUCOUS 40CC (MISCELLANEOUS) ×2 IMPLANT
TRAY FOLEY W/METER SILVER 14FR (SET/KITS/TRAYS/PACK) ×2 IMPLANT
TRAY LAPAROSCOPIC (CUSTOM PROCEDURE TRAY) ×2 IMPLANT
TROCAR BLADELESS OPT 5 100 (ENDOMECHANICALS) ×2 IMPLANT
WATER STERILE IRR 1500ML POUR (IV SOLUTION) IMPLANT

## 2016-03-24 NOTE — Op Note (Signed)
OPERATIVE NOTE  Date: 03/24/16  Preoperative Diagnosis: right ovarian dermoid cyst   Postoperative Diagnosis:  same  Procedure(s) Performed: Robotic-assisted laparoscopic right salpingo-oophorectomy  Surgeon: Everitt Amber, M.D.  Assistant Surgeon: Lahoma Crocker M.D. (an MD assistant was necessary for tissue manipulation, management of robotic instrumentation, retraction and positioning due to the complexity of the case and hospital policies).   Anesthesia: Gen. endotracheal.  Specimens: Bilateral ovaries, fallopian tubes, pelvic washings  Estimated Blood Loss: 20 mL. Blood Replacement: None  Complications: none  Indication for Procedure:  10cm right dermoid cyst, enlarging in size.  Operative Findings: 10cm right dermoid cyst containing sebaceous material, hair and solid tissue. Frozen section gross evaluation consistent with dermoid.  Procedure: The patient's taken to the operating room and placed under general endotracheal anesthesia testing difficulty. She is placed in a dorsolithotomy position and cervical acromial pad was placed. The arms were tucked with care taken to pad the olecranon process. And prepped and draped in usual sterile fashion. A uterine manipulator (zumi) was placed vaginally. A 61mm incision was made in the left upper quadrant palmer's point and a 5 mm Optiview trocar used to enter the abdomen under direct visualization. With entry into the abdomen and then maintenance of 15 mm of mercury the patient was placed in Trendelenburg position. An incision was made in the umbilicus and a [id number] trochar was placed through this site. Two incisions were made lateral to the umbilical incision in the left and right abdomen measuring 61mm. These incisions were made approximately 10 cm lateral to the umbilical incision. 8 mm robotic trochars were inserted. The robot was docked.  The abdomen was inspected as was the pelvis.  Pelvic washings were obtained. An incision was made on the  right pelvic side wall peritoneum parallel to the IP ligament and the retroperitoneal space entered. The right ureter was identified and the para-rectal space was developed. A window was created in the right broad ligament above the ureter. The right infundibulopelvic vessels were skeletonized cauterized and transected. The utero-ovarian ligaments similarly were cauterized and transected. Specimen was placed in a 96mm Endo Catch bag. The abdomen was copiously irrigated and drained and all operative sites inspected and hemostasis was assured  The robot was undocked. The contents of the Endo Catch bag were first aspirated and then morcellated to facilitate removal from the abdominal cavity through the LUQ incision.  The ports were all remove. The fascial closure at the umbilical incision and left upper quadrant port was made with 0 Vicryl.  All incisions were closed with a running subcuticular Monocryl suture. Dermabond was applied. Sponge, lap and needle counts were correct x 3.     The patient had sequential compression devices for VTE prophylaxis.         Disposition: PACU -stable         Condition: stable  Donaciano Eva, MD

## 2016-03-24 NOTE — Discharge Instructions (Signed)
Unilateral Salpingo-Oophorectomy Unilateral salpingo-oophorectomy is the surgical removal of one fallopian tube and ovary. The ovaries are small organs that produce eggs in women. The fallopian tubes transport the egg from the ovary to the womb (uterus). A unilateral salpingo-oophorectomy may be done for various reasons, including:  Infection in the fallopian tube and ovary.  Scar tissue in the fallopian tube and ovary (adhesions).  A cyst or tumor on the ovary.  A need to remove the fallopian tube and ovary when removing the uterus.  Cancer of the fallopian tube or ovary. The removal of one fallopian tube and ovary will not prevent you from becoming pregnant, put you into menopause, or cause problems with your menstrual periods or sex drive. LET [facility] CARE PROVIDER KNOW ABOUT:  Any allergies you have.  All medicines you are taking, including vitamins, herbs, eye drops, creams, and over-the-counter medicines.  Previous problems you or members of your family have had with the use of anesthetics.  Any blood disorders you have.  Previous surgeries you have had.  Medical conditions you have. RISKS AND COMPLICATIONS  Generally, this is a safe procedure. However, as with any procedure, complications can occur. Possible complications include:  Injury to surrounding organs.  Bleeding.  Infection.  Blood clots in the legs or lungs.  Problems related to anesthesia. BEFORE THE PROCEDURE  Ask your health care provider about changing or stopping your regular medicines. You may need to stop taking certain medicines, such as aspirin or blood thinners, at least 1 week before the surgery.  Do not eat or drink anything for at least 8 hours before the surgery.  If you smoke, do not smoke for at least 2 weeks before the surgery.  Make plans to have someone drive you home after the procedure or after your hospital stay. Also arrange for someone to help you with activities during  recovery. PROCEDURE  You will be given medicine to help you relax before the procedure (sedative). You will then be given medicine to make you sleep through the procedure (general anesthetic). These medicines will be given through an IV access tube that is put into one of your veins.  Once you are asleep, your lower abdomen will be shaved and cleaned. A thin, flexible tube (catheter) will be placed in your bladder.  The surgeon may use a laparoscopic, robotic, or open technique for this surgery:  In the laparoscopic technique, the surgery is done through two small cuts (incisions) in the abdomen. A thin, lighted tube with a tiny camera on the end (laparoscope) is inserted into one of the incisions. The tools needed for the procedure are put through the other incision.  A robotic technique may be chosen to perform complex surgery in a small space. In the robotic technique, small incisions are made. A camera and surgical instruments are passed through the incisions. Surgical instruments are controlled with the help of a robotic arm.  In the open technique, the surgery is done through one large incision in the abdomen.  Using any of these techniques, the surgeon will remove the fallopian tube and ovary. The blood vessels will be clamped and tied.  The surgeon will then use staples or stitches to close the incision or incisions. AFTER THE PROCEDURE  You will be taken to a recovery area where your progress will be monitored for 1-3 hours. Your blood pressure, pulse, and temperature will be checked often. You will remain in the recovery area until you are stable and waking up.  will be taken to a recovery area where your progress will be monitored for 1-3 hours. Your blood pressure, pulse, and temperature will be checked often. You will remain in the recovery area until you are stable and waking up.  · If the laparoscopic technique was used, you may be allowed to go home after several hours. You may have some shoulder pain. This is normal and usually goes away in a day or two.  · If the open technique was used, you will be admitted to the hospital for a couple of days.  · You will be given pain medicine as necessary.  · The  IV tube and catheter will be removed before you are discharged.     This information is not intended to replace advice given to you by your health care provider. Make sure you discuss any questions you have with your health care provider.     Document Released: 10/04/2009 Document Revised: 12/12/2013 Document Reviewed: 05/31/2013  Elsevier Interactive Patient Education ©2016 [employer]

## 2016-03-24 NOTE — H&P (Signed)
Consult Note: Gyn-Onc  Consult was requested by Dr. Barry Dienes for the evaluation of [name] 40 y.o. female  CC:  Chief Complaint  Patient presents with  . Pelvic mass    New Consultation    Assessment/Plan:  Ms. [name] is a 40 y.o. year old with a 10.3cm right dermoid cyst. The patient has extremely poorly controlled diabetes (random blood glucose approx 230). She is not taking her diabetes medication and rarely checks sugars at home.  I discussed That I do not believe that her cyst is malignant. I reviewed her CT scans from 2012 when the cyst was present and only minimally smaller at that time. I discussed the this was a malignancy it would be substantially more complex in developed in that intervening 5 year period. However given its size I recommend surgical removal to prevent torsion. At this size she may still be amenable to a minimally invasive approach with robotic assisted Right salpingo-oophorectomy. We will use an Endo Catch bag and decompressed the cyst and contained fashion and this may facilitate a minimally invasive approach.   I discussed that poorly controlled diabetes places her at substantially increased risk for perioperative complications particular those associated with infection and wound healing.  I discussed surgical risks with the patient including bleeding, infection, damage to internal organs (such as bladder,ureters, bowels), blood clot, reoperation and rehospitalization. I discussed anticipated operative time.   HPI: [name] is a 40 year old [id number] who is seen in consultation at the request of Dr Barry Dienes for a 10cm right ovarian dermoid cyst.  in 2012 patient was undergoing a CT scan of the abdomen and pelvis for an unrelated condition. I reviewed the images from that particular CT scan and this demonstrated an approximately 8 cm dermoid cyst on the right. The cyst was asymptomatic at that time and the patient was told does not pessary to  undergo surgery at that point.  on 01/28/2016 the patient presented [facility] with symptoms consistent with acute appendicitis. She was again Arosemena the CT scan of the abdomen and pelvis which revealed this time the dermoid cyst measuring 10.3 cm on the right. Other significant findings included and appendix that was enlarged and inflamed and consistent with appendicitis. She was taken to the operating room with Dr. Barry Dienes who performed a laparoscopic appendectomy. The procedure was unremarkable.   given the findings of the cyst on imaging and at time of surgery she was referred for further evaluation and management.   The patient reports no abdominal pain, or no mass effect. She has a past medical history that is significant for diabetes mellitus type 2 which is poorly controlled. Dr. Karna Dupes is her endocrinologist at [facility]. The patient states that she's not been taking her insulin post append to ectomy due to concern about whether placed the injection sites on her abdomen. Prior to that she had been somewhat irregular she admits with taking her medication. She is also irregular with assessment of her blood glucose.   She has a past surgical history significant only for tonsillectomy and appendectomy. She's had for 5 vaginal deliveries. She is morbidly obese.   Current Meds:  Outpatient Encounter Prescriptions as of 02/10/2016  Medication Sig  . Insulin Glargine (LANTUS SOLOSTAR) 100 UNIT/ML Solostar Pen Inject 44 Units into the skin daily at 10 pm.  . lisinopril (PRINIVIL,ZESTRIL) 5 MG tablet Take 5 mg by mouth daily.  . metFORMIN (GLUCOPHAGE-XR) 500 MG 24 hr tablet Take 2 tablets (1,000  mg total) by mouth 2 (two) times daily.  . pravastatin (PRAVACHOL) 40 MG tablet Take 40 mg by mouth daily.  Marland Kitchen oxyCODONE-acetaminophen (PERCOCET/ROXICET) 5-325 MG tablet Take 1-2 tablets by mouth every 4 (four) hours as needed for moderate pain. (Patient not  taking: Reported on 02/10/2016)  . [DISCONTINUED] carvedilol (COREG) 6.25 MG tablet Take 6.25 mg by mouth 2 (two) times daily with a meal.   . [DISCONTINUED] digoxin (LANOXIN) 0.125 MG tablet Take 125 mcg by mouth daily.    No facility-administered encounter medications on file as of 02/10/2016.    Allergy: No Known Allergies  Social Hx:  Social History   Social History  . Marital Status: Single    Spouse Name: N/A  . Number of Children: N/A  . Years of Education: N/A   Occupational History  . Not on file.   Social History Main Topics  . Smoking status: Current Every Day Smoker -- 0.50 packs/day for 21 years    Types: Cigarettes  . Smokeless tobacco: Never Used  . Alcohol Use: No  . Drug Use: No  . Sexual Activity: Not Currently    Birth Control/ Protection: None   Other Topics Concern  . Not on file   Social History Narrative    Past Surgical Hx:  Past Surgical History  Procedure Laterality Date  . Tonsillectomy    . Cryotherapy    . Dilation and curettage of uterus  1994  . Laparoscopic appendectomy N/A 01/28/2016    Procedure: APPENDECTOMY LAPAROSCOPIC; Surgeon: Stark Klein, MD; Location: [facility] OR; Service: General; Laterality: N/A;    Past Medical Hx:  Past Medical History  Diagnosis Date  . Diabetes mellitus   . Anemia   . Depression   . Migraine   . Abnormal Pap smear   . Tobacco abuse     Past Gynecological History: P4, svd's Patient's last menstrual period was 01/22/2016.  Family Hx:  Family History  Problem Relation Age of Onset  . Diabetes Maternal Grandmother   . Diabetes Paternal Grandfather     Review of Systems:  Constitutional  Feels well,  ENT Normal appearing ears and nares bilaterally Skin/Breast  No rash, sores, jaundice, itching, dryness Cardiovascular  No chest pain, shortness of breath,  or edema  Pulmonary  No cough or wheeze.  Gastro Intestinal  No nausea, vomitting, or diarrhoea. No bright red blood per rectum, no abdominal pain, change in bowel movement, or constipation.  Genito Urinary  No frequency, urgency, dysuria,  Musculo Skeletal  No myalgia, arthralgia, joint swelling or pain  Neurologic  No weakness, numbness, change in gait,  Psychology  No depression, anxiety, insomnia.   Vitals: Blood pressure 147/95, pulse 90, temperature 98.1 F (36.7 C), temperature source Oral, resp. rate 18, height 3\' 1"  (0.94 m), weight 238 lb 9.6 oz (108.228 kg), last menstrual period 01/22/2016, SpO2 99 %, currently breastfeeding.  Physical Exam: WD in NAD Neck  Supple NROM, without any enlargements.  Lymph Node Survey No cervical supraclavicular or inguinal adenopathy Cardiovascular  Pulse normal rate, regularity and rhythm. S1 and S2 normal.  Lungs  Clear to auscultation bilateraly, without wheezes/crackles/rhonchi. Good air movement.  Skin  No rash/lesions/breakdown  Psychiatry  Alert and oriented to person, place, and time  Abdomen  Normoactive bowel sounds, abdomen soft, non-tender and obese without evidence of hernia. Well healed incisions Back No CVA tenderness Genito Urinary  Vulva/vagina: Normal external female genitalia. No lesions. No discharge or bleeding. Bladder/urethra: No lesions or masses, well supported bladder  Vagina: normal Cervix: Normal appearing, no lesions. Uterus: Small, mobile, no parametrial involvement or nodularity. Adnexa: Large, 10cm, mobile, smooth right adnexal mass. Rectal  deferred Extremities  No bilateral cyanosis, clubbing or edema.   Donaciano Eva, MD

## 2016-03-24 NOTE — Anesthesia Postprocedure Evaluation (Signed)
Anesthesia Post Note  Patient: [name]  Procedure(s) Performed: Procedure(s) (LRB): XI ROBOTIC ASSISTED LAPAROSCOPIC RIGHT SALPINGO OOPHORECTOMY (Right)  Patient location during evaluation: PACU Anesthesia Type: General Level of consciousness: awake and alert Pain management: pain level controlled Vital Signs Assessment: post-procedure vital signs reviewed and stable Respiratory status: spontaneous breathing, nonlabored ventilation, respiratory function stable and patient connected to nasal cannula oxygen Cardiovascular status: blood pressure returned to baseline and stable Postop Assessment: no signs of nausea or vomiting Anesthetic complications: no    Last Vitals:  Filed Vitals:   03/24/16 0945 03/24/16 0949  BP: 159/92   Pulse: 97 99  Temp:    Resp: 24 15    Last Pain:  Filed Vitals:   03/24/16 0951  PainSc: [place]

## 2016-03-24 NOTE — Telephone Encounter (Signed)
Pt called at both contact numbers listed in epic to give post-op follow up appointment. There was not an option available to leave a message.

## 2016-03-24 NOTE — Transfer of Care (Signed)
Immediate Anesthesia Transfer of Care Note  Patient: [name]  Procedure(s) Performed: Procedure(s): XI ROBOTIC ASSISTED LAPAROSCOPIC RIGHT SALPINGO OOPHORECTOMY (Right)  Patient Location: PACU  Anesthesia Type:General  Level of Consciousness:  sedated, patient cooperative and responds to stimulation  Airway & Oxygen Therapy:Patient Spontanous Breathing and Patient connected to face mask oxgen  Post-op Assessment:  Report given to PACU RN and Post -op Vital signs reviewed and stable  Post vital signs:  Reviewed and stable  Last Vitals:  Filed Vitals:   03/24/16 0540  BP: 130/79  Pulse: 104  Temp: 36.6 C  Resp: 18    Complications: No apparent anesthesia complications

## 2016-04-06 ENCOUNTER — Telehealth: Payer: Self-pay | Admitting: Gynecologic Oncology

## 2016-04-06 NOTE — Telephone Encounter (Signed)
Returned call to patient.  Reporting abdominal discomfort around the left upper abdomen incision since surgery.  No other symptoms voiced.  Advised to try a heating pad or ice pack to the area today and call with an update.  Advised if symptoms persist or worsen, we would move her appt up to a sooner date.  No concerns voiced.  Advised to call with an update tomorrow.

## 2016-04-08 ENCOUNTER — Ambulatory Visit: Payer: Medicaid Other | Admitting: Gynecologic Oncology

## 2016-04-13 ENCOUNTER — Encounter: Payer: Self-pay | Admitting: Gynecologic Oncology

## 2016-04-13 ENCOUNTER — Ambulatory Visit: Payer: Medicaid Other | Attending: Gynecologic Oncology | Admitting: Gynecologic Oncology

## 2016-04-13 VITALS — BP 126/77 | HR 91 | Temp 97.8°F | Resp 18 | Ht 67.0 in | Wt 231.5 lb

## 2016-04-13 DIAGNOSIS — N83201 Unspecified ovarian cyst, right side: Secondary | ICD-10-CM | POA: Insufficient documentation

## 2016-04-13 DIAGNOSIS — Z90721 Acquired absence of ovaries, unilateral: Secondary | ICD-10-CM

## 2016-04-13 DIAGNOSIS — Z9889 Other specified postprocedural states: Secondary | ICD-10-CM | POA: Insufficient documentation

## 2016-04-13 DIAGNOSIS — R1900 Intra-abdominal and pelvic swelling, mass and lump, unspecified site: Secondary | ICD-10-CM

## 2016-04-13 DIAGNOSIS — D270 Benign neoplasm of right ovary: Secondary | ICD-10-CM

## 2016-04-13 DIAGNOSIS — Z9079 Acquired absence of other genital organ(s): Secondary | ICD-10-CM | POA: Insufficient documentation

## 2016-04-13 NOTE — Patient Instructions (Signed)
Please call our office for any questions or concerns and follow up with your family doctor for a well women exam annually.

## 2016-04-13 NOTE — Progress Notes (Signed)
POSTOPERATIVE FOLLOW-UP  HPI:  [name] is a 40 y.o. year old [id number] initially seen in consultation on 02/10/16 referred by Dr Barry Dienes for a 10cm right ovarian cyst.  She then underwent a robotic assisted RSO on [id number] without complications.  Her postoperative course was uncomplicated.  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  She is seen today for a postoperative check and to discuss her pathology results and ongoing plan.  Since discharge from the hospital, she is feeling well.  She has improving appetite, normal bowel and bladder function, and pain controlled with minimal PO medication. She has no other complaints today.    Review of systems: Constitutional:  She has no weight gain or weight loss. She has no fever or chills. Eyes: No blurred vision Ears, Nose, Mouth, Throat: No dizziness, headaches or changes in hearing. No mouth sores. Cardiovascular: No chest pain, palpitations or edema. Respiratory:  No shortness of breath, wheezing or cough Gastrointestinal: She has normal bowel movements without diarrhea or constipation. She denies any nausea or vomiting. She denies blood in her stool or heart burn. Genitourinary:  She denies pelvic pain, pelvic pressure or changes in her urinary function. She has no hematuria, dysuria, or incontinence. She has no irregular vaginal bleeding or vaginal discharge Musculoskeletal: Denies muscle weakness or joint pains.  Skin:  She has no skin changes, rashes or itching Neurological:  Denies dizziness or headaches. No neuropathy, no numbness or tingling. Psychiatric:  She denies depression or anxiety. Hematologic/Lymphatic:   No easy bruising or bleeding   Physical Exam: Blood pressure 126/77, pulse 91, temperature 97.8 F (36.6 C), temperature source Oral, resp. rate 18, height 5\' 7"  (1.702 m), weight 231 lb 8 oz (105.008 kg), SpO2 98 %. General: Well dressed, well nourished in no apparent distress.   HEENT:  Normocephalic and  atraumatic, no lesions.  Extraocular muscles intact. Sclerae anicteric. Pupils equal, round, reactive. No mouth sores or ulcers. Thyroid is normal size, not nodular, midline. Abdomen:  Soft, nontender, nondistended.  No palpable masses.  No hepatosplenomegaly.  No ascites. Normal bowel sounds.  No hernias.  Incisions are well healed. There is some tethering of the deeper tissues on the LUQ incision. Genitourinary: deferred Extremities: No cyanosis, clubbing or edema.  No calf tenderness or erythema. No palpable cords. Psychiatric: Mood and affect are appropriate. Neurological: Awake, alert and oriented x 3. Sensation is intact, no neuropathy.  Musculoskeletal: No pain, normal strength and range of motion.  Assessment:    40 y.o. year old with a history of a right ovarian dermoid.   S/p robotic assisted RSO on 03/24/16.   Plan: 1) Pathology reports reviewed today 2) Treatment counseling - I discussed the benign nature of this lesion. I discussed that she still has her uterus and cervix and left tube and ovary and can still conceive a pregnancy. I discussed that she should follow-up with her primary care physician for routine pap cytology as she cannot remember her last pap smear. I discussed that this is important for cervical cancer prevention. She was given the opportunity to ask questions, which were answered to her satisfaction, and she is agreement with the above mentioned plan of care.  3)  Return to clinic on a prn basis  Donaciano Eva, MD

## 2016-05-08 ENCOUNTER — Emergency Department (HOSPITAL_COMMUNITY)
Admission: EM | Admit: 2016-05-08 | Discharge: 2016-05-08 | Disposition: A | Discharge status: Home / Self Care | Payer: Medicaid Other | Attending: Emergency Medicine | Admitting: Emergency Medicine

## 2016-05-08 ENCOUNTER — Encounter (HOSPITAL_COMMUNITY): Payer: Self-pay | Admitting: Emergency Medicine

## 2016-05-08 DIAGNOSIS — I10 Essential (primary) hypertension: Secondary | ICD-10-CM | POA: Insufficient documentation

## 2016-05-08 DIAGNOSIS — E119 Type 2 diabetes mellitus without complications: Secondary | ICD-10-CM | POA: Insufficient documentation

## 2016-05-08 DIAGNOSIS — F17210 Nicotine dependence, cigarettes, uncomplicated: Secondary | ICD-10-CM | POA: Diagnosis not present

## 2016-05-08 DIAGNOSIS — R1011 Right upper quadrant pain: Secondary | ICD-10-CM | POA: Insufficient documentation

## 2016-05-08 LAB — URINE MICROSCOPIC-ADD ON: RBC / HPF: NONE SEEN RBC/hpf (ref 0–5)

## 2016-05-08 LAB — COMPREHENSIVE METABOLIC PANEL
ALBUMIN: 3.6 g/dL (ref 3.5–5.0)
ALT: 20 U/L (ref 14–54)
ANION GAP: 13 (ref 5–15)
AST: 15 U/L (ref 15–41)
Alkaline Phosphatase: 52 U/L (ref 38–126)
BUN: 8 mg/dL (ref 6–20)
CALCIUM: 9.3 mg/dL (ref 8.9–10.3)
CO2: 22 mmol/L (ref 22–32)
Chloride: 104 mmol/L (ref 101–111)
Creatinine, Ser: 0.78 mg/dL (ref 0.44–1.00)
GFR calc non Af Amer: >60 mL/min (ref >60)
GLUCOSE: 183 mg/dL — AB (ref 65–99)
Potassium: 4.3 mmol/L (ref 3.5–5.1)
SODIUM: 139 mmol/L (ref 135–145)
Total Bilirubin: 0.3 mg/dL (ref 0.3–1.2)
Total Protein: 6.9 g/dL (ref 6.5–8.1)

## 2016-05-08 LAB — CBC
HEMATOCRIT: 45.5 % (ref 36.0–46.0)
HEMOGLOBIN: 14.5 g/dL (ref 12.0–15.0)
MCH: 25.6 pg — AB (ref 26.0–34.0)
MCHC: 31.9 g/dL (ref 30.0–36.0)
MCV: 80.4 fL (ref 78.0–100.0)
Platelets: 235 10*3/uL (ref 150–400)
RBC: 5.66 MIL/uL — ABNORMAL HIGH (ref 3.87–5.11)
RDW: 15 % (ref 11.5–15.5)
WBC: 11.5 10*3/uL — ABNORMAL HIGH (ref 4.0–10.5)

## 2016-05-08 LAB — URINALYSIS, ROUTINE W REFLEX MICROSCOPIC
BILIRUBIN URINE: NEGATIVE
HGB URINE DIPSTICK: NEGATIVE
Ketones, ur: NEGATIVE mg/dL
Leukocytes, UA: NEGATIVE
Nitrite: NEGATIVE
PROTEIN: NEGATIVE mg/dL
Specific Gravity, Urine: 1.037 — ABNORMAL HIGH (ref 1.005–1.030)
pH: 5.5 (ref 5.0–8.0)

## 2016-05-08 LAB — LIPASE, BLOOD: LIPASE: 37 U/L (ref 11–51)

## 2016-05-08 NOTE — ED Notes (Signed)
Pt sent here by PCP for eval of RUQ pain with radiation to right shoulder

## 2016-05-08 NOTE — ED Notes (Signed)
No answer in waiting room 

## 2016-05-08 NOTE — ED Notes (Signed)
NO answer in waiting room 

## 2016-05-10 ENCOUNTER — Emergency Department (HOSPITAL_COMMUNITY): Payer: Medicaid Other

## 2016-05-10 ENCOUNTER — Emergency Department (HOSPITAL_COMMUNITY)
Admission: EM | Admit: 2016-05-10 | Discharge: 2016-05-10 | Discharge status: Against Medical Advice | Payer: Medicaid Other | Attending: Emergency Medicine | Admitting: Emergency Medicine

## 2016-05-10 ENCOUNTER — Encounter (HOSPITAL_COMMUNITY): Payer: Self-pay | Admitting: Emergency Medicine

## 2016-05-10 DIAGNOSIS — E119 Type 2 diabetes mellitus without complications: Secondary | ICD-10-CM | POA: Diagnosis not present

## 2016-05-10 DIAGNOSIS — Z862 Personal history of diseases of the blood and blood-forming organs and certain disorders involving the immune mechanism: Secondary | ICD-10-CM | POA: Diagnosis not present

## 2016-05-10 DIAGNOSIS — R079 Chest pain, unspecified: Secondary | ICD-10-CM | POA: Diagnosis not present

## 2016-05-10 DIAGNOSIS — Z794 Long term (current) use of insulin: Secondary | ICD-10-CM | POA: Insufficient documentation

## 2016-05-10 DIAGNOSIS — I10 Essential (primary) hypertension: Secondary | ICD-10-CM | POA: Diagnosis not present

## 2016-05-10 DIAGNOSIS — Z7984 Long term (current) use of oral hypoglycemic drugs: Secondary | ICD-10-CM | POA: Insufficient documentation

## 2016-05-10 DIAGNOSIS — K8020 Calculus of gallbladder without cholecystitis without obstruction: Secondary | ICD-10-CM | POA: Diagnosis not present

## 2016-05-10 DIAGNOSIS — R1011 Right upper quadrant pain: Secondary | ICD-10-CM

## 2016-05-10 DIAGNOSIS — F17210 Nicotine dependence, cigarettes, uncomplicated: Secondary | ICD-10-CM | POA: Diagnosis not present

## 2016-05-10 DIAGNOSIS — Z9049 Acquired absence of other specified parts of digestive tract: Secondary | ICD-10-CM | POA: Insufficient documentation

## 2016-05-10 DIAGNOSIS — Z79899 Other long term (current) drug therapy: Secondary | ICD-10-CM | POA: Insufficient documentation

## 2016-05-10 DIAGNOSIS — Z8659 Personal history of other mental and behavioral disorders: Secondary | ICD-10-CM | POA: Diagnosis not present

## 2016-05-10 LAB — COMPREHENSIVE METABOLIC PANEL
ALBUMIN: 3.7 g/dL (ref 3.5–5.0)
ALK PHOS: 52 U/L (ref 38–126)
ALT: 19 U/L (ref 14–54)
ANION GAP: 8 (ref 5–15)
AST: 17 U/L (ref 15–41)
BILIRUBIN TOTAL: 0.4 mg/dL (ref 0.3–1.2)
BUN: 11 mg/dL (ref 6–20)
CALCIUM: 9.4 mg/dL (ref 8.9–10.3)
CO2: 22 mmol/L (ref 22–32)
Chloride: 108 mmol/L (ref 101–111)
Creatinine, Ser: 0.7 mg/dL (ref 0.44–1.00)
GFR calc Af Amer: >60 mL/min (ref >60)
GFR calc non Af Amer: >60 mL/min (ref >60)
GLUCOSE: 149 mg/dL — AB (ref 65–99)
Potassium: 4 mmol/L (ref 3.5–5.1)
Sodium: 138 mmol/L (ref 135–145)
TOTAL PROTEIN: 7.2 g/dL (ref 6.5–8.1)

## 2016-05-10 LAB — CBC
HCT: 46.9 % — ABNORMAL HIGH (ref 36.0–46.0)
Hemoglobin: 14.7 g/dL (ref 12.0–15.0)
MCH: 25.4 pg — ABNORMAL LOW (ref 26.0–34.0)
MCHC: 31.3 g/dL (ref 30.0–36.0)
MCV: 81.1 fL (ref 78.0–100.0)
Platelets: 244 10*3/uL (ref 150–400)
RBC: 5.78 MIL/uL — ABNORMAL HIGH (ref 3.87–5.11)
RDW: 14.9 % (ref 11.5–15.5)
WBC: 10.2 10*3/uL (ref 4.0–10.5)

## 2016-05-10 LAB — LIPASE, BLOOD: Lipase: 49 U/L (ref 11–51)

## 2016-05-10 LAB — I-STAT TROPONIN, ED: TROPONIN I, POC: 0 ng/mL (ref 0.00–0.08)

## 2016-05-10 MED ORDER — SODIUM CHLORIDE 0.9 % IV BOLUS (SEPSIS)
1000.0000 mL | Freq: Once | INTRAVENOUS | Status: AC
  Administered 2016-05-10: 1000 mL via INTRAVENOUS

## 2016-05-10 MED ORDER — IOPAMIDOL (ISOVUE-370) INJECTION 76%
INTRAVENOUS | Status: AC
  Administered 2016-05-10: 100 mL
  Filled 2016-05-10: qty 100

## 2016-05-10 NOTE — ED Notes (Addendum)
C/o RUQ pain, sob, pressure to center of chest, and nausea x 3 weeks.  Reports R shoulder pain since oophorectomy on 03/24/16.

## 2016-05-10 NOTE — ED Notes (Addendum)
Pt removed her own IV, and stated she was leaving. Pt educated on the risks of leaving against medical advice. Verbalized understanding. Dr. Sabra Heck aware.

## 2016-05-10 NOTE — ED Provider Notes (Signed)
CSN: [id number]     Arrival date & time 05/10/16  1745 History   First MD Initiated Contact with Patient 05/10/16 1819     Chief Complaint  Patient presents with  . Abdominal Pain  . Shoulder Pain  . Chest Pain     (Consider location/radiation/quality/duration/timing/severity/associated sxs/prior Treatment) HPI  The patient is a 40 year old female who has a past medical history significant for a postpartum cardiomyopathy, she is a diabetic and also has high cholesterol and high blood pressure. She reports that in February she had an appendectomy, this was followed by a salpingo-oophorectomy in March because of a large dermoid cyst on her ovary. She also reports a liver abscess in 2012 prior to her cardiomyopathy developing while pregnant. The patient states that since having her operation in March she has had rather persistent daily right upper quadrant pain that goes to the back of her shoulder. This is not worse with eating or position though sometimes it seems to get worse when she lays down. She reports that her chest also feels short of breath and has some chest pain sometimes as well. These are 2 different complaints and they do not reflect symmetrical timing with each other. She has no changes in her bowel habits, no difficulty urinating and denies fevers chills nausea or vomiting. She has been able to eat and drink. She was sent here by her primary care physician because of the right upper quadrant pain thinking that this may be cholecystitis.  Past Medical History  Diagnosis Date  . Diabetes mellitus   . Anemia   . Depression   . Abnormal Pap smear   . Tobacco abuse   . Hypertension   . Cardiomyopathy ([place])     "postpartem cardiomyopathy"- 2012- has since been released by cardiology  . Migraine     past x1 -none since.  . Liver abscess     2012- Terrial Rhodes drained placed for awhile.   Past Surgical History  Procedure Laterality Date  . Tonsillectomy    . Cryotherapy       90's- cervix  . Dilation and curettage of uterus  1994  . Laparoscopic appendectomy N/A 01/28/2016    Procedure: APPENDECTOMY LAPAROSCOPIC;  Surgeon: Stark Klein, MD;  Location: [facility];  Service: General;  Laterality: N/A;  . Appendectomy    . Robotic assisted salpingo oopherectomy Right 03/24/2016    Procedure: XI ROBOTIC ASSISTED LAPAROSCOPIC RIGHT SALPINGO OOPHORECTOMY;  Surgeon: Everitt Amber, MD;  Location: [facility];  Service: Gynecology;  Laterality: Right;   Family History  Problem Relation Age of Onset  . Diabetes Maternal Grandmother   . Diabetes Paternal Grandfather    Social History  Substance Use Topics  . Smoking status: Current Every Day Smoker -- 0.50 packs/day for 21 years    Types: Cigarettes  . Smokeless tobacco: Never Used  . Alcohol Use: No     Comment: rare social   OB History    Gravida Para Term Preterm AB TAB SAB Ectopic Multiple Living   5 4 3 1 1  1   4      Review of Systems  All other systems reviewed and are negative.     Allergies  Review of patient's allergies indicates no known allergies.  Home Medications   Prior to Admission medications   Medication Sig Start Date End Date Taking? Authorizing Provider  Canagliflozin-Metformin HCl (INVOKAMET) [phone] MG TABS Take 1 tablet by mouth 2 (two) times daily.   Yes Historical Provider, MD  Insulin Glargine (LANTUS SOLOSTAR) 100 UNIT/ML Solostar Pen Inject 36 Units into the skin at bedtime.    Yes Historical Provider, MD  lisinopril (PRINIVIL,ZESTRIL) 5 MG tablet Take 5 mg by mouth daily.    Yes Historical Provider, MD  pravastatin (PRAVACHOL) 40 MG tablet Take 40 mg by mouth at bedtime.    Yes Historical Provider, MD   BP 135/91 mmHg  Pulse 97  Temp(Src) 98.9 F (37.2 C) (Oral)  Resp 28  Ht 5\' 7"  (1.702 m)  Wt 230 lb (104.327 kg)  BMI 36.01 kg/m2  SpO2 97%  LMP 05/10/2016 Physical Exam  Constitutional: She appears well-developed and well-nourished. No distress.  HENT:  Head: Normocephalic and  atraumatic.  Mouth/Throat: Oropharynx is clear and moist. No oropharyngeal exudate.  Eyes: Conjunctivae and EOM are normal. Pupils are equal, round, and reactive to light. Right eye exhibits no discharge. Left eye exhibits no discharge. No scleral icterus.  Neck: Normal range of motion. Neck supple. No JVD present. No thyromegaly present.  Cardiovascular: Regular rhythm, normal heart sounds and intact distal pulses.  Exam reveals no gallop and no friction rub.   No murmur heard. Heart rate of 100, strong pulses, no edema, no JVD  Pulmonary/Chest: Effort normal and breath sounds normal. No respiratory distress. She has no wheezes. She has no rales.  Clear lung sounds, no rales, no wheezing, speaks in full sentences  Abdominal: Soft. Bowel sounds are normal. She exhibits no distension and no mass. There is tenderness ( Healed surgical scars over the abdominal wall, right upper quadrant tenderness is moderate, present, no guarding, no peritoneal signs).  Musculoskeletal: Normal range of motion. She exhibits no edema or tenderness.  Lymphadenopathy:    She has no cervical adenopathy.  Neurological: She is alert. Coordination normal.  Skin: Skin is warm and dry. No rash noted. No erythema.  Psychiatric: She has a normal mood and affect. Her behavior is normal.  Nursing note and vitals reviewed.   ED Course  Procedures (including critical care time) Labs Review Labs Reviewed  COMPREHENSIVE METABOLIC PANEL - Abnormal; Notable for the following:    Glucose, Bld 149 (*)    All other components within normal limits  CBC - Abnormal; Notable for the following:    RBC 5.78 (*)    HCT 46.9 (*)    MCH 25.4 (*)    All other components within normal limits  LIPASE, BLOOD  I-STAT TROPOININ, ED    Imaging Review Dg Chest 2 View  05/10/2016  CLINICAL DATA:  40 year old female with right upper quadrant pain, shortness of breath and chest pressure EXAM: CHEST  2 VIEW COMPARISON:  Prior chest x-ray  01/28/2016 FINDINGS: Stable cardiac mediastinal contours. Chronic right middle lobe atelectasis. Chronic elevation of the right hemidiaphragm. Mild bronchitic changes similar compared to prior. No focal airspace consolidation, pleural effusion or pneumothorax. No pulmonary edema. Osseous structures are intact and unremarkable for age. Exaggerated thoracic kyphosis similar compared to prior. IMPRESSION: Stable chest x-ray without evidence of acute cardiopulmonary process. Electronically Signed   By: Jacqulynn Cadet M.D.   On: 05/10/2016 19:43   Ct Angio Chest Pe W/cm &/or Wo Cm  05/10/2016  CLINICAL DATA:  Shortness of breath and mid chest pain for 1 month. Pain onset since surgery for oophorectomy. EXAM: CT ANGIOGRAPHY CHEST WITH CONTRAST TECHNIQUE: Multidetector CT imaging of the chest was performed using the standard protocol during bolus administration of intravenous contrast. Multiplanar CT image reconstructions and MIPs were obtained to evaluate the vascular  anatomy. CONTRAST:  100 cc Isovue 370 IV COMPARISON:  Radiographs earlier this day.  Chest CT 08/31/2011 FINDINGS: There are no filling defects within the pulmonary arteries to the level of the proximal subsegmental branches to suggest pulmonary embolus. The distal subsegmental branch evaluation is limited by contrast bolus timing and soft tissue attenuation from habitus. Lower lobe assessment further limited by mild breathing motion. Thoracic aorta is normal in caliber without evidence of dissection. Heart size is upper normal in size. No mediastinal or hilar adenopathy. No pericardial effusion. Calcifications about the lower pole of the right and left thyroid gland are similar to prior CT, likely calcified nodules. Largest is on the right measuring 8 mm common does not meet size criteria for biopsy. Linear atelectasis or scarring throughout the right lung. No consolidation to suggest pneumonia. No pulmonary edema or septal thickening. There is no  pleural effusion. Mild motion artifact at the lung bases limits detailed assessment. Evaluation of the upper abdomen demonstrates probable hepatic steatosis. Calcified gallstones are noted. Minimal scarring in the right lobe of the liver at site of prior abscess. Multilevel degenerative change throughout the lower thoracic and lumbar spine. There is a bone island within midthoracic vertebral body, unchanged. There are no acute or suspicious osseous abnormalities. Review of the MIP images confirms the above findings. IMPRESSION: 1. No evidence of pulmonary embolus. 2. Scattered linear atelectasis or scarring throughout the right lung. Electronically Signed   By: Jeb Levering M.D.   On: 05/10/2016 21:41   US Abdomen Complete  05/10/2016  CLINICAL DATA:  Chest pain and shortness of breath. EXAM: ABDOMEN ULTRASOUND COMPLETE COMPARISON:  01/28/2016 FINDINGS: Gallbladder: Multiple gallstones identified measuring up to 1.8 cm. No gallbladder wall thickening or para cholecystic fluid. Sonographic Murphy's sign is absent. Common bile duct: Diameter: Poorly seen. A small segment measures up to 5 mm in diameter. Liver: Coarse echogenic liver with poor sonic penetration compatible with diffuse hepatic steatosis. No focal lesions seen. Reduced sensitivity due to body habitus and bowel gas. IVC: No abnormality visualized. Pancreas: Not seen due to overlying bowel gas. Spleen: Size and appearance within normal limits. Right Kidney: Length: 11.9 cm. Echogenicity within normal limits. No mass or hydronephrosis visualized. Left Kidney: Length: 13.3 cm. Echogenicity within normal limits. No mass or hydronephrosis visualized. Abdominal aorta: Not well seen due to overlying bowel gas. Other findings: None. IMPRESSION: 1. Multiple gallstones measuring up to 1.8 cm. No gallbladder wall thickening or pericholecystic fluid. 2. Mildly dilated extrahepatic bile duct at 5 mm. No directly visualized choledocholithiasis although only a  small segment of the bile duct was visible. 3. Diffuse hepatic steatosis. 4. Abdominal aorta and pancreas not well seen due to overlying bowel gas. Electronically Signed   By: Van Clines M.D.   On: 05/10/2016 20:10   I have personally reviewed and evaluated these images and lab results as part of my medical decision-making.   EKG Interpretation   Date/Time:  Sunday May 10 2016 18:07:55 EDT Ventricular Rate:  102 PR Interval:  152 QRS Duration: 82 QT Interval:  346 QTC Calculation: 450 R Axis:   73 Text Interpretation:  Sinus tachycardia Septal infarct , age undetermined  Abnormal ECG since last tracing no significant change Confirmed by Denyce Harr   MD, [place] ([id number]) on 05/10/2016 6:20:29 PM      MDM   Final diagnoses:  None    Must rule out pulmonary embolism, acute cholecystitis or recurrent liver abscess. Labs and imaging pending   The pt had  US showing some Gall stones but no Cholecystitis - she also had labs that were not definitive CT showed that she had no PE or other pulmonary pathology The etiology of her sx is unclear and as I went to the patients room to d/w her the results - I found that she had eloped without obtaining her results and without informing staff.    Noemi Chapel, MD 05/11/16 [phone]

## 2016-05-30 ENCOUNTER — Other Ambulatory Visit: Payer: Self-pay | Admitting: General Surgery

## 2016-05-30 DIAGNOSIS — Z1231 Encounter for screening mammogram for malignant neoplasm of breast: Secondary | ICD-10-CM

## 2016-06-05 DIAGNOSIS — K8020 Calculus of gallbladder without cholecystitis without obstruction: Secondary | ICD-10-CM

## 2016-06-05 HISTORY — DX: Calculus of gallbladder without cholecystitis without obstruction: K80.20

## 2016-06-24 ENCOUNTER — Ambulatory Visit
Admission: RE | Admit: 2016-06-24 | Discharge: 2016-06-24 | Disposition: A | Discharge status: Home / Self Care | Payer: Medicaid Other | Source: Ambulatory Visit | Attending: General Surgery | Admitting: General Surgery

## 2016-06-24 DIAGNOSIS — Z1231 Encounter for screening mammogram for malignant neoplasm of breast: Secondary | ICD-10-CM

## 2016-06-30 ENCOUNTER — Other Ambulatory Visit: Payer: Self-pay | Admitting: General Surgery

## 2016-06-30 DIAGNOSIS — R928 Other abnormal and inconclusive findings on diagnostic imaging of breast: Secondary | ICD-10-CM

## 2016-07-03 ENCOUNTER — Other Ambulatory Visit: Payer: Self-pay | Admitting: General Surgery

## 2016-07-07 ENCOUNTER — Ambulatory Visit
Admission: RE | Admit: 2016-07-07 | Discharge: 2016-07-07 | Disposition: A | Discharge status: Home / Self Care | Payer: Medicaid Other | Source: Ambulatory Visit | Attending: General Surgery | Admitting: General Surgery

## 2016-07-07 ENCOUNTER — Other Ambulatory Visit: Payer: Self-pay | Admitting: General Surgery

## 2016-07-07 DIAGNOSIS — N6489 Other specified disorders of breast: Secondary | ICD-10-CM

## 2016-07-07 DIAGNOSIS — R928 Other abnormal and inconclusive findings on diagnostic imaging of breast: Secondary | ICD-10-CM

## 2016-07-09 ENCOUNTER — Other Ambulatory Visit: Payer: Self-pay | Admitting: General Surgery

## 2016-07-09 DIAGNOSIS — N6489 Other specified disorders of breast: Secondary | ICD-10-CM

## 2016-07-10 ENCOUNTER — Ambulatory Visit
Admission: RE | Admit: 2016-07-10 | Discharge: 2016-07-10 | Disposition: A | Discharge status: Home / Self Care | Payer: Medicaid Other | Source: Ambulatory Visit | Attending: General Surgery | Admitting: General Surgery

## 2016-07-10 DIAGNOSIS — N6489 Other specified disorders of breast: Secondary | ICD-10-CM

## 2016-08-03 ENCOUNTER — Other Ambulatory Visit: Payer: Self-pay | Admitting: General Surgery

## 2016-08-20 DIAGNOSIS — R9439 Abnormal result of other cardiovascular function study: Secondary | ICD-10-CM

## 2016-08-20 HISTORY — DX: Abnormal result of other cardiovascular function study: R94.39

## 2016-09-22 DIAGNOSIS — Z951 Presence of aortocoronary bypass graft: Secondary | ICD-10-CM

## 2016-09-22 HISTORY — DX: Presence of aortocoronary bypass graft: Z95.1

## 2017-01-26 DIAGNOSIS — Z794 Long term (current) use of insulin: Secondary | ICD-10-CM | POA: Insufficient documentation

## 2017-01-26 HISTORY — DX: Long term (current) use of insulin: Z79.4

## 2018-06-10 ENCOUNTER — Emergency Department (HOSPITAL_COMMUNITY): Payer: Self-pay

## 2018-06-10 ENCOUNTER — Encounter (HOSPITAL_COMMUNITY): Payer: Self-pay | Admitting: Emergency Medicine

## 2018-06-10 ENCOUNTER — Emergency Department (HOSPITAL_COMMUNITY)
Admission: EM | Admit: 2018-06-10 | Discharge: 2018-06-10 | Disposition: A | Discharge status: Home / Self Care | Payer: Self-pay | Attending: Emergency Medicine | Admitting: Emergency Medicine

## 2018-06-10 ENCOUNTER — Other Ambulatory Visit: Payer: Self-pay

## 2018-06-10 DIAGNOSIS — Z951 Presence of aortocoronary bypass graft: Secondary | ICD-10-CM | POA: Insufficient documentation

## 2018-06-10 DIAGNOSIS — I10 Essential (primary) hypertension: Secondary | ICD-10-CM | POA: Insufficient documentation

## 2018-06-10 DIAGNOSIS — E119 Type 2 diabetes mellitus without complications: Secondary | ICD-10-CM | POA: Insufficient documentation

## 2018-06-10 DIAGNOSIS — F17210 Nicotine dependence, cigarettes, uncomplicated: Secondary | ICD-10-CM | POA: Insufficient documentation

## 2018-06-10 DIAGNOSIS — Z79899 Other long term (current) drug therapy: Secondary | ICD-10-CM | POA: Insufficient documentation

## 2018-06-10 LAB — CBC
HCT: 52.1 % — ABNORMAL HIGH (ref 36.0–46.0)
Hemoglobin: 16.2 g/dL — ABNORMAL HIGH (ref 12.0–15.0)
MCH: 27 pg (ref 26.0–34.0)
MCHC: 31.1 g/dL (ref 30.0–36.0)
MCV: 86.8 fL (ref 78.0–100.0)
PLATELETS: 164 10*3/uL (ref 150–400)
RBC: 6 MIL/uL — AB (ref 3.87–5.11)
RDW: 13.9 % (ref 11.5–15.5)
WBC: 9.1 10*3/uL (ref 4.0–10.5)

## 2018-06-10 LAB — BASIC METABOLIC PANEL
Anion gap: 5 (ref 5–15)
BUN: 9 mg/dL (ref 6–20)
CALCIUM: 9.2 mg/dL (ref 8.9–10.3)
CHLORIDE: 106 mmol/L (ref 101–111)
CO2: 25 mmol/L (ref 22–32)
CREATININE: 0.67 mg/dL (ref 0.44–1.00)
Glucose, Bld: 336 mg/dL — ABNORMAL HIGH (ref 65–99)
Potassium: 4.5 mmol/L (ref 3.5–5.1)
SODIUM: 136 mmol/L (ref 135–145)

## 2018-06-10 LAB — I-STAT BETA HCG BLOOD, ED (MC, WL, AP ONLY)

## 2018-06-10 LAB — I-STAT TROPONIN, ED: TROPONIN I, POC: 0 ng/mL (ref 0.00–0.08)

## 2018-06-10 MED ORDER — METFORMIN HCL 1000 MG PO TABS: 1000.0000 mg | ORAL_TABLET | Freq: Two times a day (BID) | ORAL | 1 refills | Status: DC

## 2018-06-10 MED ORDER — CARVEDILOL 12.5 MG PO TABS: 12.5000 mg | ORAL_TABLET | Freq: Two times a day (BID) | ORAL | 1 refills | Status: DC

## 2018-06-10 MED ORDER — LISINOPRIL 20 MG PO TABS: 20.0000 mg | ORAL_TABLET | Freq: Every day | ORAL | 1 refills | Status: DC

## 2018-06-10 NOTE — ED Notes (Signed)
Pt ambulatory to restroom with steady gait.

## 2018-06-10 NOTE — Progress Notes (Signed)
Inpatient Diabetes Program Recommendations  AACE/ADA: New Consensus Statement on Inpatient Glycemic Control (2015)  Target Ranges:  Prepandial:   less than 140 mg/dL      Peak postprandial:   less than 180 mg/dL (1-2 hours)      Critically ill patients:  140 - 180 mg/dL   Lab Results  Component Value Date   GLUCAP 160 (H) 03/24/2016   HGBA1C 9.4 (H) 03/11/2016    Review of Glycemic Control Results for [name], [name] (MRN [id number]) as of 06/10/2018 11:55  Ref. Range 06/10/2018 08:10  Glucose Latest Ref Range: 65 - 99 mg/dL 336 (H)   Diabetes history: Type 2 DM Outpatient Diabetes medications: Synjardy 12.04-999 mg BID, Lantus 24 units QD (not reported on 6/21, need to confirm) Current orders for Inpatient glycemic control: none  Inpatient Diabetes Program Recommendations:    Had been seen outpatient for DM with Dr Ronnald Ramp, last appointment was in 01/2017 and A1C at that time was 9.3%.  If to be admitted, consider repeating A1C.   Additionally, BS this AM was 336 mg/dL. If to be admitted, consider adding Novolog 0-9 units TID and Novolog 0-5 units QHS orders under Glycemic control order set. If FSBS remains >180 mg/dL consider adding Lantus 12 units QD.  Thanks, Bronson Curb, MSN, RNC-OB Diabetes Coordinator [phone] (8a-5p)

## 2018-06-10 NOTE — ED Triage Notes (Signed)
Patient to ED c/o high blood pressure this morning while at work. Pt states she wasn't doing anything strenuous, but felt sudden onset SOB, palpitations, lightheaded. She had someone check her BP and it was high despite taking medication for it. Patient states it resolved, but came back a second time and when they checked her BP again, it read 186/153? Hx HTN, cardiomyopathy, CABG in 2017. Patient endorses minor headache right now, denies CP/SOB/dizziness at this time. No extremity weakness, no numbness/tingling, no vision or speech changes. Neuro intact.

## 2018-06-10 NOTE — Discharge Instructions (Addendum)
Prescriptions for diabetes and high blood pressure medicine.  You must get primary care follow-up.

## 2018-06-13 NOTE — ED Provider Notes (Signed)
[facility] EMERGENCY DEPARTMENT Provider Note   CSN: [id number] Arrival date & time: 06/10/18  0746     History   Chief Complaint Chief Complaint  Patient presents with  . Hypertension  . Shortness of Breath    HPI [name] is a 42 y.o. female.  Patient was at work this morning when she felt jittery tongue numbness and swelling.  Blood pressure was extremely elevated at 186/135.  Past medical history includes hypertension, diabetes, CABG x2 vessels  (September 2017), cardiomyopathy, depression, cigarette smoking.  Noncompliant with her blood pressure and diabetes medication.  Review of systems positive for dyspnea, palpitations, lightheadedness.  No chest pain, dyspnea, dizziness in the emergency department.  She feels she is back to baseline.  Severity of symptoms is moderate.  Nothing makes symptoms better or worse.     Past Medical History:  Diagnosis Date  . Abnormal Pap smear   . Anemia   . Cardiomyopathy ([place])    "postpartem cardiomyopathy"- 2012- has since been released by cardiology  . Depression   . Diabetes mellitus   . Hypertension   . Liver abscess    2012- Terrial Rhodes drained placed for awhile.  . Migraine    past x1 -none since.  . Tobacco abuse     Patient Active Problem List   Diagnosis Date Noted  . Acute appendicitis 01/28/2016  . Acute appendicitis with localized peritonitis 01/28/2016  . Renal failure 09/25/2011  . SIRS due to infectious process with acute organ dysfunction ([place]) 09/18/2011  . Obesity 09/18/2011  . Diabetes mellitus type II 08/22/2011  . Liver abscess 08/20/2011  . 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 APPENDECTOMY    . CORONARY ARTERY BYPASS GRAFT  2017   [place]     90's- cervix  . DILATION AND CURETTAGE OF UTERUS  1994  . LAPAROSCOPIC APPENDECTOMY N/A 01/28/2016   Procedure: APPENDECTOMY LAPAROSCOPIC;   Surgeon: Stark Klein, MD;  Location: [facility];  Service: General;  Laterality: N/A;  . ROBOTIC ASSISTED SALPINGO OOPHERECTOMY Right 03/24/2016   Procedure: XI ROBOTIC ASSISTED LAPAROSCOPIC RIGHT SALPINGO OOPHORECTOMY;  Surgeon: Everitt Amber, MD;  Location: [facility];  Service: Gynecology;  Laterality: Right;  . TONSILLECTOMY       OB History    Gravida  5   Para  4   Term  3   Preterm  1   AB  1   Living  4     SAB  1   TAB      Ectopic      Multiple      Live Births  1            Home Medications    Prior to Admission medications   Medication Sig Start Date End Date Taking? Authorizing Provider  guaiFENesin (MUCINEX) 600 MG 12 hr tablet Take 600 mg by mouth as needed for to loosen phlegm.   Yes [provider]  psyllium (METAMUCIL) 58.6 % packet Take 1 packet by mouth as needed (mild constipation).   Yes [provider]  carvedilol (COREG) 12.5 MG tablet Take 1 tablet (12.5 mg total) by mouth 2 (two) times daily with a meal. 06/10/18   Nat Christen, MD  lisinopril (PRINIVIL,ZESTRIL) 20 MG tablet Take 1 tablet (20 mg total) by mouth daily. 06/10/18   Nat Christen, MD  metFORMIN (GLUCOPHAGE) 1000 MG tablet Take 1 tablet (  1,000 mg total) by mouth 2 (two) times daily. 06/10/18   Nat Christen, MD    Family History Family History  Problem Relation Age of Onset  . Diabetes Maternal Grandmother   . Diabetes Paternal Grandfather     Social History Social History   Tobacco Use  . Smoking status: Current Every Day Smoker    Packs/day: 0.50    Years: 21.00    Pack years: 10.50    Types: Cigarettes  . Smokeless tobacco: Never Used  Substance Use Topics  . Alcohol use: No    Comment: rare social  . Drug use: No    Comment: x1 episode "Meth" use.     Allergies   Patient has no known allergies.   Review of Systems Review of Systems  All other systems reviewed and are negative.    Physical Exam Updated Vital Signs BP (!) 150/81   Pulse 74    Temp 98.1 F (36.7 C) (Oral)   Resp 19   Ht 5\' 7"  (1.702 m)   Wt 100.7 kg (222 lb)   LMP 06/07/2018 (Exact Date)   SpO2 98%   BMI 34.77 kg/m   Physical Exam  Constitutional: She is oriented to person, place, and time.  No acute distress in the ED.  HENT:  Head: Normocephalic and atraumatic.  Eyes: Conjunctivae are normal.  Neck: Neck supple.  Cardiovascular: Normal rate and regular rhythm.  Pulmonary/Chest: Effort normal and breath sounds normal.  Abdominal: Soft. Bowel sounds are normal.  Musculoskeletal: Normal range of motion.  Neurological: She is alert and oriented to person, place, and time.  Skin: Skin is warm and dry.  Psychiatric: She has a normal mood and affect. Her behavior is normal.  Nursing note and vitals reviewed.    ED Treatments / Results  Labs (all labs ordered are listed, but only abnormal results are displayed) Labs Reviewed  BASIC METABOLIC PANEL - Abnormal; Notable for the following components:      Result Value   Glucose, Bld 336 (*)    All other components within normal limits  CBC - Abnormal; Notable for the following components:   RBC 6.00 (*)    Hemoglobin 16.2 (*)    HCT 52.1 (*)    All other components within normal limits  I-STAT TROPONIN, ED  I-STAT BETA HCG BLOOD, ED ([facility], [facility], [facility] ONLY)    EKG EKG Interpretation  Date/Time:  Friday June 10 2018 08:07:34 EDT Ventricular Rate:  91 PR Interval:  150 QRS Duration: 86 QT Interval:  360 QTC Calculation: 442 R Axis:   34 Text Interpretation:  Normal sinus rhythm ST & T wave abnormality, consider lateral ischemia Abnormal ECG Confirmed by Nat Christen [phone]) on 06/10/2018 11:05:04 AM   Radiology No results found.  Procedures Procedures (including critical care time)  Medications Ordered in ED Medications - No data to display   Initial Impression / Assessment and Plan / ED Course  I have reviewed the triage vital signs and the nursing notes.  Pertinent labs & imaging results  that were available during my care of the patient were reviewed by me and considered in my medical decision making (see chart for details).     Patient presents with a hypertensive crisis which is resolved over time.  She has been noncompliant with her blood pressure and diabetes medication.  She was observed for several hours.  EKG normal.  Chest x-ray shows no edema or consolidation.  Glucose elevated at 336.  EKG and  troponin negative.  Will restart Coreg, lisinopril, metformin.  Patient strongly encouraged to get primary care follow-up.  No evidence of a stroke or MI at discharge.  Final Clinical Impressions(s) / ED Diagnoses   Final diagnoses:  Hypertension, unspecified type  Type 2 diabetes mellitus without complication, without long-term current use of insulin [facility])    ED Discharge Orders        Ordered    carvedilol (COREG) 12.5 MG tablet  2 times daily with meals     06/10/18 1521    metFORMIN (GLUCOPHAGE) 1000 MG tablet  2 times daily     06/10/18 1521    lisinopril (PRINIVIL,ZESTRIL) 20 MG tablet  Daily     06/10/18 1521       Nat Christen, MD 06/13/18 2026

## 2020-06-07 ENCOUNTER — Other Ambulatory Visit (HOSPITAL_COMMUNITY): Payer: 59

## 2020-06-07 ENCOUNTER — Encounter (HOSPITAL_COMMUNITY)
Admission: EM | Disposition: A | Discharge status: Home / Self Care | Payer: Self-pay | Source: Home / Self Care | Attending: Emergency Medicine

## 2020-06-07 ENCOUNTER — Other Ambulatory Visit: Payer: Self-pay

## 2020-06-07 ENCOUNTER — Observation Stay (HOSPITAL_COMMUNITY)
Admission: EM | Admit: 2020-06-07 | Discharge: 2020-06-08 | Disposition: A | Discharge status: Home / Self Care | Payer: 59 | Attending: Internal Medicine | Admitting: Internal Medicine

## 2020-06-07 ENCOUNTER — Encounter (HOSPITAL_COMMUNITY): Payer: Self-pay | Admitting: Internal Medicine

## 2020-06-07 ENCOUNTER — Emergency Department (HOSPITAL_COMMUNITY): Payer: 59

## 2020-06-07 DIAGNOSIS — I25810 Atherosclerosis of coronary artery bypass graft(s) without angina pectoris: Secondary | ICD-10-CM

## 2020-06-07 DIAGNOSIS — I214 Non-ST elevation (NSTEMI) myocardial infarction: Principal | ICD-10-CM | POA: Diagnosis present

## 2020-06-07 DIAGNOSIS — Z955 Presence of coronary angioplasty implant and graft: Secondary | ICD-10-CM | POA: Diagnosis not present

## 2020-06-07 DIAGNOSIS — Z20822 Contact with and (suspected) exposure to covid-19: Secondary | ICD-10-CM | POA: Diagnosis not present

## 2020-06-07 DIAGNOSIS — I110 Hypertensive heart disease with heart failure: Secondary | ICD-10-CM | POA: Diagnosis not present

## 2020-06-07 DIAGNOSIS — E785 Hyperlipidemia, unspecified: Secondary | ICD-10-CM | POA: Insufficient documentation

## 2020-06-07 DIAGNOSIS — F329 Major depressive disorder, single episode, unspecified: Secondary | ICD-10-CM | POA: Diagnosis not present

## 2020-06-07 DIAGNOSIS — Z79899 Other long term (current) drug therapy: Secondary | ICD-10-CM | POA: Insufficient documentation

## 2020-06-07 DIAGNOSIS — Z794 Long term (current) use of insulin: Secondary | ICD-10-CM | POA: Diagnosis not present

## 2020-06-07 DIAGNOSIS — I5042 Chronic combined systolic (congestive) and diastolic (congestive) heart failure: Secondary | ICD-10-CM | POA: Diagnosis not present

## 2020-06-07 DIAGNOSIS — Z9114 Patient's other noncompliance with medication regimen: Secondary | ICD-10-CM | POA: Insufficient documentation

## 2020-06-07 DIAGNOSIS — I255 Ischemic cardiomyopathy: Secondary | ICD-10-CM | POA: Insufficient documentation

## 2020-06-07 DIAGNOSIS — F17210 Nicotine dependence, cigarettes, uncomplicated: Secondary | ICD-10-CM | POA: Diagnosis not present

## 2020-06-07 DIAGNOSIS — E119 Type 2 diabetes mellitus without complications: Secondary | ICD-10-CM | POA: Insufficient documentation

## 2020-06-07 DIAGNOSIS — I2510 Atherosclerotic heart disease of native coronary artery without angina pectoris: Secondary | ICD-10-CM | POA: Diagnosis not present

## 2020-06-07 HISTORY — PX: LEFT HEART CATH AND CORS/GRAFTS ANGIOGRAPHY: CATH118250

## 2020-06-07 HISTORY — DX: Non-ST elevation (NSTEMI) myocardial infarction: I21.4

## 2020-06-07 LAB — PROTIME-INR
INR: 0.9 (ref 0.8–1.2)
Prothrombin Time: 12.1 seconds (ref 11.4–15.2)

## 2020-06-07 LAB — BASIC METABOLIC PANEL
Anion gap: 7 (ref 5–15)
BUN: 11 mg/dL (ref 6–20)
CO2: 23 mmol/L (ref 22–32)
Calcium: 9 mg/dL (ref 8.9–10.3)
Chloride: 106 mmol/L (ref 98–111)
Creatinine, Ser: 0.67 mg/dL (ref 0.44–1.00)
GFR calc Af Amer: >60 mL/min (ref >60)
GFR calc non Af Amer: >60 mL/min (ref >60)
Glucose, Bld: 265 mg/dL — ABNORMAL HIGH (ref 70–99)
Potassium: 4.5 mmol/L (ref 3.5–5.1)
Sodium: 136 mmol/L (ref 135–145)

## 2020-06-07 LAB — HEPATIC FUNCTION PANEL
ALT: 17 U/L (ref 0–44)
AST: 14 U/L — ABNORMAL LOW (ref 15–41)
Albumin: 3.8 g/dL (ref 3.5–5.0)
Alkaline Phosphatase: 47 U/L (ref 38–126)
Bilirubin, Direct: 0.2 mg/dL (ref 0.0–0.2)
Indirect Bilirubin: 0.1 mg/dL — ABNORMAL LOW (ref 0.3–0.9)
Total Bilirubin: 0.3 mg/dL (ref 0.3–1.2)
Total Protein: 6.5 g/dL (ref 6.5–8.1)

## 2020-06-07 LAB — BRAIN NATRIURETIC PEPTIDE: B Natriuretic Peptide: 173.4 pg/mL — ABNORMAL HIGH (ref 0.0–100.0)

## 2020-06-07 LAB — CBC
HCT: 49.9 % — ABNORMAL HIGH (ref 36.0–46.0)
Hemoglobin: 15.4 g/dL — ABNORMAL HIGH (ref 12.0–15.0)
MCH: 26.4 pg (ref 26.0–34.0)
MCHC: 30.9 g/dL (ref 30.0–36.0)
MCV: 85.6 fL (ref 80.0–100.0)
Platelets: 161 10*3/uL (ref 150–400)
RBC: 5.83 MIL/uL — ABNORMAL HIGH (ref 3.87–5.11)
RDW: 15.7 % — ABNORMAL HIGH (ref 11.5–15.5)
WBC: 9.1 10*3/uL (ref 4.0–10.5)
nRBC: 0 % (ref 0.0–0.2)

## 2020-06-07 LAB — GLUCOSE, CAPILLARY
Glucose-Capillary: 149 mg/dL — ABNORMAL HIGH (ref 70–99)
Glucose-Capillary: 233 mg/dL — ABNORMAL HIGH (ref 70–99)
Glucose-Capillary: 210 mg/dL — ABNORMAL HIGH (ref 70–99)

## 2020-06-07 LAB — TROPONIN I (HIGH SENSITIVITY)
Troponin I (High Sensitivity): 227 ng/L (ref <18)
Troponin I (High Sensitivity): 404 ng/L (ref <18)

## 2020-06-07 LAB — I-STAT BETA HCG BLOOD, ED (MC, WL, AP ONLY): I-stat hCG, quantitative: <5 m[IU]/mL (ref <5)

## 2020-06-07 LAB — LIPID PANEL
Cholesterol: 178 mg/dL (ref 0–200)
HDL: 36 mg/dL — ABNORMAL LOW (ref >40)
LDL Cholesterol: 119 mg/dL — ABNORMAL HIGH (ref 0–99)
Total CHOL/HDL Ratio: 4.9 RATIO
Triglycerides: 115 mg/dL (ref <150)
VLDL: 23 mg/dL (ref 0–40)

## 2020-06-07 LAB — CBG MONITORING, ED: Glucose-Capillary: 177 mg/dL — ABNORMAL HIGH (ref 70–99)

## 2020-06-07 LAB — HEMOGLOBIN A1C
Hgb A1c MFr Bld: 9.6 % — ABNORMAL HIGH (ref 4.8–5.6)
Mean Plasma Glucose: 228.82 mg/dL

## 2020-06-07 LAB — SARS CORONAVIRUS 2 BY RT PCR (HOSPITAL ORDER, PERFORMED IN ~~LOC~~ HOSPITAL LAB): SARS Coronavirus 2: NEGATIVE

## 2020-06-07 LAB — HEPARIN LEVEL (UNFRACTIONATED): Heparin Unfractionated: <0.1 IU/mL — ABNORMAL LOW (ref 0.30–0.70)

## 2020-06-07 LAB — HIV ANTIBODY (ROUTINE TESTING W REFLEX): HIV Screen 4th Generation wRfx: NONREACTIVE

## 2020-06-07 LAB — LIPASE, BLOOD: Lipase: 33 U/L (ref 11–51)

## 2020-06-07 SURGERY — LEFT HEART CATH AND CORS/GRAFTS ANGIOGRAPHY
Anesthesia: LOCAL

## 2020-06-07 MED ORDER — HEPARIN SODIUM (PORCINE) 1000 UNIT/ML IJ SOLN
INTRAMUSCULAR | Status: DC | PRN
  Administered 2020-06-07: 5000 [IU] via INTRAVENOUS

## 2020-06-07 MED ORDER — NITROGLYCERIN IN D5W 200-5 MCG/ML-% IV SOLN
0.0000 ug/min | INTRAVENOUS | Status: DC
  Administered 2020-06-07: 5 ug/min via INTRAVENOUS
  Filled 2020-06-07: qty 250

## 2020-06-07 MED ORDER — CARVEDILOL 6.25 MG PO TABS
6.2500 mg | ORAL_TABLET | Freq: Two times a day (BID) | ORAL | Status: DC
  Administered 2020-06-08: 6.25 mg via ORAL
  Filled 2020-06-07: qty 1

## 2020-06-07 MED ORDER — ATORVASTATIN CALCIUM 80 MG PO TABS
80.0000 mg | ORAL_TABLET | Freq: Every day | ORAL | Status: DC
  Administered 2020-06-07 – 2020-06-08 (×2): 80 mg via ORAL
  Filled 2020-06-07 (×2): qty 1

## 2020-06-07 MED ORDER — ENOXAPARIN SODIUM 40 MG/0.4ML ~~LOC~~ SOLN
40.0000 mg | SUBCUTANEOUS | Status: DC
  Administered 2020-06-08: 40 mg via SUBCUTANEOUS
  Filled 2020-06-07: qty 0.4

## 2020-06-07 MED ORDER — INSULIN ASPART 100 UNIT/ML ~~LOC~~ SOLN
0.0000 [IU] | SUBCUTANEOUS | Status: DC
  Administered 2020-06-07: 5 [IU] via SUBCUTANEOUS
  Administered 2020-06-08: 3 [IU] via SUBCUTANEOUS
  Administered 2020-06-08: 2 [IU] via SUBCUTANEOUS
  Administered 2020-06-08: 3 [IU] via SUBCUTANEOUS
  Administered 2020-06-08: 8 [IU] via SUBCUTANEOUS

## 2020-06-07 MED ORDER — IOHEXOL 350 MG/ML SOLN
INTRAVENOUS | Status: AC
  Filled 2020-06-07: qty 1

## 2020-06-07 MED ORDER — SODIUM CHLORIDE 0.9 % IV SOLN: 250.0000 mL | INTRAVENOUS | Status: DC | PRN

## 2020-06-07 MED ORDER — INSULIN GLARGINE 100 UNIT/ML ~~LOC~~ SOLN
10.0000 [IU] | Freq: Every day | SUBCUTANEOUS | Status: DC
  Administered 2020-06-07: 10 [IU] via SUBCUTANEOUS
  Filled 2020-06-07 (×3): qty 0.1

## 2020-06-07 MED ORDER — FENTANYL CITRATE (PF) 100 MCG/2ML IJ SOLN
INTRAMUSCULAR | Status: DC | PRN
  Administered 2020-06-07 (×2): 25 ug via INTRAVENOUS

## 2020-06-07 MED ORDER — ACETAMINOPHEN 325 MG PO TABS
650.0000 mg | ORAL_TABLET | Freq: Four times a day (QID) | ORAL | Status: DC | PRN
  Administered 2020-06-07: 650 mg via ORAL
  Filled 2020-06-07: qty 2

## 2020-06-07 MED ORDER — IOHEXOL 350 MG/ML SOLN
INTRAVENOUS | Status: DC | PRN
  Administered 2020-06-07: 55 mL

## 2020-06-07 MED ORDER — ASPIRIN 81 MG PO CHEW
324.0000 mg | CHEWABLE_TABLET | Freq: Once | ORAL | Status: AC
  Administered 2020-06-07: 324 mg via ORAL
  Filled 2020-06-07: qty 4

## 2020-06-07 MED ORDER — SODIUM CHLORIDE 0.9% FLUSH
3.0000 mL | Freq: Two times a day (BID) | INTRAVENOUS | Status: DC
  Administered 2020-06-07: 3 mL via INTRAVENOUS

## 2020-06-07 MED ORDER — SODIUM CHLORIDE 0.9 % WEIGHT BASED INFUSION
1.0000 mL/kg/h | INTRAVENOUS | Status: AC
  Administered 2020-06-07: 1 mL/kg/h via INTRAVENOUS

## 2020-06-07 MED ORDER — SODIUM CHLORIDE 0.9% FLUSH: 3.0000 mL | INTRAVENOUS | Status: DC | PRN

## 2020-06-07 MED ORDER — MIDAZOLAM HCL 2 MG/2ML IJ SOLN
INTRAMUSCULAR | Status: DC | PRN
  Administered 2020-06-07 (×2): 1 mg via INTRAVENOUS

## 2020-06-07 MED ORDER — SODIUM CHLORIDE 0.9% FLUSH
3.0000 mL | Freq: Two times a day (BID) | INTRAVENOUS | Status: DC
  Administered 2020-06-08: 3 mL via INTRAVENOUS

## 2020-06-07 MED ORDER — SODIUM CHLORIDE 0.9 % WEIGHT BASED INFUSION: 3.0000 mL/kg/h | INTRAVENOUS | Status: DC

## 2020-06-07 MED ORDER — SODIUM CHLORIDE 0.9% FLUSH: 3.0000 mL | Freq: Two times a day (BID) | INTRAVENOUS | Status: DC

## 2020-06-07 MED ORDER — SODIUM CHLORIDE 0.9 % WEIGHT BASED INFUSION: 1.0000 mL/kg/h | INTRAVENOUS | Status: DC

## 2020-06-07 MED ORDER — ACETAMINOPHEN 325 MG PO TABS
325.0000 mg | ORAL_TABLET | Freq: Once | ORAL | Status: AC
  Administered 2020-06-07: 325 mg via ORAL
  Filled 2020-06-07: qty 1

## 2020-06-07 MED ORDER — BUSPIRONE HCL 10 MG PO TABS
10.0000 mg | ORAL_TABLET | Freq: Two times a day (BID) | ORAL | Status: DC
  Administered 2020-06-07 – 2020-06-08 (×2): 10 mg via ORAL
  Filled 2020-06-07 (×2): qty 1

## 2020-06-07 MED ORDER — MIDAZOLAM HCL 2 MG/2ML IJ SOLN
INTRAMUSCULAR | Status: AC
  Filled 2020-06-07: qty 2

## 2020-06-07 MED ORDER — HEPARIN (PORCINE) IN NACL 1000-0.9 UT/500ML-% IV SOLN
INTRAVENOUS | Status: DC | PRN
  Administered 2020-06-07 (×2): 500 mL

## 2020-06-07 MED ORDER — LIDOCAINE HCL (PF) 1 % IJ SOLN
INTRAMUSCULAR | Status: DC | PRN
  Administered 2020-06-07: 2 mL via INTRADERMAL

## 2020-06-07 MED ORDER — HEPARIN (PORCINE) 25000 UT/250ML-% IV SOLN
1000.0000 [IU]/h | INTRAVENOUS | Status: DC
  Administered 2020-06-07: 1000 [IU]/h via INTRAVENOUS
  Filled 2020-06-07: qty 250

## 2020-06-07 MED ORDER — HEPARIN (PORCINE) IN NACL 1000-0.9 UT/500ML-% IV SOLN
INTRAVENOUS | Status: AC
  Filled 2020-06-07: qty 1000

## 2020-06-07 MED ORDER — LIDOCAINE HCL (PF) 1 % IJ SOLN
INTRAMUSCULAR | Status: AC
  Filled 2020-06-07: qty 30

## 2020-06-07 MED ORDER — VERAPAMIL HCL 2.5 MG/ML IV SOLN
INTRAVENOUS | Status: AC
  Filled 2020-06-07: qty 2

## 2020-06-07 MED ORDER — FENTANYL CITRATE (PF) 100 MCG/2ML IJ SOLN
INTRAMUSCULAR | Status: AC
  Filled 2020-06-07: qty 2

## 2020-06-07 MED ORDER — GABAPENTIN 300 MG PO CAPS
300.0000 mg | ORAL_CAPSULE | Freq: Every day | ORAL | Status: DC
  Administered 2020-06-08: 300 mg via ORAL
  Filled 2020-06-07: qty 1

## 2020-06-07 MED ORDER — HEPARIN BOLUS VIA INFUSION
4000.0000 [IU] | Freq: Once | INTRAVENOUS | Status: AC
  Administered 2020-06-07: 4000 [IU] via INTRAVENOUS
  Filled 2020-06-07: qty 4000

## 2020-06-07 MED ORDER — ASPIRIN 81 MG PO CHEW: 81.0000 mg | CHEWABLE_TABLET | ORAL | Status: DC

## 2020-06-07 MED ORDER — VERAPAMIL HCL 2.5 MG/ML IV SOLN
INTRAVENOUS | Status: DC | PRN
  Administered 2020-06-07: 10 mL via INTRA_ARTERIAL

## 2020-06-07 MED ORDER — SODIUM CHLORIDE 0.9% FLUSH: 3.0000 mL | Freq: Once | INTRAVENOUS | Status: DC

## 2020-06-07 SURGICAL SUPPLY — 12 items
CATH INFINITI 5 FR JL3.5 (CATHETERS) ×2 IMPLANT
CATH INFINITI 5 FR RCB (CATHETERS) ×2 IMPLANT
CATH INFINITI 5FR MULTPACK ANG (CATHETERS) ×2 IMPLANT
DEVICE RAD COMP TR BAND LRG (VASCULAR PRODUCTS) ×2 IMPLANT
GLIDESHEATH SLEND SS 6F .021 (SHEATH) ×2 IMPLANT
GUIDEWIRE INQWIRE 1.5J.035X260 (WIRE) ×1 IMPLANT
INQWIRE 1.5J .035X260CM (WIRE) ×2
KIT HEART LEFT (KITS) ×2 IMPLANT
PACK CARDIAC CATHETERIZATION (CUSTOM PROCEDURE TRAY) ×2 IMPLANT
TRANSDUCER W/STOPCOCK (MISCELLANEOUS) ×2 IMPLANT
TUBING CIL FLEX 10 FLL-RA (TUBING) ×2 IMPLANT
WIRE HI TORQ VERSACORE-J 145CM (WIRE) ×2 IMPLANT

## 2020-06-07 NOTE — H&P (Addendum)
Date: 06/07/2020               Patient Name:  [name] MRN: [id number]  DOB: 1976-07-01 Age / Sex: 44 y.o., female   PCP: System, Pcp Not In         Medical Service: Internal Medicine Teaching Service         Attending Physician: Dr. Heber Tate, Rachel Moulds, DO    First Contact: Dr. Marianna Payment Pager: [phone]  Second Contact: Dr. Sharon Seller Pager: [phone]       After Hours (After 5p/  First Contact Pager: [phone]  weekends / holidays): Second Contact Pager: [phone]   Chief Complaint: Chest Pain  History of Present Illness: [name] is a 44 yo F with Hx of CAD (s/p CABG in 2017), HTN, and Diabetes who presents with chest pain. Pain started as 7pm yesterday evening.  The pain is described as a 9/10 central chest tightness with associated SOB and Nausea. She thought it may have been her bra but the pain persisted after loosing her bra.This first episode improved over time and she was able to go to sleep. She became to have pain again around 6 am on her way to work. The pain worsened over the next hour at work and began to radiate to her left arm (with feeling of heaviness in her arm) at which point she decided to come to the hospital. The pain has since improved in the ED. She reports headache that has improved as well.  She denies fever, chills, diaphoresis, vomiting, constipation, diarrhea, abdominal pain. Of note she is s/p CABG at [facility] in 2017 and she has been unable to take her medications for about 2 years due to financial difficulties; but did start seeing a new PCP in March and has started Insulin for her DM.  ED workup showed: Glucose 265, Hgb 15, Trop 227. Other labs pending. EKG showed inferior lateral T-Wave changes. Cardiology was consulted in the ED and will see patient with IMTS to admit.  Meds: Current Meds  Medication Sig  . busPIRone (BUSPAR) 10 MG tablet Take 10 mg by mouth 2 (two) times daily.  Marland Kitchen gabapentin (NEURONTIN) 300 MG capsule Take 300 mg by mouth daily.  . insulin  degludec (TRESIBA FLEXTOUCH) 100 UNIT/ML FlexTouch Pen Inject 18 Units into the skin at bedtime.  . insulin lispro (HUMALOG) 100 UNIT/ML injection Inject 2-10 Units into the skin 3 (three) times daily before meals. Per sliding scale: CBG 150-200 = 2 units,  201-250= 4 units, 251-300= 6 units, 301-350= 8 units, 351-400 = 10 units, above 400 call MD   Allergies: Allergies as of 06/07/2020  . (No Known Allergies)   Past Medical History:  Diagnosis Date  . Abnormal Pap smear   . Anemia   . Cardiomyopathy ([place])    "postpartem cardiomyopathy"- 2012- has since been released by cardiology  . Depression   . Diabetes mellitus   . Hypertension   . Liver abscess    2012- Terrial Rhodes drained placed for awhile.  . Migraine    past x1 -none since.  . Tobacco abuse     Family History:  Family History  Problem Relation Age of Onset  . Diabetes Maternal Grandmother   . Diabetes Paternal Grandfather    Social History:  Social History   Tobacco Use  . Smoking status: Current Every Day Smoker    Packs/day: 0.50    Years: 21.00    Pack years: 10.50    Types:  Cigarettes  . Smokeless tobacco: Never Used  Substance Use Topics  . Alcohol use: No    Comment: rare social  . Drug use: No    Comment: x1 episode "Meth" use.   Review of Systems: A complete ROS was negative except as per HPI.  Physical Exam: Blood pressure (!) 163/78, pulse 86, temperature 98.5 F (36.9 C), temperature source Oral, resp. rate 18, height 5\' 7"  (1.702 m), weight 97.1 kg, SpO2 98 %. Physical Exam Constitutional:      General: She is not in acute distress.    Appearance: Normal appearance.  Cardiovascular:     Rate and Rhythm: Normal rate and regular rhythm.     Pulses: Normal pulses.     Heart sounds: Normal heart sounds.  Pulmonary:     Effort: Pulmonary effort is normal. No respiratory distress.     Breath sounds: Normal breath sounds.  Abdominal:     General: Bowel sounds are normal. There is no  distension.     Palpations: Abdomen is soft.     Tenderness: There is no abdominal tenderness.  Musculoskeletal:        General: No swelling or deformity.  Skin:    General: Skin is warm and dry.  Neurological:     General: No focal deficit present.     Mental Status: Mental status is at baseline.     EKG: personally reviewed my interpretation is sinus rhythm with inferolateral t-wave flattening/inversion.  CXR: Unable to open PACS. Report states: IMPRESSION: 1. Prior median sternotomy. Heart size stable. No pulmonary venous congestion. 2. No acute pulmonary infiltrate. Stable chronic interstitial changes.  Assessment & Plan by Problem: Active Problems:   NSTEMI (non-ST elevated myocardial infarction) ([place])  NSTEMI CAD s/p CABG in 2017: Chest pain, Nausea, Radiation to left arm. New T-wave change in inferolateral leads. History of CABG x2 in 2017 at [facility]. Trop 227. Smokes a pack a day. Has HTN, Diabetes. Parents both have CAD and stents. Cardiology consulted in ED. Patient started on Nitro gtt, Heparin gtt in ED. - Appreciate cardiology recommendations - Continue Heparin Gtt - Continue Nitro gtt - Atorvastatin 80mg  Daily - Start beta blocker pending BNP - Diet, sips with meds for now - F/U repeat hsTrop - F/U BNP - A1c, Lipid Panel  HTN: BP 160 in ED, no apparent history. Not on meds currently. - Monitor - Plan to start ACE/ARB and BB given cardiac histroy  DM: On 18U and SSI at home - Lantus 10U - SSI q4h while NPO - CBGs - A1c  FEN NPO VTE ppx: Heparin Code Status: FULL   Dispo: Admit patient to Observation with expected length of stay less than 2 midnights.  Signed: Neva Seat, MD 06/07/2020, 11:20 AM  After 5pm on weekdays and 1pm on weekends: On Call pager: [phone]

## 2020-06-07 NOTE — Consult Note (Addendum)
Cardiology Consultation:   Patient ID: [name] MRN: [id number]; DOB: November 23, 1976  Admit date: 06/07/2020 Date of Consult: 06/07/2020  Primary Care Provider: System, Pcp Not In [place] Cardiologist: Dr. Bettina Gavia (Last seen 2018)   Patient Profile:   [name] is a 44 y.o. female with a hx of CAD s/p CABG September 2017, ischemic cardiomyopathy, diabetes mellitus, hypertension, ongoing tobacco smoking and hyperlipidemia who is being seen today for the evaluation of non-STEMI at the request of Dr. Heber Savage.  History of postpartum cardiomyopathy in 2012.  [name] was followed by Dr. Einar Gip and lost follow-up.  Patient established care in [facility] in 2017 for chest pain.  [name] had a abnormal stress test and eventually underwent CABG x2 (LIMA to LAD and SVG to PDA) September 2017 at [facility]  Last echocardiogram March 2018 showed LV function of 40 to 45%.  Last seen by cardiologist 05/2017.  [name] lost insurance and cardiolog follow-up.  [name] is not taking her cardiac medication for greater than 2 years.  Recently established care with PCP and only started on diabetic medications.  History of Present Illness:   [name] has intermittent substernal chest pressure for past couple of months.  Initially it was exertional and relieved with rest.  Last night at 7 PM [name] had a severe substernal squeezing pressure radiating to her shoulder blade.  It was on and off all night.  [name] went to work this morning however had recurrent symptoms and brought to ER for further evaluation.  BNP 173.  High-sensitivity troponin 227>>404.  Creatinine normal.  Covid negative.  Chest x-ray with chronic interstitial changes.  Patient reports noncompliance with medication.  [name] smokes 1 pack of cigarette every day.  Denies illicit drug use or alcohol abuse.  Chest pain improved on IV heparin and nitroglycerin drip.   Past Medical History:  Diagnosis Date  . Abnormal Pap smear   . Anemia   .  Cardiomyopathy ([place])    "postpartem cardiomyopathy"- 2012- has since been released by cardiology  . Depression   . Diabetes mellitus   . Hypertension   . Liver abscess    2012- Terrial Rhodes drained placed for awhile.  . Migraine    past x1 -none since.  . Tobacco abuse     Past Surgical History:  Procedure Laterality Date  . APPENDECTOMY    . CORONARY ARTERY BYPASS GRAFT  2017   [place]     90's- cervix  . DILATION AND CURETTAGE OF UTERUS  1994  . LAPAROSCOPIC APPENDECTOMY N/A 01/28/2016   Procedure: APPENDECTOMY LAPAROSCOPIC;  Surgeon: Stark Klein, MD;  Location: [place];  Service: General;  Laterality: N/A;  . ROBOTIC ASSISTED SALPINGO OOPHERECTOMY Right 03/24/2016   Procedure: XI ROBOTIC ASSISTED LAPAROSCOPIC RIGHT SALPINGO OOPHORECTOMY;  Surgeon: Everitt Amber, MD;  Location: [facility];  Service: Gynecology;  Laterality: Right;  . TONSILLECTOMY        Inpatient Medications: Scheduled Meds: . atorvastatin  80 mg Oral Daily  . busPIRone  10 mg Oral BID  . [START ON 06/08/2020] gabapentin  300 mg Oral Daily  . insulin aspart  0-15 Units Subcutaneous Q4H  . insulin glargine  10 Units Subcutaneous QHS  . sodium chloride flush  3 mL Intravenous Once  . sodium chloride flush  3 mL Intravenous Q12H   Continuous Infusions: . heparin 1,000 Units/hr (06/07/20 1035)  . nitroGLYCERIN 5 mcg/min (06/07/20 1027)   PRN Meds:   Allergies:  No Known Allergies  Social History:   Social History   Socioeconomic History  . Marital status: Single    Spouse name: Not on file  . Number of children: Not on file  . Years of education: Not on file  . Highest education level: Not on file  Occupational History  . Not on file  Tobacco Use  . Smoking status: Current Every Day Smoker    Packs/day: 0.50    Years: 21.00    Pack years: 10.50    Types: Cigarettes  . Smokeless tobacco: Never Used  Substance and Sexual Activity  . Alcohol use: No    Comment:  rare social  . Drug use: No    Comment: x1 episode "Meth" use.  Marland Kitchen Sexual activity: Not Currently    Birth control/protection: None  Other Topics Concern  . Not on file  Social History Narrative  . Not on file   Social Determinants of Health   Financial Resource Strain:   . Difficulty of Paying Living Expenses:   Food Insecurity:   . Worried About Charity fundraiser in the Last Year:   . Arboriculturist in the Last Year:   Transportation Needs:   . Film/video editor (Medical):   Marland Kitchen Lack of Transportation (Non-Medical):   Physical Activity:   . Days of Exercise per Week:   . Minutes of Exercise per Session:   Stress:   . Feeling of Stress :   Social Connections:   . Frequency of Communication with Friends and Family:   . Frequency of Social Gatherings with Friends and Family:   . Attends Religious Services:   . Active Member of Clubs or Organizations:   . Attends Archivist Meetings:   Marland Kitchen Marital Status:   Intimate Partner Violence:   . Fear of Current or Ex-Partner:   . Emotionally Abused:   Marland Kitchen Physically Abused:   . Sexually Abused:     Family History:   Family History  Problem Relation Age of Onset  . Diabetes Maternal Grandmother   . Diabetes Paternal Grandfather      ROS:  Please see the history of present illness.  All other ROS reviewed and negative.     Physical Exam/Data:   Vitals:   06/07/20 0803 06/07/20 0900 06/07/20 1200  BP: (!) 163/78  (!) 151/77  Pulse: 86  83  Resp: 18  (!) 23  Temp: 98.5 F (36.9 C)    TempSrc: Oral    SpO2: 98%  97%  Weight:  97.1 kg   Height:  5\' 7"  (1.702 m)    No intake or output data in the 24 hours ending 06/07/20 1252 Last 3 Weights 06/07/2020 06/10/2018 05/10/2016  Weight (lbs) 214 lb 222 lb 230 lb  Weight (kg) 97.07 kg 100.699 kg 104.327 kg     Body mass index is 33.52 kg/m.  General:  Well nourished, well developed, in no acute distress HEENT: normal Lymph: no adenopathy Neck: no  JVD Endocrine:  No thryomegaly Vascular: No carotid bruits; FA pulses 2+ bilaterally without bruits  Cardiac:  normal S1, S2; RRR; no murmur  Lungs:  clear to auscultation bilaterally, no wheezing, rhonchi or rales  Abd: soft, nontender, no hepatomegaly  Ext: Trace edema Musculoskeletal:  No deformities, BUE and BLE strength normal and equal Skin: warm and dry  Neuro:  CNs 2-12 intact, no focal abnormalities noted Psych:  Normal affect   EKG:  The EKG was personally reviewed and  demonstrates: Sinus rhythm at rate of 97 bpm, T wave inversion in lateral leads Telemetry:  Telemetry was personally reviewed and demonstrates:  SR with PVCs  Relevant CV Studies:  Cath 08/2016 Diagnostic Procedure Summary  1. CTO prox LAD with collateral.  2. Severe, diffuse Distal RCS lesion  3. Inferior wall hypokinesis, LVEF 40%  Diagnostic Procedure Recommendations  CTS consult for CABG  I have reviewed the recent history and physical documentation. I personally  spent 23 minutes continuously monitoring the patient during the administration  of moderate sedation. Pre and post activities have been reviewed. I was  present for the entire procedure.  Signatures  Electronically signed by Rozann Lesches, MD(Diagnostic Physician) on  08/27/2016 10:24  Angiographic findings  Cardiac Arteries and Lesion Findings  LMCA: 0%.  LAD:  Lesion on Prox LAD: 100% stenosis.  LCx:  Lesion on Prox CX: 20% stenosis.  RCA:  Lesion on Dist RCA: Distal subsection.95% stenosis. Pre procedure TIMI III  flow was noted. The lesion was diffuse.  Procedure Data  Procedure Date  Date: 08/27/2016 Start: 09:47  Entry Locations  - Percutaneous access was performed through the Right Radial artery. A 5 Fr   sheath was inserted.  Diagnostic Catheters  - 18F JR 4.0 IMPULSE Diagnostic Catheter  - 18F PIG IMPULSE Diagnostic Catheter  Fluoroscopy Time: Diagnostic: 5:01 minutes. Total: 5:01 minutes.  Admission  Data  Admission Date: 08/27/2016  Coronary Tree  Dominance: Right  VA  LV function assessed DG:[id number].  Ejection Fraction  - Method: LV gram. EF%: 45.  LVA Segment Contractility  1 - Normal    3 - Mild    5 - Severe   7 - Dyskinesis  hypokinesis   hypokinesis  2 - Hypokinesis 4 - Moderate  6 - Akinesis  8 - Aneurysm  hypokinesis  Hemodynamics  Condition: Rest  Heart Rate: 90 bpm  Pressures  +-----+-------------+  !Site !Pressure   !  +-----+-------------+  !AO  !206/136 (142)!  +-----+-------------+  !LV  !103/0 ,7   !  +-----+-------------+  !LV  !101/-1 ,7  !  +-----+-------------+  !AO  !98/51 (74)  !  +-----+-------------+  !LV  !105/-3 ,7  !  +-----+-------------+  Valve Gradients and Areas  +------+----+----+----+-----+----+------+  !Valve !Peak!Mean!Area!Index!Flow!Source!  +------+----+----+----+-----+----+------+  !Aortic!7  !9  !  !   !  !   !  +------+----+----+----+-----+----+------+  !Aortic!7  !9  !  !   !  !   !  +------+----+----+----+-----+----+------+   Echo 08/27/2016 Findings  Mitral Valve  Structurally normal mitral valve, noprolapse.  No significant mitral regurgitation.  Aortic Valve  The aortic valve appears to be trileaflet.  No stenosis or regurgitation.  Tricuspid Valve  Tricuspid valve is structurally normal.  No significant tricuspid regurgitation .  Pulmonic Valve  The pulmonic valve was not well visualized  No Doppler evidence of pulmonic stenosis or insufficiency.  Left Atrium  Mild Left atrial enlargement.  Left Ventricle  Mild basal Inferior and anterior segmental LV Dysfunction.  Ejection fraction is visuallyestimated at 50-55%  Right Atrium  Normal right atrium.  Right Ventricle  Normal right ventricle structure and function.  Pericardial Effusion  No evidence of pericardial effusion.  Miscellaneous  The aorta is within normal limits.  The IVC is  normal  M-Mode/2D Measurements & Calculations  LV Diastolic Dimension: LV Systolic Dimension:  LA Dimension: 4.7 cmAO  5.32 cm         4.12 cm  Root Dimension: 2.8 cmLA  LV FS:22.56 %      LV Volume Diastolic: 563 Area: 14.9 cm2  LV PW Diastolic: 7.02 cm ml  Septum Diastolic: 6.37  LV Volume Systolic: 85.8  cm            ml              LV EDV/LV EDV Index: 151              ml/71 m2LV ESV/LV ESV  LA/Aorta: 1.68             Index: 69.9 ml/33 m2              EF Calculated: 53.71 %  LA volume/Index: 45.9 ml                           /68m2  Doppler Measurements & Calculations  MV Peak E-Wave: 98 cm/s    AV Peak Velocity: 140 cm/s  MV Peak A-Wave: 57.7 cm/s   AV Peak Gradient: 7.84 mmHg  MV E/A Ratio: 1.7  MV Peak Gradient: 3.84 mmHg  MV Deceleration Time: 144 msec  TDI E' Velocity: 13.4 cm/s  E/E' prime 7.3  [address]  [place], [place] [address]  [phone]   Laboratory Data:  High Sensitivity Troponin:   Recent Labs  Lab 06/07/20 0805 06/07/20 1000  TROPONINIHS 227* 404*     Chemistry Recent Labs  Lab 06/07/20 0805  NA 136  K 4.5  CL 106  CO2 23  GLUCOSE 265*  BUN 11  CREATININE 0.67  CALCIUM 9.0  GFRNONAA >60  GFRAA >60  ANIONGAP 7    Recent Labs  Lab 06/07/20 1000  PROT 6.5  ALBUMIN 3.8  AST 14*  ALT 17  ALKPHOS 47  BILITOT 0.3   Hematology Recent Labs  Lab 06/07/20 0805  WBC 9.1  RBC 5.83*  HGB 15.4*  HCT 49.9*  MCV 85.6  MCH 26.4  MCHC 30.9  RDW 15.7*  PLT 161   BNP Recent Labs  Lab 06/07/20 1000  BNP 173.4*    DDimer No results for input(s): DDIMER in the last 168 hours.   Radiology/Studies:  DG Chest 2 View  Result Date: 06/07/2020 CLINICAL DATA:  Chest pain. EXAM: CHEST - 2 VIEW COMPARISON:  06/10/2018. FINDINGS: Prior median sternotomy. Heart size stable. No pulmonary venous  congestion. No focal infiltrate. Stable mild chronic interstitial prominence. No pleural effusion or pneumothorax. No acute bony abnormality identified. Degenerative change thoracic spine. Stable lower thoracic vertebral body compression fracture. IMPRESSION: 1. Prior median sternotomy. Heart size stable. No pulmonary venous congestion. 2. No acute pulmonary infiltrate. Stable chronic interstitial changes. Electronically Signed   By: Marcello Moores  Register   On: 06/07/2020 08:53  [id number]} TIMI Risk Score for Unstable Angina or Non-ST Elevation MI:   The patient's TIMI risk score is 5, which indicates a 26% risk of all cause mortality, new or recurrent myocardial infarction or need for urgent revascularization in the next 14 days.    Assessment and Plan:   1. NSTEMI Presented with symptoms classic for angina for past couple of months in setting of noncompliance with medications and ongoing tobacco smoking.  Symptoms similar when [name] had CABG.  High-sensitivity troponin 227>>404.  EKG with lateral T wave inversion. -Aspirin 324 mg already given. -Continue Lipitor 80 mg -Add beta-blocker -Get echocardiogram - The patient understands that risks include but are not limited to stroke (1 in  1000), death (1 in 14), kidney failure [usually temporary] (1 in 500), bleeding (1 in 200), allergic reaction [possibly serious] (1 in 200), and agrees to proceed.   2.  Hypertension -Add beta-blocker  3.  Hyperlipidemia -Get lipid panel -Continue Lipitor 80 mg daily  4.  Diabetes mellitus -Per primary team  5.  Ongoing tobacco smoking -Recommended cessation  I have at length discussion regarding importance of compliance and smoking cessation.   For questions or updates, please contact [place] Please consult [email or web address] for contact info under    Jarrett Soho, PA  06/07/2020 12:52 PM   Personally seen and examined. Agree with above.   44 year old female with prior postpartum  cardiomyopathy, and subsequent discovery of ischemic cardiomyopathy underwent CABG in 2017 LIMA to LAD and SVG to PDA at [facility] with EF of 40 to 45% most recently lost to follow-up, heavy smoker here with chest discomfort concerning for unstable angina/non-STEMI.  GEN: Well nourished, well developed, in no acute distress  HEENT: normal  Neck: no JVD, carotid bruits, or masses Cardiac: RRR; no murmurs, rubs, or gallops,no edema CABG scar noted Respiratory:  clear to auscultation bilaterally, normal work of breathing GI: soft, nontender, nondistended, + BS MS: no deformity or atrophy  Skin: warm and dry, no rash Neuro:  Alert and Oriented x 3, Strength and sensation are intact Psych: euthymic mood, full affect  Troponin value was 404 EKG personally reviewed shows lateral ST segment depression change from prior  Assessment and plan:  Non-ST elevation myocardial infarction -We will go ahead and proceed with cardiac catheterization with graft analysis and possible percutaneous intervention.  Risks and benefits of been explained including stroke heart attack death renal impairment bleeding.  [name] is willing to proceed.  Family member in room. -Beta-blocker high intensity statin  Ischemic cardiomyopathy -Eventually may be a candidate for Entresto post catheterization.  Beta-blocker has been started.  EF previously 40 to 45%.  Repeating echocardiogram.  Tobacco use -Continue counsel on cessation.  Diabetes with hypertension and hyperlipidemia -Per primary team.  Adding beta-blocker, Lipitor.    Candee Furbish, MD

## 2020-06-07 NOTE — H&P (View-Only) (Signed)
Cardiology Consultation:   Patient ID: [name] MRN: [id number]; DOB: Nov 07, 1976  Admit date: 06/07/2020 Date of Consult: 06/07/2020  Primary Care Provider: System, Pcp Not In [place] Cardiologist: Dr. Bettina Gavia (Last seen 2018)   Patient Profile:   [name] is a 44 y.o. female with a hx of CAD s/p CABG September 2017, ischemic cardiomyopathy, diabetes mellitus, hypertension, ongoing tobacco smoking and hyperlipidemia who is being seen today for the evaluation of non-STEMI at the request of Dr. Heber Fleming.  History of postpartum cardiomyopathy in 2012.  She was followed by Dr. Einar Gip and lost follow-up.  Patient established care in [facility] in 2017 for chest pain.  She had a abnormal stress test and eventually underwent CABG x2 (LIMA to LAD and SVG to PDA) September 2017 at [facility].  Last echocardiogram March 2018 showed LV function of 40 to 45%.  Last seen by cardiologist 05/2017.  She lost insurance and cardiolog follow-up.  She is not taking her cardiac medication for greater than 2 years.  Recently established care with PCP and only started on diabetic medications.  History of Present Illness:   [name] has intermittent substernal chest pressure for past couple of months.  Initially it was exertional and relieved with rest.  Last night at 7 PM she had a severe substernal squeezing pressure radiating to her shoulder blade.  It was on and off all night.  She went to work this morning however had recurrent symptoms and brought to ER for further evaluation.  BNP 173.  High-sensitivity troponin 227>>404.  Creatinine normal.  Covid negative.  Chest x-ray with chronic interstitial changes.  Patient reports noncompliance with medication.  She smokes 1 pack of cigarette every day.  Denies illicit drug use or alcohol abuse.  Chest pain improved on IV heparin and nitroglycerin drip.   Past Medical History:  Diagnosis Date  . Abnormal Pap smear   . Anemia   .  Cardiomyopathy ([place])    "postpartem cardiomyopathy"- 2012- has since been released by cardiology  . Depression   . Diabetes mellitus   . Hypertension   . Liver abscess    2012- Terrial Rhodes drained placed for awhile.  . Migraine    past x1 -none since.  . Tobacco abuse     Past Surgical History:  Procedure Laterality Date  . APPENDECTOMY    . CORONARY ARTERY BYPASS GRAFT  2017   [place]     90's- cervix  . DILATION AND CURETTAGE OF UTERUS  1994  . LAPAROSCOPIC APPENDECTOMY N/A 01/28/2016   Procedure: APPENDECTOMY LAPAROSCOPIC;  Surgeon: Stark Klein, MD;  Location: [place];  Service: General;  Laterality: N/A;  . ROBOTIC ASSISTED SALPINGO OOPHERECTOMY Right 03/24/2016   Procedure: XI ROBOTIC ASSISTED LAPAROSCOPIC RIGHT SALPINGO OOPHORECTOMY;  Surgeon: Everitt Amber, MD;  Location: [facility];  Service: Gynecology;  Laterality: Right;  . TONSILLECTOMY        Inpatient Medications: Scheduled Meds: . atorvastatin  80 mg Oral Daily  . busPIRone  10 mg Oral BID  . [START ON 06/08/2020] gabapentin  300 mg Oral Daily  . insulin aspart  0-15 Units Subcutaneous Q4H  . insulin glargine  10 Units Subcutaneous QHS  . sodium chloride flush  3 mL Intravenous Once  . sodium chloride flush  3 mL Intravenous Q12H   Continuous Infusions: . heparin 1,000 Units/hr (06/07/20 1035)  . nitroGLYCERIN 5 mcg/min (06/07/20 1027)   PRN Meds:   Allergies:  No Known Allergies  Social History:   Social History   Socioeconomic History  . Marital status: Single    Spouse name: Not on file  . Number of children: Not on file  . Years of education: Not on file  . Highest education level: Not on file  Occupational History  . Not on file  Tobacco Use  . Smoking status: Current Every Day Smoker    Packs/day: 0.50    Years: 21.00    Pack years: 10.50    Types: Cigarettes  . Smokeless tobacco: Never Used  Substance and Sexual Activity  . Alcohol use: No    Comment:  rare social  . Drug use: No    Comment: x1 episode "Meth" use.  Marland Kitchen Sexual activity: Not Currently    Birth control/protection: None  Other Topics Concern  . Not on file  Social History Narrative  . Not on file   Social Determinants of Health   Financial Resource Strain:   . Difficulty of Paying Living Expenses:   Food Insecurity:   . Worried About Charity fundraiser in the Last Year:   . Arboriculturist in the Last Year:   Transportation Needs:   . Film/video editor (Medical):   Marland Kitchen Lack of Transportation (Non-Medical):   Physical Activity:   . Days of Exercise per Week:   . Minutes of Exercise per Session:   Stress:   . Feeling of Stress :   Social Connections:   . Frequency of Communication with Friends and Family:   . Frequency of Social Gatherings with Friends and Family:   . Attends Religious Services:   . Active Member of Clubs or Organizations:   . Attends Archivist Meetings:   Marland Kitchen Marital Status:   Intimate Partner Violence:   . Fear of Current or Ex-Partner:   . Emotionally Abused:   Marland Kitchen Physically Abused:   . Sexually Abused:     Family History:   Family History  Problem Relation Age of Onset  . Diabetes Maternal Grandmother   . Diabetes Paternal Grandfather      ROS:  Please see the history of present illness.  All other ROS reviewed and negative.     Physical Exam/Data:   Vitals:   06/07/20 0803 06/07/20 0900 06/07/20 1200  BP: (!) 163/78  (!) 151/77  Pulse: 86  83  Resp: 18  (!) 23  Temp: 98.5 F (36.9 C)    TempSrc: Oral    SpO2: 98%  97%  Weight:  97.1 kg   Height:  5\' 7"  (1.702 m)    No intake or output data in the 24 hours ending 06/07/20 1252 Last 3 Weights 06/07/2020 06/10/2018 05/10/2016  Weight (lbs) 214 lb 222 lb 230 lb  Weight (kg) 97.07 kg 100.699 kg 104.327 kg     Body mass index is 33.52 kg/m.  General:  Well nourished, well developed, in no acute distress HEENT: normal Lymph: no adenopathy Neck: no  JVD Endocrine:  No thryomegaly Vascular: No carotid bruits; FA pulses 2+ bilaterally without bruits  Cardiac:  normal S1, S2; RRR; no murmur  Lungs:  clear to auscultation bilaterally, no wheezing, rhonchi or rales  Abd: soft, nontender, no hepatomegaly  Ext: Trace edema Musculoskeletal:  No deformities, BUE and BLE strength normal and equal Skin: warm and dry  Neuro:  CNs 2-12 intact, no focal abnormalities noted Psych:  Normal affect   EKG:  The EKG was personally reviewed and  demonstrates: Sinus rhythm at rate of 97 bpm, T wave inversion in lateral leads Telemetry:  Telemetry was personally reviewed and demonstrates:  SR with PVCs  Relevant CV Studies:  Cath 08/2016 Diagnostic Procedure Summary  1. CTO prox LAD with collateral.  2. Severe, diffuse Distal RCS lesion  3. Inferior wall hypokinesis, LVEF 40%  Diagnostic Procedure Recommendations  CTS consult for CABG  I have reviewed the recent history and physical documentation. I personally  spent 23 minutes continuously monitoring the patient during the administration  of moderate sedation. Pre and post activities have been reviewed. I was  present for the entire procedure.  Signatures  Electronically signed by Rozann Lesches, MD(Diagnostic Physician) on  08/27/2016 10:24  Angiographic findings  Cardiac Arteries and Lesion Findings  LMCA: 0%.  LAD:  Lesion on Prox LAD: 100% stenosis.  LCx:  Lesion on Prox CX: 20% stenosis.  RCA:  Lesion on Dist RCA: Distal subsection.95% stenosis. Pre procedure TIMI III  flow was noted. The lesion was diffuse.  Procedure Data  Procedure Date  Date: 08/27/2016 Start: 09:47  Entry Locations  - Percutaneous access was performed through the Right Radial artery. A 5 Fr   sheath was inserted.  Diagnostic Catheters  - 21F JR 4.0 IMPULSE Diagnostic Catheter  - 21F PIG IMPULSE Diagnostic Catheter  Fluoroscopy Time: Diagnostic: 5:01 minutes. Total: 5:01 minutes.  Admission  Data  Admission Date: 08/27/2016  Coronary Tree  Dominance: Right  VA  LV function assessed AJ:OINOMVEH.  Ejection Fraction  - Method: LV gram. EF%: 45.  LVA Segment Contractility  1 - Normal    3 - Mild    5 - Severe   7 - Dyskinesis  hypokinesis   hypokinesis  2 - Hypokinesis 4 - Moderate  6 - Akinesis  8 - Aneurysm  hypokinesis  Hemodynamics  Condition: Rest  Heart Rate: 90 bpm  Pressures  +-----+-------------+  !Site !Pressure   !  +-----+-------------+  !AO  !206/136 (142)!  +-----+-------------+  !LV  !103/0 ,7   !  +-----+-------------+  !LV  !101/-1 ,7  !  +-----+-------------+  !AO  !98/51 (74)  !  +-----+-------------+  !LV  !105/-3 ,7  !  +-----+-------------+  Valve Gradients and Areas  +------+----+----+----+-----+----+------+  !Valve !Peak!Mean!Area!Index!Flow!Source!  +------+----+----+----+-----+----+------+  !Aortic!7  !9  !  !   !  !   !  +------+----+----+----+-----+----+------+  !Aortic!7  !9  !  !   !  !   !  +------+----+----+----+-----+----+------+   Echo 08/27/2016 Findings  Mitral Valve  Structurally normal mitral valve, noprolapse.  No significant mitral regurgitation.  Aortic Valve  The aortic valve appears to be trileaflet.  No stenosis or regurgitation.  Tricuspid Valve  Tricuspid valve is structurally normal.  No significant tricuspid regurgitation .  Pulmonic Valve  The pulmonic valve was not well visualized  No Doppler evidence of pulmonic stenosis or insufficiency.  Left Atrium  Mild Left atrial enlargement.  Left Ventricle  Mild basal Inferior and anterior segmental LV Dysfunction.  Ejection fraction is visuallyestimated at 50-55%  Right Atrium  Normal right atrium.  Right Ventricle  Normal right ventricle structure and function.  Pericardial Effusion  No evidence of pericardial effusion.  Miscellaneous  The aorta is within normal limits.  The IVC is  normal  M-Mode/2D Measurements & Calculations  LV Diastolic Dimension: LV Systolic Dimension:  LA Dimension: 4.7 cmAO  5.32 cm         4.12 cm  Root Dimension: 2.8 cmLA  LV FS:22.56 %      LV Volume Diastolic: 299 Area: 37.1 cm2  LV PW Diastolic: 6.96 cm ml  Septum Diastolic: 7.89  LV Volume Systolic: 38.1  cm            ml              LV EDV/LV EDV Index: 151              ml/71 m2LV ESV/LV ESV  LA/Aorta: 1.68             Index: 69.9 ml/33 m2              EF Calculated: 53.71 %  LA volume/Index: 45.9 ml                           /37m2  Doppler Measurements & Calculations  MV Peak E-Wave: 98 cm/s    AV Peak Velocity: 140 cm/s  MV Peak A-Wave: 57.7 cm/s   AV Peak Gradient: 7.84 mmHg  MV E/A Ratio: 1.7  MV Peak Gradient: 3.84 mmHg  MV Deceleration Time: 144 msec  TDI E' Velocity: 13.4 cm/s  E/E' prime 7.3  [address]  [place], [place] [address]  [phone]   Laboratory Data:  High Sensitivity Troponin:   Recent Labs  Lab 06/07/20 0805 06/07/20 1000  TROPONINIHS 227* 404*     Chemistry Recent Labs  Lab 06/07/20 0805  NA 136  K 4.5  CL 106  CO2 23  GLUCOSE 265*  BUN 11  CREATININE 0.67  CALCIUM 9.0  GFRNONAA >60  GFRAA >60  ANIONGAP 7    Recent Labs  Lab 06/07/20 1000  PROT 6.5  ALBUMIN 3.8  AST 14*  ALT 17  ALKPHOS 47  BILITOT 0.3   Hematology Recent Labs  Lab 06/07/20 0805  WBC 9.1  RBC 5.83*  HGB 15.4*  HCT 49.9*  MCV 85.6  MCH 26.4  MCHC 30.9  RDW 15.7*  PLT 161   BNP Recent Labs  Lab 06/07/20 1000  BNP 173.4*    DDimer No results for input(s): DDIMER in the last 168 hours.   Radiology/Studies:  DG Chest 2 View  Result Date: 06/07/2020 CLINICAL DATA:  Chest pain. EXAM: CHEST - 2 VIEW COMPARISON:  06/10/2018. FINDINGS: Prior median sternotomy. Heart size stable. No pulmonary venous  congestion. No focal infiltrate. Stable mild chronic interstitial prominence. No pleural effusion or pneumothorax. No acute bony abnormality identified. Degenerative change thoracic spine. Stable lower thoracic vertebral body compression fracture. IMPRESSION: 1. Prior median sternotomy. Heart size stable. No pulmonary venous congestion. 2. No acute pulmonary infiltrate. Stable chronic interstitial changes. Electronically Signed   By: Marcello Moores  Register   On: 06/07/2020 08:53  [id number]} TIMI Risk Score for Unstable Angina or Non-ST Elevation MI:   The patient's TIMI risk score is 5, which indicates a 26% risk of all cause mortality, new or recurrent myocardial infarction or need for urgent revascularization in the next 14 days.    Assessment and Plan:   1. NSTEMI Presented with symptoms classic for angina for past couple of months in setting of noncompliance with medications and ongoing tobacco smoking.  Symptoms similar when she had CABG.  High-sensitivity troponin 227>>404.  EKG with lateral T wave inversion. -Aspirin 324 mg already given. -Continue Lipitor 80 mg -Add beta-blocker -Get echocardiogram - The patient understands that risks include but are not limited to stroke (1 in  1000), death (1 in 54), kidney failure [usually temporary] (1 in 500), bleeding (1 in 200), allergic reaction [possibly serious] (1 in 200), and agrees to proceed.   2.  Hypertension -Add beta-blocker  3.  Hyperlipidemia -Get lipid panel -Continue Lipitor 80 mg daily  4.  Diabetes mellitus -Per primary team  5.  Ongoing tobacco smoking -Recommended cessation  I have at length discussion regarding importance of compliance and smoking cessation.   For questions or updates, please contact [place] Please consult [email or web address] for contact info under    Jarrett Soho, PA  06/07/2020 12:52 PM   Personally seen and examined. Agree with above.   44 year old female with prior postpartum  cardiomyopathy, and subsequent discovery of ischemic cardiomyopathy underwent CABG in 2017 LIMA to LAD and SVG to PDA at [facility] with EF of 40 to 45% most recently lost to follow-up, heavy smoker here with chest discomfort concerning for unstable angina/non-STEMI.  GEN: Well nourished, well developed, in no acute distress  HEENT: normal  Neck: no JVD, carotid bruits, or masses Cardiac: RRR; no murmurs, rubs, or gallops,no edema CABG scar noted Respiratory:  clear to auscultation bilaterally, normal work of breathing GI: soft, nontender, nondistended, + BS MS: no deformity or atrophy  Skin: warm and dry, no rash Neuro:  Alert and Oriented x 3, Strength and sensation are intact Psych: euthymic mood, full affect  Troponin value was 404 EKG personally reviewed shows lateral ST segment depression change from prior  Assessment and plan:  Non-ST elevation myocardial infarction -We will go ahead and proceed with cardiac catheterization with graft analysis and possible percutaneous intervention.  Risks and benefits of been explained including stroke heart attack death renal impairment bleeding.  She is willing to proceed.  Family member in room. -Beta-blocker high intensity statin  Ischemic cardiomyopathy -Eventually may be a candidate for Entresto post catheterization.  Beta-blocker has been started.  EF previously 40 to 45%.  Repeating echocardiogram.  Tobacco use -Continue counsel on cessation.  Diabetes with hypertension and hyperlipidemia -Per primary team.  Adding beta-blocker, Lipitor.    Candee Furbish, MD

## 2020-06-07 NOTE — Interval H&P Note (Signed)
History and Physical Interval Note:  06/07/2020 4:43 PM  [name]  has presented today for surgery, with the diagnosis of nonstemi.  The various methods of treatment have been discussed with the patient and family. After consideration of risks, benefits and other options for treatment, the patient has consented to  Procedure(s): LEFT HEART CATH AND CORS/GRAFTS ANGIOGRAPHY (N/A) as a surgical intervention.  The patient's history has been reviewed, patient examined, no change in status, stable for surgery.  I have reviewed the patient's chart and labs.  Questions were answered to the patient's satisfaction.   Cath Lab Visit (complete for each Cath Lab visit)  Clinical Evaluation Leading to the Procedure:   ACS: Yes.    Non-ACS:    Anginal Classification: CCS IV  Anti-ischemic medical therapy: No Therapy  Non-Invasive Test Results: No non-invasive testing performed  Prior CABG: Previous CABG        Collier Salina Litzenberg [facility] 06/07/2020 4:43 PM

## 2020-06-07 NOTE — ED Triage Notes (Signed)
Patient c/o tachycardia and chest pain since last night. Endorsed hx of double bypass in 2017. Pt endorsed has not taken any medications and has not had a follow up with cardiologist recently.

## 2020-06-07 NOTE — ED Provider Notes (Addendum)
[facility] EMERGENCY DEPARTMENT Provider Note   CSN: [id number] Arrival date & time: 06/07/20  7628     History Chief Complaint  Patient presents with  . Chest Pain    [name] is a 44 y.o. female.  HPI Patient reports she started getting chest discomfort around 7 PM yesterday evening.  At first she thought maybe her bra was too tight.  She loosened it with no relief.  She continued to have a sensation of tightness throughout her chest and sensation of difficulty to get a good breath.  She also had associated discomfort in her left arm.  It felt somewhat heavy or numb.  Symptoms improved enough for her to go to sleep.  She reports she slept through the night and then got up in the morning at about 5 AM.  She felt better when she awakened but shortly before getting to work at 6 AM she started feeling some sense of tightness in her chest again.  She tried to continue with her job for the first hour or so but the symptoms were worsening with her arm feeling uncomfortable or heavy and her chest feeling tight.  At that time she determined she needed to come to the emergency department.  She reports since being in the room the chest pain has resolved again.  She perceives some sensation of discomfort in her left arm.  No shortness of breath at this time.  Patient has history of CABG in 2017 done at [facility].  For about 2 years, she has been noncompliant with any medications due to loss of insurance.  She reports she has taken her blood pressure periodically and it has been normotensive off of medications.  She reports she took her blood pressure when she got her symptoms yesterday and pressures were up to 170s over 110s.  She did not take any aspirin or other medications for her symptoms.  Patient is a chronic smoker approximately 1 pack/day.  Rare social alcohol use.  Has had 2 Covid immunizations.    Past Medical History:  Diagnosis Date  . Abnormal Pap  smear   . Anemia   . Cardiomyopathy ([place])    "postpartem cardiomyopathy"- 2012- has since been released by cardiology  . Depression   . Diabetes mellitus   . Hypertension   . Liver abscess    2012- Terrial Rhodes drained placed for awhile.  . Migraine    past x1 -none since.  . Tobacco abuse     Patient Active Problem List   Diagnosis Date Noted  . Acute appendicitis 01/28/2016  . Acute appendicitis with localized peritonitis 01/28/2016  . Renal failure 09/25/2011  . SIRS due to infectious process with acute organ dysfunction ([place]) 09/18/2011  . Obesity 09/18/2011  . Diabetes mellitus type II 08/22/2011  . Liver abscess 08/20/2011  . 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 APPENDECTOMY    . CORONARY ARTERY BYPASS GRAFT  2017   [place]     90's- cervix  . DILATION AND CURETTAGE OF UTERUS  1994  . LAPAROSCOPIC APPENDECTOMY N/A 01/28/2016   Procedure: APPENDECTOMY LAPAROSCOPIC;  Surgeon: Stark Klein, MD;  Location: [place];  Service: General;  Laterality: N/A;  . ROBOTIC ASSISTED SALPINGO OOPHERECTOMY Right 03/24/2016   Procedure: XI ROBOTIC ASSISTED LAPAROSCOPIC RIGHT SALPINGO OOPHORECTOMY;  Surgeon: Everitt Amber, MD;  Location: [facility];  Service: Gynecology;  Laterality: Right;  . TONSILLECTOMY       OB History    Gravida  5   Para  4   Term  3   Preterm  1   AB  1   Living  4     SAB  1   TAB      Ectopic      Multiple      Live Births  1           Family History  Problem Relation Age of Onset  . Diabetes Maternal Grandmother   . Diabetes Paternal Grandfather     Social History   Tobacco Use  . Smoking status: Current Every Day Smoker    Packs/day: 0.50    Years: 21.00    Pack years: 10.50    Types: Cigarettes  . Smokeless tobacco: Never Used  Substance Use Topics  . Alcohol use: No    Comment: rare social  . Drug use: No    Comment: x1 episode  "Meth" use.    Home Medications Prior to Admission medications   Medication Sig Start Date End Date Taking? Authorizing Provider  busPIRone (BUSPAR) 10 MG tablet Take 10 mg by mouth 2 (two) times daily. 04/04/20  Yes [provider]  gabapentin (NEURONTIN) 300 MG capsule Take 300 mg by mouth daily. 04/04/20  Yes [provider]  insulin degludec (TRESIBA FLEXTOUCH) 100 UNIT/ML FlexTouch Pen Inject 18 Units into the skin at bedtime.   Yes [provider]  insulin lispro (HUMALOG) 100 UNIT/ML injection Inject 2-10 Units into the skin 3 (three) times daily before meals. Per sliding scale: CBG 150-200 = 2 units,  201-250= 4 units, 251-300= 6 units, 301-350= 8 units, 351-400 = 10 units, above 400 call MD   Yes [provider]  carvedilol (COREG) 12.5 MG tablet Take 1 tablet (12.5 mg total) by mouth 2 (two) times daily with a meal. Patient not taking: Reported on 06/07/2020 06/10/18   Nat Christen, MD  lisinopril (PRINIVIL,ZESTRIL) 20 MG tablet Take 1 tablet (20 mg total) by mouth daily. Patient not taking: Reported on 06/07/2020 06/10/18   Nat Christen, MD  metFORMIN (GLUCOPHAGE) 1000 MG tablet Take 1 tablet (1,000 mg total) by mouth 2 (two) times daily. Patient not taking: Reported on 06/07/2020 06/10/18   Nat Christen, MD    Allergies    Patient has no known allergies.  Review of Systems   Review of Systems  Physical Exam Updated Vital Signs BP (!) 163/78 (BP Location: Left Arm)   Pulse 86   Temp 98.5 F (36.9 C) (Oral)   Resp 18   Ht 5\' 7"  (1.702 m)   Wt 97.1 kg   SpO2 98%   BMI 33.52 kg/m   Physical Exam  ED Results / Procedures / Treatments   Labs (all labs ordered are listed, but only abnormal results are displayed) Labs Reviewed  BASIC METABOLIC PANEL - Abnormal; Notable for the following components:      Result Value   Glucose, Bld 265 (*)    All other components within normal limits  CBC - Abnormal; Notable for the following components:    RBC 5.83 (*)    Hemoglobin 15.4 (*)    HCT 49.9 (*)    RDW 15.7 (*)    All other components within normal limits  TROPONIN I (HIGH SENSITIVITY) - Abnormal; Notable for the following components:   Troponin I (High Sensitivity) 227 (*)    All other components  within normal limits  SARS CORONAVIRUS 2 BY RT PCR (HOSPITAL ORDER, [facility])  HEPATIC FUNCTION PANEL  LIPASE, BLOOD  BRAIN NATRIURETIC PEPTIDE  PROTIME-INR  HEPARIN LEVEL (UNFRACTIONATED)  I-STAT BETA HCG BLOOD, ED ([facility], [facility], [facility] ONLY)  POC SARS CORONAVIRUS 2 AG -  ED  TROPONIN I (HIGH SENSITIVITY)    EKG EKG Interpretation  Date/Time:  Friday June 07 2020 07:49:23 EDT Ventricular Rate:  97 PR Interval:  156 QRS Duration: 80 QT Interval:  328 QTC Calculation: 416 R Axis:   77 Text Interpretation: Normal sinus rhythm Septal infarct , age undetermined ST & T wave abnormality, consider inferolateral ischemia Abnormal ECG subtle inferior ST depression as compared to previous tracing. Confirmed by Charlesetta Shanks [id number]) on 06/07/2020 9:37:34 AM   Radiology DG Chest 2 View  Result Date: 06/07/2020 CLINICAL DATA:  Chest pain. EXAM: CHEST - 2 VIEW COMPARISON:  06/10/2018. FINDINGS: Prior median sternotomy. Heart size stable. No pulmonary venous congestion. No focal infiltrate. Stable mild chronic interstitial prominence. No pleural effusion or pneumothorax. No acute bony abnormality identified. Degenerative change thoracic spine. Stable lower thoracic vertebral body compression fracture. IMPRESSION: 1. Prior median sternotomy. Heart size stable. No pulmonary venous congestion. 2. No acute pulmonary infiltrate. Stable chronic interstitial changes. Electronically Signed   By: Marcello Moores  Register   On: 06/07/2020 08:53    Procedures Procedures (including critical care time) CRITICAL CARE Performed by: Charlesetta Shanks   Total critical care time: 30  minutes  Critical care time was exclusive of separately  billable procedures and treating other patients.  Critical care was necessary to treat or prevent imminent or life-threatening deterioration.  Critical care was time spent personally by me on the following activities: development of treatment plan with patient and/or surrogate as well as nursing, discussions with consultants, evaluation of patient's response to treatment, examination of patient, obtaining history from patient or surrogate, ordering and performing treatments and interventions, ordering and review of laboratory studies, ordering and review of radiographic studies, pulse oximetry and re-evaluation of patient's condition. Medications Ordered in ED Medications  sodium chloride flush (NS) 0.9 % injection 3 mL (has no administration in time range)  nitroGLYCERIN 50 mg in dextrose 5 % 250 mL (0.2 mg/mL) infusion (5 mcg/min Intravenous New Bag/Given 06/07/20 1027)  heparin ADULT infusion 100 units/mL (25000 units/279mL sodium chloride 0.45%) (1,000 Units/hr Intravenous New Bag/Given 06/07/20 1035)  aspirin chewable tablet 324 mg (324 mg Oral Given 06/07/20 1019)  acetaminophen (TYLENOL) tablet 325 mg (325 mg Oral Given 06/07/20 1020)  heparin bolus via infusion 4,000 Units (4,000 Units Intravenous Bolus from Bag 06/07/20 1037)    ED Course  I have reviewed the triage vital signs and the nursing notes.  Pertinent labs & imaging results that were available during my care of the patient were reviewed by me and considered in my medical decision making (see chart for details).  Clinical Course as of Jun 08 1039  Fri Jun 07, 2020  1009 Consult: [facility] cardiology [facility] reviewed for NSTEMI presentation.  Will be seen ASAP.  Request hospitalist admission.   [MP]  0272 Consult: Internal medicine teaching service for admission   [MP]    Clinical Course User Index [MP] Charlesetta Shanks, MD   MDM Rules/Calculators/A&P                          Patient presents with symptoms onset of chest pain  greater than 12 hours ago.  Patient has known history of coronary artery disease.  Troponin is elevated.  Subtle ST depression present on EKG.  Findings consistent with NSTEMI.  Patient does not currently have active chest pain.  Will initiate heparin, aspirin and nitroglycerin drip.  Blood pressures ranging 825P systolic in the emergency department.  Due to financial hardship patient has been noncompliant with any outpatient medications.  Patient has had 2 Covid vaccinations.  No symptoms suggestive of infectious process. Final Clinical Impression(s) / ED Diagnoses Final diagnoses:  NSTEMI (non-ST elevated myocardial infarction) [facility])    Rx / [place] Orders ED Discharge Orders    None       Charlesetta Shanks, MD 06/07/20 1019    Charlesetta Shanks, MD 06/07/20 1041

## 2020-06-07 NOTE — Progress Notes (Signed)
ANTICOAGULATION CONSULT NOTE - Initial Consult  Pharmacy Consult for Heparin Indication: chest pain/ACS  No Known Allergies  Patient Measurements:   Heparin Dosing Weight: 83 kg  Vital Signs: Temp: 98.5 F (36.9 C) (06/18 0803) Temp Source: Oral (06/18 0803) BP: 163/78 (06/18 0803) Pulse Rate: 86 (06/18 0803)  Labs: Recent Labs    06/07/20 0805  HGB 15.4*  HCT 49.9*  PLT 161  CREATININE 0.67  TROPONINIHS 227*    CrCl cannot be calculated (Unknown ideal weight.).   Medical History: Past Medical History:  Diagnosis Date  . Abnormal Pap smear   . Anemia   . Cardiomyopathy ([place])    "postpartem cardiomyopathy"- 2012- has since been released by cardiology  . Depression   . Diabetes mellitus   . Hypertension   . Liver abscess    2012- Terrial Rhodes drained placed for awhile.  . Migraine    past x1 -none since.  . Tobacco abuse     Assessment: 44 yo female presented on 06/07/2020 with tachycardia and chest pain since last night. Troponin 227. Pharmacy consulted to dose heparin for ACS. Patient confirmed she is not on any anticoagulants prior to admission.   Goal of Therapy:  Heparin level 0.3-0.7 units/ml Monitor platelets by anticoagulation protocol: Yes   Plan:  Heparin 4000 units bolus  Start heparin at 1000 units/hr  Check heparin level 1630 Monitor heparin level, CBC and S/S of bleeding daily   Cristela Felt, PharmD PGY1 Pharmacy Resident [employer]: [phone]  06/07/2020,9:49 AM

## 2020-06-07 NOTE — ED Notes (Signed)
Hildred Alamin (daughter) [phone] ; please call for update

## 2020-06-08 ENCOUNTER — Observation Stay (HOSPITAL_BASED_OUTPATIENT_CLINIC_OR_DEPARTMENT_OTHER): Payer: 59

## 2020-06-08 ENCOUNTER — Other Ambulatory Visit (HOSPITAL_COMMUNITY): Payer: 59

## 2020-06-08 DIAGNOSIS — I340 Nonrheumatic mitral (valve) insufficiency: Secondary | ICD-10-CM

## 2020-06-08 DIAGNOSIS — I214 Non-ST elevation (NSTEMI) myocardial infarction: Secondary | ICD-10-CM | POA: Diagnosis not present

## 2020-06-08 LAB — ECHOCARDIOGRAM COMPLETE
Height: 67 in
Weight: 3442.7 oz

## 2020-06-08 LAB — GLUCOSE, CAPILLARY
Glucose-Capillary: 156 mg/dL — ABNORMAL HIGH (ref 70–99)
Glucose-Capillary: 161 mg/dL — ABNORMAL HIGH (ref 70–99)
Glucose-Capillary: 142 mg/dL — ABNORMAL HIGH (ref 70–99)
Glucose-Capillary: 261 mg/dL — ABNORMAL HIGH (ref 70–99)
Glucose-Capillary: 177 mg/dL — ABNORMAL HIGH (ref 70–99)

## 2020-06-08 LAB — BASIC METABOLIC PANEL
Anion gap: 9 (ref 5–15)
BUN: 11 mg/dL (ref 6–20)
CO2: 21 mmol/L — ABNORMAL LOW (ref 22–32)
Calcium: 8.8 mg/dL — ABNORMAL LOW (ref 8.9–10.3)
Chloride: 109 mmol/L (ref 98–111)
Creatinine, Ser: 0.53 mg/dL (ref 0.44–1.00)
GFR calc Af Amer: >60 mL/min (ref >60)
GFR calc non Af Amer: >60 mL/min (ref >60)
Glucose, Bld: 160 mg/dL — ABNORMAL HIGH (ref 70–99)
Potassium: 4.2 mmol/L (ref 3.5–5.1)
Sodium: 139 mmol/L (ref 135–145)

## 2020-06-08 MED ORDER — NICOTINE 21 MG/24HR TD PT24
21.0000 mg | MEDICATED_PATCH | TRANSDERMAL | 0 refills | Status: AC
Start: 2020-06-08 — End: 2020-07-08

## 2020-06-08 MED ORDER — TRESIBA FLEXTOUCH 100 UNIT/ML ~~LOC~~ SOPN: 20.0000 [IU] | PEN_INJECTOR | Freq: Every day | SUBCUTANEOUS | 0 refills | Status: DC

## 2020-06-08 MED ORDER — ATORVASTATIN CALCIUM 80 MG PO TABS: 80.0000 mg | ORAL_TABLET | Freq: Every day | ORAL | 0 refills | Status: DC

## 2020-06-08 MED ORDER — INSULIN GLARGINE 100 UNIT/ML ~~LOC~~ SOLN
13.0000 [IU] | Freq: Every day | SUBCUTANEOUS | Status: DC
  Filled 2020-06-08: qty 0.13

## 2020-06-08 MED ORDER — LOSARTAN POTASSIUM 25 MG PO TABS: 25.0000 mg | ORAL_TABLET | Freq: Every day | ORAL | 0 refills | Status: DC

## 2020-06-08 MED ORDER — LOSARTAN POTASSIUM 25 MG PO TABS
25.0000 mg | ORAL_TABLET | Freq: Every day | ORAL | Status: DC
  Administered 2020-06-08: 25 mg via ORAL
  Filled 2020-06-08: qty 1

## 2020-06-08 MED ORDER — NICOTINE 21 MG/24HR TD PT24
21.0000 mg | MEDICATED_PATCH | Freq: Every day | TRANSDERMAL | Status: DC
  Administered 2020-06-08: 21 mg via TRANSDERMAL
  Filled 2020-06-08: qty 1

## 2020-06-08 MED ORDER — CARVEDILOL 12.5 MG PO TABS
12.5000 mg | ORAL_TABLET | Freq: Two times a day (BID) | ORAL | 1 refills | Status: DC
Start: 2020-06-08 — End: 2020-08-13

## 2020-06-08 MED ORDER — CARVEDILOL 12.5 MG PO TABS: 12.5000 mg | ORAL_TABLET | Freq: Two times a day (BID) | ORAL | Status: DC

## 2020-06-08 NOTE — Progress Notes (Signed)
  Echocardiogram 2D Echocardiogram has been performed.  Jannett Celestine 06/08/2020, 10:06 AM

## 2020-06-08 NOTE — Progress Notes (Signed)
   Subjective:   She is feeling well today and wants to discuss cath findings and future risk factors. Discussed that she is at increased risk compared to prior blockages as she is dependent on the collateral blood flow to her heart. Discussed the importance of smoking cessation which she understands. She is determined to stop smoking and would like to try a nicotine patch. She denies chest pain, SOB, pain at cath site.   She was recently started back on her insulin medications around three months ago. At that time she was told her a1c was >12. When she would check her glucose then it would be around 350. Now it is around 180-250 at home.   Objective:  Vital signs in last 24 hours: Vitals:   06/07/20 2018 06/08/20 0032 06/08/20 0214 06/08/20 0420  BP: 136/63 134/73  (!) 148/78  Pulse: 77 69  71  Resp: 18 17  17   Temp: 98.3 F (36.8 C) 98 F (36.7 C)  98.3 F (36.8 C)  TempSrc: Oral Oral  Oral  SpO2: 95% 96%  94%  Weight:   97.6 kg   Height:       Constitution: NAD, appears stated age Cardio: RRR, no m/r/g, no LE edema  Respiratory: CTA, no w/r/r MSK: moving all extremities Neuro: tearful, a&ox3 Skin: c/d/i   Cath 06/07/2020  Ost LAD to Prox LAD lesion is 100% stenosed.  2nd Mrg lesion is 70% stenosed.  Prox RCA to Mid RCA lesion is 100% stenosed.  SVG graft was visualized by angiography.  Origin to Prox Graft lesion is 100% stenosed.  LIMA graft was visualized by angiography and is large.  The graft exhibits no disease.  LV end diastolic pressure is mildly elevated.   1. 3 vessel obstructive CAD    - 100% proximal LAD    - 70% mid to distal OM2    - 100% proximal RCA 2. Occluded SVG to RCA 3. Patent LIMA to LAD 4. Mildly elevated LVEDP  Plan: optimal medical therapy and risk factor modification. No targets for PCI. RCA does have some collaterals.   Assessment/Plan:  Active Problems:   NSTEMI (non-ST elevated myocardial infarction) [facility])  44yo female  with PMH of CAD (s/p CABG 2017), HTN, and TIIDM who presented with chest pain and was found to have NSTEMI.   NSTEMI HTN Cath yesterday with three vessel obstruction, 100% LAD & proximal RCA, 70% mid to distal OM2 and occluded SVG to RCA. Collateral were present on RCA and patent graft with good supply to LAD. Cardiology recommends maximal medical management. Will follow-up echo. If stable, no further work-up and just need to assure she will have proper medications at discharge.   -TOC consult for assistance with medications - f/u echo - cont. Coreg 6.25 mg bid - cont. lipitor 80 mg qd - f/u cardiology recommendations, possibly will start entreso vs. ACEi  TIIDM - cont. SSI moderate  - increase lantus to 13U  - TOC consult for possible SGLT-2 inhibitor, otherwise follow-up with PCP   Tobacco Cessation Very interested in quitting. Discussed potentially starting chantix in the future as well if nicotine patch does not help.   - nicotine patch.   VTE: lovenox IVF: none  Diet: CM Code: full   Dispo: Anticipated discharge pending echo.   Drayce Tawil A, DO 06/08/2020, 7:07 AM Pager: [phone] After 5pm on weekdays and 1pm on weekends: On Call Pager: [phone]

## 2020-06-08 NOTE — Discharge Summary (Signed)
Name: [name] MRN: [id number] DOB: 1976-11-30 44 y.o. PCP: System, Pcp Not In  Date of Admission: 06/07/2020  7:54 AM Date of Discharge: 6/19/20216/19/2021 Attending Physician: Joni Reining, MD  Discharge Diagnosis: 1. NSTEMI 2. TIIDM  3. HTN 4. Tobacco Cessation 5. Combined systolic and diastolic HF  Discharge Medications: Allergies as of 06/08/2020   No Known Allergies     Medication List    STOP taking these medications   lisinopril 20 MG tablet Commonly known as: ZESTRIL     TAKE these medications   atorvastatin 80 MG tablet Commonly known as: LIPITOR Take 1 tablet (80 mg total) by mouth daily.   busPIRone 10 MG tablet Commonly known as: BUSPAR Take 10 mg by mouth 2 (two) times daily.   carvedilol 12.5 MG tablet Commonly known as: Coreg Take 1 tablet (12.5 mg total) by mouth 2 (two) times daily with a meal.   gabapentin 300 MG capsule Commonly known as: NEURONTIN Take 300 mg by mouth daily.   insulin lispro 100 UNIT/ML injection Commonly known as: HUMALOG Inject 2-10 Units into the skin 3 (three) times daily before meals. Per sliding scale: CBG 150-200 = 2 units,  201-250= 4 units, 251-300= 6 units, 301-350= 8 units, 351-400 = 10 units, above 400 call MD   losartan 25 MG tablet Commonly known as: COZAAR Take 1 tablet (25 mg total) by mouth daily.   metFORMIN 1000 MG tablet Commonly known as: GLUCOPHAGE Take 1 tablet (1,000 mg total) by mouth 2 (two) times daily.   nicotine 21 mg/24hr patch Commonly known as: NICODERM CQ - dosed in mg/24 hours Place 1 patch (21 mg total) onto the skin daily.   Tyler Aas FlexTouch 100 UNIT/ML FlexTouch Pen Generic drug: insulin degludec Inject 0.2 mLs (20 Units total) into the skin at bedtime. What changed: how much to take       Disposition and follow-up:   Ms.Karalyn Coggeshall was discharged from [facility] in Stable condition.  At the hospital follow up visit please address:  1. NSTEMI:  underwent cath, found to have stenosis of RCA stent. Medical management recommended: LDL <70, BP <130/80, aggressive diabetes control. lipitor 80 mg qd. Coreg 12.5mg  bid.  2. TIIDM: increase lantus to 20U, consider SGLT-2 inhibitor with history of CAD 3. HTN: started on losartan 25 mg qd. Goal <130/80.  4. Tobacco Cessation: counseled on smoking cessation, Rx for nicotine patches. Consider chantix.  5. Combined systolic and diastolic HF: EF 98-33% similar to prior echo 2017. Restarted coreg 12.5 mg bid.   2.  Labs / imaging needed at time of follow-up: none  3.  Pending labs/ test needing follow-up: none  Follow-up Appointments:  Follow-up Information    Jerline Pain, MD Follow up.   Specialty: Cardiology Why: The office should call you within 2-3 business days to arrange follow-up. If you do not hear from them within this time frame, please call the number provided.  Contact information: [address] [place] [address] [place] by problem list: 1. NSTEMI 2. TIIDM  3. HTN 4. Tobacco Cessation  [name] is a 44yo female with PMH of CAD s/p LAD & RCA stent in 2017, HTN, Type II diabetes, and tobacco use who presented to the ED with chest pain for one day, found to have an NSTEMI. She had not been taking her medications due to affordability except  for her insulin. She was taken to the cath lab by cardiology and found to have stenosis of the right RCA stent with two collaterals to the RCA. Optimization of medical therapy was recommended. Echo was also done which showed EF of 40-45%, similar to findings on cath in 2017, also now with diastolic grade II dysfunction. She was started on lipitor 80 mg qd, losartan 25 mg and restarted on coreg 12.5 mg bid. She will follow-up with cardiology..  Glucose was elevated throughout admission on SSI and lantus. She states her glucose is also frequently elevated on her current insulin  dosing. Lantus increased to 20U qhs, continued SSI. She has recently reestablished with her PCP and just started taking insulin again a few weeks ago. Discussed potentially starting an SGLT-2 inhibitor, but she was not sure about her current insurance. Recommended she follow-up with her PCP to discuss this. She will follow-up with PCP in one week.  Importance of healthy diet and smoking cessation was discussed. She is very interested in quitting. She was started on nicotine patches to help with this.   Discharge Vitals:   BP 128/76 (BP Location: Left Arm)   Pulse 67   Temp 98 F (36.7 C) (Oral)   Resp 18   Ht 5\' 7"  (1.702 m)   Wt 97.6 kg   SpO2 93%   BMI 33.70 kg/m   Pertinent Labs, Studies, and Procedures:   Cath 06/07/20  Ost LAD to Prox LAD lesion is 100% stenosed.  2nd Mrg lesion is 70% stenosed.  Prox RCA to Mid RCA lesion is 100% stenosed.  SVG graft was visualized by angiography.  Origin to Prox Graft lesion is 100% stenosed.  LIMA graft was visualized by angiography and is large.  The graft exhibits no disease.  LV end diastolic pressure is mildly elevated.   1. 3 vessel obstructive CAD    - 100% proximal LAD    - 70% mid to distal OM2    - 100% proximal RCA 2. Occluded SVG to RCA 3. Patent LIMA to LAD 4. Mildly elevated LVEDP  Plan: optimal medical therapy and risk factor modification. No targets for PCI. RCA does have some collaterals   Echo 06/08/2020 1. Left ventricular ejection fraction, by estimation, is 40 to 45%. The  left ventricle has mildly decreased function. The left ventricle  demonstrates global hypokinesis. The left ventricular internal cavity size  was mildly dilated. There is mild  concentric left ventricular hypertrophy. Left ventricular diastolic  parameters are consistent with Grade II diastolic dysfunction  (pseudonormalization). Elevated left atrial pressure.  2. Right ventricular systolic function is normal. The right ventricular    size is normal.  3. Left atrial size was moderately dilated.  4. The mitral valve is normal in structure. Mild mitral valve  regurgitation. No evidence of mitral stenosis.  5. The aortic valve is normal in structure. Aortic valve regurgitation is  not visualized. No aortic stenosis is present.  6. The inferior vena cava is dilated in size with <50% respiratory  variability, suggesting right atrial pressure of 15 mmHg.  DG Chest 06/07/2020 1. Prior median sternotomy. Heart size stable. No pulmonary venous congestion.  2. No acute pulmonary infiltrate. Stable chronic interstitial changes.   Electronically Signed   By: Marcello Moores  Register   On: 06/07/2020 08:53  Discharge Instructions: Discharge Instructions    Diet - low sodium heart healthy   Complete by: As directed    Discharge instructions   Complete by: As directed  You were hospitalized for Non ST elevated myocardial infarction. Thank you for allowing [place] to be part of your care.   Please follow-up with your primary care physician next week.    Please note these changes made to your medications:   Please Start taking:   Coreg 12.5 mg - take one tablet two times per day.   Atorvastatin 80 mg tablet - take one tablet daily   Losartan 25 mg tablet - take one tablet by mouth daily   Nicotine patch - use one patch daily to help with smoking cessation. Do not use the patch and smoke at the same time.   Increase your Tyler Aas to 20U every evening.   Increase activity slowly   Complete by: As directed       Signed: Molli Hazard A, DO 06/09/2020, 12:07 PM   Pager: [phone]

## 2020-06-08 NOTE — Progress Notes (Signed)
Progress Note  Patient Name: [name] Date of Encounter: 06/08/2020  [facility] Cardiologist: Marlou Porch (new)  Subjective   Currently feeling well without chest pain or shortness of breath  Inpatient Medications    Scheduled Meds: . atorvastatin  80 mg Oral Daily  . busPIRone  10 mg Oral BID  . carvedilol  6.25 mg Oral BID WC  . enoxaparin (LOVENOX) injection  40 mg Subcutaneous Q24H  . gabapentin  300 mg Oral Daily  . insulin aspart  0-15 Units Subcutaneous Q4H  . insulin glargine  13 Units Subcutaneous QHS  . nicotine  21 mg Transdermal Daily  . sodium chloride flush  3 mL Intravenous Once  . sodium chloride flush  3 mL Intravenous Q12H  . sodium chloride flush  3 mL Intravenous Q12H  . sodium chloride flush  3 mL Intravenous Q12H   Continuous Infusions: . sodium chloride     PRN Meds: sodium chloride, acetaminophen, sodium chloride flush   Vital Signs    Vitals:   06/08/20 0032 06/08/20 0214 06/08/20 0420 06/08/20 0845  BP: 134/73  (!) 148/78 (!) 149/74  Pulse: 69  71 77  Resp: 17  17 18   Temp: 98 F (36.7 C)  98.3 F (36.8 C) 98.1 F (36.7 C)  TempSrc: Oral  Oral Oral  SpO2: 96%  94% 95%  Weight:  97.6 kg    Height:        Intake/Output Summary (Last 24 hours) at 06/08/2020 0944 Last data filed at 06/08/2020 0827 Gross per 24 hour  Intake 1528.92 ml  Output 900 ml  Net 628.92 ml   Last 3 Weights 06/08/2020 06/07/2020 06/07/2020  Weight (lbs) 215 lb 2.7 oz 209 lb 7 oz 214 lb  Weight (kg) 97.6 kg 95 kg 97.07 kg      Telemetry    Sinus rhythm- Personally Reviewed  ECG    None new- Personally Reviewed  Physical Exam   GEN: No acute distress.   Neck: No JVD Cardiac: RRR, no murmurs, rubs, or gallops.  Respiratory: Clear to auscultation bilaterally. GI: Soft, nontender, non-distended  MS: No edema; No deformity. Neuro:  Nonfocal  Psych: Normal affect   Labs    High Sensitivity Troponin:   Recent Labs  Lab 06/07/20 0805  06/07/20 1000  TROPONINIHS 227* 404*      Chemistry Recent Labs  Lab 06/07/20 0805 06/07/20 1000 06/08/20 0511  NA 136  --  139  K 4.5  --  4.2  CL 106  --  109  CO2 23  --  21*  GLUCOSE 265*  --  160*  BUN 11  --  11  CREATININE 0.67  --  0.53  CALCIUM 9.0  --  8.8*  PROT  --  6.5  --   ALBUMIN  --  3.8  --   AST  --  14*  --   ALT  --  17  --   ALKPHOS  --  47  --   BILITOT  --  0.3  --   GFRNONAA >60  --  >60  GFRAA >60  --  >60  ANIONGAP 7  --  9     Hematology Recent Labs  Lab 06/07/20 0805  WBC 9.1  RBC 5.83*  HGB 15.4*  HCT 49.9*  MCV 85.6  MCH 26.4  MCHC 30.9  RDW 15.7*  PLT 161    BNP Recent Labs  Lab 06/07/20 1000  BNP 173.4*  DDimer No results for input(s): DDIMER in the last 168 hours.   Radiology    DG Chest 2 View  Result Date: 06/07/2020 CLINICAL DATA:  Chest pain. EXAM: CHEST - 2 VIEW COMPARISON:  06/10/2018. FINDINGS: Prior median sternotomy. Heart size stable. No pulmonary venous congestion. No focal infiltrate. Stable mild chronic interstitial prominence. No pleural effusion or pneumothorax. No acute bony abnormality identified. Degenerative change thoracic spine. Stable lower thoracic vertebral body compression fracture. IMPRESSION: 1. Prior median sternotomy. Heart size stable. No pulmonary venous congestion. 2. No acute pulmonary infiltrate. Stable chronic interstitial changes. Electronically Signed   By: Marcello Moores  Register   On: 06/07/2020 08:53   CARDIAC CATHETERIZATION  Result Date: 06/07/2020  Ost LAD to Prox LAD lesion is 100% stenosed.  2nd Mrg lesion is 70% stenosed.  Prox RCA to Mid RCA lesion is 100% stenosed.  SVG graft was visualized by angiography.  Origin to Prox Graft lesion is 100% stenosed.  LIMA graft was visualized by angiography and is large.  The graft exhibits no disease.  LV end diastolic pressure is mildly elevated.  1. 3 vessel obstructive CAD    - 100% proximal LAD    - 70% mid to distal OM2    -  100% proximal RCA 2. Occluded SVG to RCA 3. Patent LIMA to LAD 4. Mildly elevated LVEDP Plan: optimal medical therapy and risk factor modification. No targets for PCI. RCA does have some collaterals.    Cardiac Studies   Left heart catheterization 06/07/2020 1. 3 vessel obstructive CAD    - 100% proximal LAD    - 70% mid to distal OM2    - 100% proximal RCA 2. Occluded SVG to RCA 3. Patent LIMA to LAD 4. Mildly elevated LVEDP   Patient Profile     44 y.o. female with a history of coronary artery disease status post CABG in 2017, ischemic cardiomyopathy, diabetes, hypertension, tobacco abuse presented to the hospital with non-STEMI.  Assessment & Plan    1.  Non-STEMI: Troponin elevated to 404.  Has had a two-vessel CABG.  Left heart catheterization showed occlusion of the vein graft to the RCA with collaterals.  No current chest pain.  2.  Coronary artery disease: Status post two-vessel CABG.  Unfortunately 1 graft is occluded.  She Beniah Magnan need aggressive risk factor modification.  Would continue Lipitor 80 mg a day with a goal LDL less than 70.  Akansha Wyche require blood pressure control less than 130/80 and aggressive diabetes control.  Also smoking cessation would be beneficial.  3.  Hypertension: Plan to increase her carvedilol to 12.5 mg as this is her home dose.    4.  Hyperlipidemia: LDL significantly elevated at 119.  Goal LDL less than 70.  Continue atorvastatin 80 mg.  5.  Ongoing tobacco abuse: Cessation encouraged  6.  Ischemic cardiomyopathy: We Kiki Bivens add losartan today.  This can be titrated up for improved blood pressure control.  For questions or updates, please contact [place] Please consult [email or web address] for contact info under        Signed, Mildred Bollard Meredith Leeds, MD  06/08/2020, 9:44 AM

## 2020-06-08 NOTE — TOC Initial Note (Signed)
Transition of Care [facility]) - Initial/Assessment Note    Patient Details  Name: [name] MRN: [id number] Date of Birth: October 29, 1976  Transition of Care [facility]) CM/SW Contact:    Norina Buzzard, RN Phone Number: 06/08/2020, 11:04 AM  Clinical Narrative: 44 yo pt admitted with dx of nonstemi, s/p L heart cath and cors/grafts angiography.   Met with pt. She plans to return home with the support of her family. She lives with her mother, daughter and granddaughter. She reports that she has a PCP since March. She reports that she drives. She works and she has Insurance underwriter. She reports that so far she is able to fill her prescriptions. Informed pt that the MD may order an SGLT-2 inhibitor but we are not able to check for prescription coverage on the weekends. Informed pt that we can call the pharmacy to check on her co-payment if new meds are ordered. She denies any D/C needs at this time.   Expected Discharge Plan: Home/Self Care Barriers to Discharge: No Barriers Identified   Patient Goals and CMS Choice Patient states their goals for this hospitalization and ongoing recovery are:: to get better      Expected Discharge Plan and Services Expected Discharge Plan: Home/Self Care   Discharge Planning Services: CM Consult   Living arrangements for the past 2 months: Single Family Home                                      Prior Living Arrangements/Services Living arrangements for the past 2 months: Single Family Home Lives with:: Parents, Adult Children, Other (Comment) Patient language and need for interpreter reviewed:: Yes Do you feel safe going back to the place where you live?: Yes        Care giver support system in place?: Yes (comment)      Activities of Daily Living Home Assistive Devices/Equipment: CBG Meter, Eyeglasses ADL Screening (condition at time of admission) Patient's cognitive ability adequate to safely complete daily activities?:  Yes Is the patient deaf or have difficulty hearing?: No Does the patient have difficulty seeing, even when wearing glasses/contacts?: No Does the patient have difficulty concentrating, remembering, or making decisions?: No Patient able to express need for assistance with ADLs?: Yes Does the patient have difficulty dressing or bathing?: No Independently performs ADLs?: Yes (appropriate for developmental age) Does the patient have difficulty walking or climbing stairs?: No Weakness of Legs: None Weakness of Arms/Hands: None  Permission Sought/Granted Permission sought to share information with : Case Manager                Emotional Assessment Appearance:: Well-Groomed Attitude/Demeanor/Rapport: Engaged Affect (typically observed): Accepting, Pleasant, Calm Orientation: : Oriented to Self, Oriented to  Time, Oriented to Situation, Oriented to Place      Admission diagnosis:  NSTEMI (non-ST elevated myocardial infarction) [facility]) [I21.4] Patient Active Problem List   Diagnosis Date Noted  . NSTEMI (non-ST elevated myocardial infarction) ([place]) 06/07/2020  . Acute appendicitis 01/28/2016  . Acute appendicitis with localized peritonitis 01/28/2016  . Renal failure 09/25/2011  . SIRS due to infectious process with acute organ dysfunction ([place]) 09/18/2011  . Obesity 09/18/2011  . Diabetes mellitus type II 08/22/2011  . Liver abscess 08/20/2011  . 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/20/2011   PCP:  System, Pcp Not In Pharmacy:   [id number] [place], [place] - [address]  [place] [address] Phone: [phone] Fax: [phone]     Social Determinants of Health (SDOH) Interventions    Readmission Risk Interventions No flowsheet data found.

## 2020-06-10 ENCOUNTER — Telehealth: Payer: Self-pay | Admitting: Cardiology

## 2020-06-10 ENCOUNTER — Encounter (HOSPITAL_COMMUNITY): Payer: Self-pay | Admitting: Cardiology

## 2020-06-10 NOTE — Telephone Encounter (Signed)
      I went in pt's chart to see what her  Hospital discharge summary said.

## 2020-06-27 ENCOUNTER — Encounter: Payer: Self-pay | Admitting: Cardiology

## 2020-06-27 ENCOUNTER — Ambulatory Visit (INDEPENDENT_AMBULATORY_CARE_PROVIDER_SITE_OTHER): Payer: 59 | Admitting: Cardiology

## 2020-06-27 ENCOUNTER — Other Ambulatory Visit: Payer: Self-pay

## 2020-06-27 DIAGNOSIS — I2510 Atherosclerotic heart disease of native coronary artery without angina pectoris: Secondary | ICD-10-CM | POA: Insufficient documentation

## 2020-06-27 DIAGNOSIS — I200 Unstable angina: Secondary | ICD-10-CM

## 2020-06-27 DIAGNOSIS — R0609 Other forms of dyspnea: Secondary | ICD-10-CM

## 2020-06-27 DIAGNOSIS — R0600 Dyspnea, unspecified: Secondary | ICD-10-CM

## 2020-06-27 DIAGNOSIS — I25810 Atherosclerosis of coronary artery bypass graft(s) without angina pectoris: Secondary | ICD-10-CM

## 2020-06-27 DIAGNOSIS — E785 Hyperlipidemia, unspecified: Secondary | ICD-10-CM

## 2020-06-27 DIAGNOSIS — I209 Angina pectoris, unspecified: Secondary | ICD-10-CM

## 2020-06-27 HISTORY — DX: Other forms of dyspnea: R06.09

## 2020-06-27 HISTORY — DX: Hyperlipidemia, unspecified: E78.5

## 2020-06-27 HISTORY — DX: Dyspnea, unspecified: R06.00

## 2020-06-27 HISTORY — DX: Unstable angina: I20.0

## 2020-06-27 HISTORY — DX: Angina pectoris, unspecified: I20.9

## 2020-06-27 HISTORY — DX: Atherosclerotic heart disease of native coronary artery without angina pectoris: I25.10

## 2020-06-27 MED ORDER — LOSARTAN POTASSIUM 25 MG PO TABS: 25.0000 mg | ORAL_TABLET | Freq: Every day | ORAL | 1 refills | Status: DC

## 2020-06-27 MED ORDER — ASPIRIN EC 81 MG PO TBEC: 81.0000 mg | DELAYED_RELEASE_TABLET | Freq: Every day | ORAL | 3 refills | Status: DC

## 2020-06-27 MED ORDER — ISOSORBIDE MONONITRATE ER 30 MG PO TB24: 30.0000 mg | ORAL_TABLET | Freq: Every day | ORAL | 1 refills | Status: DC

## 2020-06-27 NOTE — Progress Notes (Signed)
Cardiology Office Note:    Date:  06/27/2020   ID:  [name], DOB [birth year], MRN [id number]  PCP:  System, Pcp Not In  Cardiologist:  Jenne Campus, MD    Referring MD: No ref. provider found   No chief complaint on file. I am doing much better  History of Present Illness:    [name] is a 44 y.o. female with past medical history significant for coronary artery disease status post coronary artery bypass graft done in 2017 with LIMA to LAD SVG to RCA, essential hypertension, type 2 diabetes, noncompliance.  She ended up going to [facility] about 2 weeks ago because of chest pain.  She ruled in for non-STEMI, cardiac catheterization has been done which showed completely occluded graft going to the right coronary artery as well as completely occluded right coronary artery.  LIMA to LAD was patent, left anterior descending artery was completely occluded.  Likely right coronary artery which was completely occluded did have good collateralization.  Medical therapy was restarted.  Until she came to hospital this time she did not take any medication for her heart not even aspirin.  She comes today to my office for follow-up overall she is feeling better but complain of having shortness of breath fatigue and sometimes chest pain when she lay down at night.  She said she take nitroglycerin with good relief.  Walking moving around does give her some shortness of breath but overall seems to be doing fine from that point review.  Past Medical History:  Diagnosis Date  . Abnormal Pap smear   . Anemia   . Cardiomyopathy ([place])    "postpartem cardiomyopathy"- 2012- has since been released by cardiology  . Depression   . Diabetes mellitus   . Hypertension   . Liver abscess    2012- Terrial Rhodes drained placed for awhile.  . Migraine    past x1 -none since.  . Tobacco abuse     Past Surgical History:  Procedure Laterality Date  . APPENDECTOMY    . CORONARY ARTERY BYPASS GRAFT   2017   [place]     90's- cervix  . DILATION AND CURETTAGE OF UTERUS  1994  . LAPAROSCOPIC APPENDECTOMY N/A 01/28/2016   Procedure: APPENDECTOMY LAPAROSCOPIC;  Surgeon: Stark Klein, MD;  Location: [facility];  Service: General;  Laterality: N/A;  . LEFT HEART CATH AND CORS/GRAFTS ANGIOGRAPHY N/A 06/07/2020   Procedure: LEFT HEART CATH AND CORS/GRAFTS ANGIOGRAPHY;  Surgeon: Martinique, Peter M, MD;  Location: [facility];  Service: Cardiovascular;  Laterality: N/A;  . ROBOTIC ASSISTED SALPINGO OOPHERECTOMY Right 03/24/2016   Procedure: XI ROBOTIC ASSISTED LAPAROSCOPIC RIGHT SALPINGO OOPHORECTOMY;  Surgeon: Everitt Amber, MD;  Location: [facility];  Service: Gynecology;  Laterality: Right;  . TONSILLECTOMY      Current Medications: Current Meds  Medication Sig  . atorvastatin (LIPITOR) 80 MG tablet Take 1 tablet (80 mg total) by mouth daily.  . busPIRone (BUSPAR) 10 MG tablet Take 10 mg by mouth 2 (two) times daily.  . carvedilol (COREG) 12.5 MG tablet Take 1 tablet (12.5 mg total) by mouth 2 (two) times daily with a meal.  . gabapentin (NEURONTIN) 300 MG capsule Take 300 mg by mouth daily.  . insulin degludec (TRESIBA FLEXTOUCH) 100 UNIT/ML FlexTouch Pen Inject 0.2 mLs (20 Units total) into the skin at bedtime.  . insulin lispro (HUMALOG) 100 UNIT/ML injection Inject 2-10 Units into the skin 3 (three) times daily  before meals. Per sliding scale: CBG 150-200 = 2 units,  201-250= 4 units, 251-300= 6 units, 301-350= 8 units, 351-400 = 10 units, above 400 call MD  . losartan (COZAAR) 25 MG tablet Take 1 tablet (25 mg total) by mouth daily.  . metFORMIN (GLUCOPHAGE) 1000 MG tablet Take 1 tablet (1,000 mg total) by mouth 2 (two) times daily.  . nicotine (NICODERM CQ - DOSED IN MG/24 HOURS) 21 mg/24hr patch Place 1 patch (21 mg total) onto the skin daily.  . [DISCONTINUED] losartan (COZAAR) 25 MG tablet Take 1 tablet (25 mg total) by mouth daily.     Allergies:   Patient  has no known allergies.   Social History   Socioeconomic History  . Marital status: Single    Spouse name: Not on file  . Number of children: Not on file  . Years of education: Not on file  . Highest education level: Not on file  Occupational History  . Not on file  Tobacco Use  . Smoking status: Current Every Day Smoker    Packs/day: 0.50    Years: 21.00    Pack years: 10.50    Types: Cigarettes  . Smokeless tobacco: Never Used  Substance and Sexual Activity  . Alcohol use: No    Comment: rare social  . Drug use: No    Comment: x1 episode "Meth" use.  Marland Kitchen Sexual activity: Not Currently    Birth control/protection: None  Other Topics Concern  . Not on file  Social History Narrative  . Not on file   Social Determinants of Health   Financial Resource Strain:   . Difficulty of Paying Living Expenses:   Food Insecurity:   . Worried About Charity fundraiser in the Last Year:   . Arboriculturist in the Last Year:   Transportation Needs:   . Film/video editor (Medical):   Marland Kitchen Lack of Transportation (Non-Medical):   Physical Activity:   . Days of Exercise per Week:   . Minutes of Exercise per Session:   Stress:   . Feeling of Stress :   Social Connections:   . Frequency of Communication with Friends and Family:   . Frequency of Social Gatherings with Friends and Family:   . Attends Religious Services:   . Active Member of Clubs or Organizations:   . Attends Archivist Meetings:   Marland Kitchen Marital Status:      Family History: The patient's family history includes Diabetes in her maternal grandmother and paternal grandfather. ROS:   Please see the history of present illness.    All 14 point review of systems negative except as described per history of present illness  EKGs/Labs/Other Studies Reviewed:      Recent Labs: 06/07/2020: ALT 17; B Natriuretic Peptide 173.4; Hemoglobin 15.4; Platelets 161 06/08/2020: BUN 11; Creatinine, Ser 0.53; Potassium 4.2;  Sodium 139  Recent Lipid Panel    Component Value Date/Time   CHOL 178 06/07/2020 1422   TRIG 115 06/07/2020 1422   HDL 36 (L) 06/07/2020 1422   CHOLHDL 4.9 06/07/2020 1422   VLDL 23 06/07/2020 1422   LDLCALC 119 (H) 06/07/2020 1422    Physical Exam:    VS:  BP (!) 158/80 (BP Location: Left Arm, Patient Position: Sitting, Cuff Size: Normal)   Pulse 88   Ht 5\' 7"  (1.702 m)   Wt 208 lb 12.8 oz (94.7 kg)   SpO2 95%   BMI 32.70 kg/m     Wt  Readings from Last 3 Encounters:  06/27/20 208 lb 12.8 oz (94.7 kg)  06/08/20 215 lb 2.7 oz (97.6 kg)  06/10/18 222 lb (100.7 kg)     GEN:  Well nourished, well developed in no acute distress HEENT: Normal NECK: No JVD; No carotid bruits LYMPHATICS: No lymphadenopathy CARDIAC: RRR, no murmurs, no rubs, no gallops RESPIRATORY:  Clear to auscultation without rales, wheezing or rhonchi  ABDOMEN: Soft, non-tender, non-distended MUSCULOSKELETAL:  No edema; No deformity  SKIN: Warm and dry LOWER EXTREMITIES: no swelling NEUROLOGIC:  Alert and oriented x 3 PSYCHIATRIC:  Normal affect   ASSESSMENT:    1. Coronary artery disease involving coronary bypass graft of native heart without angina pectoris   2. Angina pectoris ([place])   3. Dyspnea on exertion   4. Dyslipidemia    PLAN:    In order of problems listed above:  1. Coronary artery disease status post coronary bypass grafting 2017, latest cardiac catheterization showing occlusion graft to RCA with complete occluded RCA, LIMA to LAD still patent.  Collateralization to RCA was present.  She is on appropriate medication but still have some symptomatology.  I will put her on Imdur 30 mg daily on top of all other medication she is taking.  Also during my conversation with her today I discovered that she was not taking aspirin.  Obviously she needs to start that medications. 2. Angina pectoris hopefully will be improving with appropriate medications. 3. Dyspnea on exertion multifactorial could  be angina equivalent we will see how she respond to our therapy.  It could be also related to blood pressure which is still not controlled. 4. Dyslipidemia she is taking 80 mg of Lipitor which I will continue.  I will check her fasting lipid profile when I see her next time. 5. Smoking: Obviously still a big problem she understand that she is working on quitting.  She is nicotine patch. 6. Diabetes: Follow-up by internal medicine team. 7. I stressed importance of compliance with medications.  Since she does have cardiomyopathy with ejection fraction 45% we need to also increase the dose of ARB hopefully put him on Entresto.  What I also discovered during my interview today is the fact that she did not get prescription for because her.  Obviously we will give to her this medication and will recheck Chem-7.   Medication Adjustments/Labs and Tests Ordered: Current medicines are reviewed at length with the patient today.  Concerns regarding medicines are outlined above.  No orders of the defined types were placed in this encounter.  Medication changes:  Meds ordered this encounter  Medications  . aspirin EC 81 MG tablet    Sig: Take 1 tablet (81 mg total) by mouth daily. Swallow whole.    Dispense:  90 tablet    Refill:  3  . isosorbide mononitrate (IMDUR) 30 MG 24 hr tablet    Sig: Take 1 tablet (30 mg total) by mouth daily.    Dispense:  90 tablet    Refill:  1  . losartan (COZAAR) 25 MG tablet    Sig: Take 1 tablet (25 mg total) by mouth daily.    Dispense:  90 tablet    Refill:  1    Signed, Park Liter, MD, [facility] 06/27/2020 5:18 PM    [facility]

## 2020-06-27 NOTE — Patient Instructions (Signed)
Medication Instructions:  Your physician has recommended you make the following change in your medication:  START: aspirin 81 daily   START: imdur 30 mg daily   START: losartan 25 mg daily   *If you need a refill on your cardiac medications before your next appointment, please call your pharmacy*   Lab Work: None.  If you have labs (blood work) drawn today and your tests are completely normal, you will receive your results only by: Marland Kitchen MyChart Message (if you have MyChart) OR . A paper copy in the mail If you have any lab test that is abnormal or we need to change your treatment, we will call you to review the results.   Testing/Procedures: None.    Follow-Up: At [facility], you and your health needs are our priority.  As part of our continuing mission to provide you with exceptional heart care, we have created designated Provider Care Teams.  These Care Teams include your primary Cardiologist (physician) and Advanced Practice Providers (APPs -  Physician Assistants and Nurse Practitioners) who all work together to provide you with the care you need, when you need it.  We recommend signing up for the patient portal called "MyChart".  Sign up information is provided on this After Visit Summary.  MyChart is used to connect with patients for Virtual Visits (Telemedicine).  Patients are able to view lab/test results, encounter notes, upcoming appointments, etc.  Non-urgent messages can be sent to your provider as well.   To learn more about what you can do with MyChart, go to [email or web address].    Your next appointment:   1 month(s)  The format for your next appointment:   In Person  Provider:   Jenne Campus, MD   Other Instructions   Aspirin and Your Heart  Aspirin is a medicine that prevents the cells in the blood that are used for clotting, called platelets, from sticking together. Aspirin can be used to help reduce the risk of blood clots, heart attacks, and  other heart-related problems. Can I take aspirin? Your health care provider will help you determine whether it is safe and beneficial for you to take aspirin daily. Taking aspirin daily may be helpful if you:  Have had a heart attack or chest pain.  Are at risk for a heart attack.  Have undergone open-heart surgery, such as coronary artery bypass surgery (CABG).  Have had coronary angioplasty or a stent.  Have had certain types of stroke or transient ischemic attack (TIA).  Have peripheral artery disease (PAD).  Have chronic heart rhythm problems such as atrial fibrillation and cannot take an anticoagulant.  Have valve disease or have had surgery on a valve. What are the risks? Daily use of aspirin can cause side effects. Some of these include:  Bleeding. Bleeding problems can be minor or serious. An example of a minor problem is a cut that does not stop bleeding. An example of a more serious problem is stomach bleeding or, rarely, bleeding into the brain. Your risk of bleeding is increased if you are also taking non-steroidal anti-inflammatory drugs (NSAIDs).  Increased bruising.  Upset stomach.  An allergic reaction. People who have nasal polyps have an increased risk of developing an aspirin allergy. General guidelines  Take aspirin only as told by your health care provider. Make sure that you understand how much you should take and what form you should take. The two forms of aspirin are: ? Non-enteric-coated.This type of aspirin does not have a coating  and is absorbed quickly. This type of aspirin also comes in a chewable form. ? Enteric-coated. This type of aspirin has a coating that releases the medicine very slowly. Enteric-coated aspirin might cause less stomach upset than non-enteric-coated aspirin. This type of aspirin should not be chewed or crushed.  Limit alcohol intake to no more than 1 drink a day for nonpregnant women and 2 drinks a day for men. Drinking alcohol  increases your risk of bleeding. One drink equals 12 oz of beer, 5 oz of wine, or 1 oz of hard liquor. Contact a health care provider if you:  Have unusual bleeding or bruising.  Have stomach pain or nausea.  Have ringing in your ears.  Have an allergic reaction that causes: ? Hives. ? Itchy skin. ? Swelling of the lips, tongue, or face. Get help right away if you:  Notice that your bowel movements are bloody, dark red, or black in color.  Vomit or cough up blood.  Have blood in your urine.  Cough, have noisy breathing (wheeze), or feel short of breath.  Have chest pain, especially if the pain spreads to the arms, back, neck, or jaw.  Have a severe headache, or a headache with confusion, or dizziness. These symptoms may represent a serious problem that is an emergency. Do not wait to see if the symptoms will go away. Get medical help right away. Call your local emergency services (911 in the [place] Do not drive yourself to the hospital. Summary  Aspirin can be used to help reduce the risk of blood clots, heart attacks, and other heart-related problems.  Daily use of aspirin can increase your risk of side effects. Your health care provider will help you determine whether it is safe and beneficial for you to take aspirin daily.  Take aspirin only as told by your health care provider. Make sure that you understand how much you can take and what form you can take. This information is not intended to replace advice given to you by your health care provider. Make sure you discuss any questions you have with your health care provider. Document Revised: 10/07/2017 Document Reviewed: 10/07/2017 Elsevier Patient Education  [place].    Isosorbide Mononitrate extended-release tablets What is this medicine? ISOSORBIDE MONONITRATE (eye soe SOR bide mon oh NYE trate) is a vasodilator. It relaxes blood vessels, increasing the blood and oxygen supply to your heart. This medicine  is used to prevent chest pain caused by angina. It will not help to stop an episode of chest pain. This medicine may be used for other purposes; ask your health care provider or pharmacist if you have questions. COMMON BRAND NAME(S): Imdur, Isotrate ER What should I tell my health care provider before I take this medicine? They need to know if you have any of these conditions: previous heart attack or heart failure an unusual or allergic reaction to isosorbide mononitrate, nitrates, other medicines, foods, dyes, or preservatives pregnant or trying to get pregnant breast-feeding How should I use this medicine? Take this medicine by mouth with a glass of water. Follow the directions on the prescription label. Do not crush or chew. Take your medicine at regular intervals. Do not take your medicine more often than directed. Do not stop taking this medicine except on the advice of your doctor or health care professional. Talk to your pediatrician regarding the use of this medicine in children. Special care may be needed. Overdosage: If you think you have taken too much of  this medicine contact a poison control center or emergency room at once. NOTE: This medicine is only for you. Do not share this medicine with others. What if I miss a dose? If you miss a dose, take it as soon as you can. If it is almost time for your next dose, take only that dose. Do not take double or extra doses. What may interact with this medicine? Do not take this medicine with any of the following medications: medicines used to treat erectile dysfunction (ED) like avanafil, sildenafil, tadalafil, and vardenafil riociguat This medicine may also interact with the following medications: medicines for high blood pressure other medicines for angina or heart failure This list may not describe all possible interactions. Give your health care provider a list of all the medicines, herbs, non-prescription drugs, or dietary  supplements you use. Also tell them if you smoke, drink alcohol, or use illegal drugs. Some items may interact with your medicine. What should I watch for while using this medicine? Check your heart rate and blood pressure regularly while you are taking this medicine. Ask your doctor or health care professional what your heart rate and blood pressure should be and when you should contact him or her. Tell your doctor or health care professional if you feel your medicine is no longer working. You may get dizzy. Do not drive, use machinery, or do anything that needs mental alertness until you know how this medicine affects you. To reduce the risk of dizzy or fainting spells, do not sit or stand up quickly, especially if you are an older patient. Alcohol can make you more dizzy, and increase flushing and rapid heartbeats. Avoid alcoholic drinks. Do not treat yourself for coughs, colds, or pain while you are taking this medicine without asking your doctor or health care professional for advice. Some ingredients may increase your blood pressure. What side effects may I notice from receiving this medicine? Side effects that you should report to your doctor or health care professional as soon as possible: bluish discoloration of lips, fingernails, or palms of hands irregular heartbeat, palpitations low blood pressure nausea, vomiting persistent headache unusually weak or tired Side effects that usually do not require medical attention (report to your doctor or health care professional if they continue or are bothersome): flushing of the face or neck rash This list may not describe all possible side effects. Call your doctor for medical advice about side effects. You may report side effects to FDA at 1-800-FDA-1088. Where should I keep my medicine? Keep out of the reach of children. Store between 15 and 30 degrees C (59 and 86 degrees F). Keep container tightly closed. Throw away any unused medicine after  the expiration date. NOTE: This sheet is a summary. It may not cover all possible information. If you have questions about this medicine, talk to your doctor, pharmacist, or health care provider.  2020 Elsevier/Gold Standard (2013-10-06 14:48:19)

## 2020-07-31 DIAGNOSIS — F32A Depression, unspecified: Secondary | ICD-10-CM | POA: Insufficient documentation

## 2020-07-31 DIAGNOSIS — D649 Anemia, unspecified: Secondary | ICD-10-CM | POA: Insufficient documentation

## 2020-07-31 DIAGNOSIS — Z720 Tobacco use: Secondary | ICD-10-CM | POA: Insufficient documentation

## 2020-07-31 DIAGNOSIS — I10 Essential (primary) hypertension: Secondary | ICD-10-CM | POA: Insufficient documentation

## 2020-07-31 DIAGNOSIS — I429 Cardiomyopathy, unspecified: Secondary | ICD-10-CM | POA: Insufficient documentation

## 2020-08-01 ENCOUNTER — Ambulatory Visit (INDEPENDENT_AMBULATORY_CARE_PROVIDER_SITE_OTHER): Payer: 59 | Admitting: Cardiology

## 2020-08-01 ENCOUNTER — Other Ambulatory Visit: Payer: Self-pay | Admitting: Internal Medicine

## 2020-08-01 ENCOUNTER — Encounter: Payer: Self-pay | Admitting: Cardiology

## 2020-08-01 ENCOUNTER — Other Ambulatory Visit: Payer: Self-pay

## 2020-08-01 VITALS — BP 140/76 | HR 80 | Ht 67.0 in | Wt 220.0 lb

## 2020-08-01 DIAGNOSIS — Z720 Tobacco use: Secondary | ICD-10-CM

## 2020-08-01 DIAGNOSIS — I25810 Atherosclerosis of coronary artery bypass graft(s) without angina pectoris: Secondary | ICD-10-CM

## 2020-08-01 DIAGNOSIS — I209 Angina pectoris, unspecified: Secondary | ICD-10-CM | POA: Diagnosis not present

## 2020-08-01 DIAGNOSIS — E785 Hyperlipidemia, unspecified: Secondary | ICD-10-CM | POA: Diagnosis not present

## 2020-08-01 DIAGNOSIS — R0600 Dyspnea, unspecified: Secondary | ICD-10-CM

## 2020-08-01 DIAGNOSIS — Z951 Presence of aortocoronary bypass graft: Secondary | ICD-10-CM

## 2020-08-01 DIAGNOSIS — R0609 Other forms of dyspnea: Secondary | ICD-10-CM

## 2020-08-01 MED ORDER — ISOSORBIDE MONONITRATE ER 60 MG PO TB24: 60.0000 mg | ORAL_TABLET | Freq: Every day | ORAL | 1 refills | Status: DC

## 2020-08-01 NOTE — Progress Notes (Signed)
Cardiology Office Note:    Date:  08/01/2020   ID:  [name], DOB December 25, 1975, MRN [id number]  PCP:  Zoila Shutter, NP  Cardiologist:  Jenne Campus, MD    Referring MD: No ref. provider found   Chief Complaint  Patient presents with  . Follow-up  I am doing much better on Imdur  History of Present Illness:    [name] is a 44 y.o. female   with past medical history significant for coronary artery disease status post coronary artery bypass graft done in 2017 with LIMA to LAD SVG to RCA, essential hypertension, type 2 diabetes, noncompliance.  She ended up going to [facility] about 2 weeks ago because of chest pain.  She ruled in for non-STEMI, cardiac catheterization has been done which showed completely occluded graft going to the right coronary artery as well as completely occluded right coronary artery.  LIMA to LAD was patent, left anterior descending artery was completely occluded.  Likely right coronary artery which was completely occluded did have good collateralization.  Medical therapy was restarted.  She comes today 2 months for follow-up overall she is feeling better with Imdur 30.  Initially she had some chest pain however now she is fine.  Still described to have some word episode with extreme exertion.  However, she returned back to work and she is able to continue working with some physical work as well.  Past Medical History:  Diagnosis Date  . Abnormal Pap smear   . Anemia   . Cardiomyopathy ([place])    "postpartem cardiomyopathy"- 2012- has since been released by cardiology  . Depression   . Diabetes mellitus   . Hypertension   . Liver abscess    2012- Terrial Rhodes drained placed for awhile.  . Migraine    past x1 -none since.  . Tobacco abuse     Past Surgical History:  Procedure Laterality Date  . APPENDECTOMY    . CORONARY ARTERY BYPASS GRAFT  2017   [place]     90's- cervix  . DILATION AND  CURETTAGE OF UTERUS  1994  . LAPAROSCOPIC APPENDECTOMY N/A 01/28/2016   Procedure: APPENDECTOMY LAPAROSCOPIC;  Surgeon: Stark Klein, MD;  Location: [place];  Service: General;  Laterality: N/A;  . LEFT HEART CATH AND CORS/GRAFTS ANGIOGRAPHY N/A 06/07/2020   Procedure: LEFT HEART CATH AND CORS/GRAFTS ANGIOGRAPHY;  Surgeon: Martinique, Peter M, MD;  Location: [place] [facility];  Service: Cardiovascular;  Laterality: N/A;  . ROBOTIC ASSISTED SALPINGO OOPHERECTOMY Right 03/24/2016   Procedure: XI ROBOTIC ASSISTED LAPAROSCOPIC RIGHT SALPINGO OOPHORECTOMY;  Surgeon: Everitt Amber, MD;  Location: [facility];  Service: Gynecology;  Laterality: Right;  . TONSILLECTOMY      Current Medications: Current Meds  Medication Sig  . aspirin EC 81 MG tablet Take 1 tablet (81 mg total) by mouth daily. Swallow whole.  Marland Kitchen atorvastatin (LIPITOR) 80 MG tablet Take 1 tablet (80 mg total) by mouth daily.  . busPIRone (BUSPAR) 10 MG tablet Take 20 mg by mouth 2 (two) times daily.  . carvedilol (COREG) 12.5 MG tablet Take 1 tablet (12.5 mg total) by mouth 2 (two) times daily with a meal.  . gabapentin (NEURONTIN) 300 MG capsule Take 300 mg by mouth 3 (three) times daily.  . insulin degludec (TRESIBA FLEXTOUCH) 100 UNIT/ML FlexTouch Pen Inject 0.2 mLs (20 Units total) into the skin at bedtime.  . insulin lispro (HUMALOG) 100 UNIT/ML injection Inject 2-10 Units into  the skin 3 (three) times daily before meals. Per sliding scale: CBG 150-200 = 2 units,  201-250= 4 units, 251-300= 6 units, 301-350= 8 units, 351-400 = 10 units, above 400 call MD  . isosorbide mononitrate (IMDUR) 60 MG 24 hr tablet Take 1 tablet (60 mg total) by mouth daily.  Marland Kitchen losartan (COZAAR) 25 MG tablet Take 1 tablet (25 mg total) by mouth daily.  . [DISCONTINUED] isosorbide mononitrate (IMDUR) 30 MG 24 hr tablet Take 1 tablet (30 mg total) by mouth daily.     Allergies:   Patient has no known allergies.   Social History   Socioeconomic History  . Marital status:  Single    Spouse name: Not on file  . Number of children: Not on file  . Years of education: Not on file  . Highest education level: Not on file  Occupational History  . Not on file  Tobacco Use  . Smoking status: Current Every Day Smoker    Packs/day: 0.50    Years: 21.00    Pack years: 10.50    Types: Cigarettes  . Smokeless tobacco: Never Used  Substance and Sexual Activity  . Alcohol use: No    Comment: rare social  . Drug use: No    Comment: x1 episode "Meth" use.  Marland Kitchen Sexual activity: Not Currently    Birth control/protection: None  Other Topics Concern  . Not on file  Social History Narrative  . Not on file   Social Determinants of Health   Financial Resource Strain:   . Difficulty of Paying Living Expenses:   Food Insecurity:   . Worried About Charity fundraiser in the Last Year:   . Arboriculturist in the Last Year:   Transportation Needs:   . Film/video editor (Medical):   Marland Kitchen Lack of Transportation (Non-Medical):   Physical Activity:   . Days of Exercise per Week:   . Minutes of Exercise per Session:   Stress:   . Feeling of Stress :   Social Connections:   . Frequency of Communication with Friends and Family:   . Frequency of Social Gatherings with Friends and Family:   . Attends Religious Services:   . Active Member of Clubs or Organizations:   . Attends Archivist Meetings:   Marland Kitchen Marital Status:      Family History: The patient's family history includes Diabetes in her maternal grandmother and paternal grandfather. ROS:   Please see the history of present illness.    All 14 point review of systems negative except as described per history of present illness  EKGs/Labs/Other Studies Reviewed:      Recent Labs: 06/07/2020: ALT 17; B Natriuretic Peptide 173.4; Hemoglobin 15.4; Platelets 161 06/08/2020: BUN 11; Creatinine, Ser 0.53; Potassium 4.2; Sodium 139  Recent Lipid Panel    Component Value Date/Time   CHOL 178 06/07/2020 1422    TRIG 115 06/07/2020 1422   HDL 36 (L) 06/07/2020 1422   CHOLHDL 4.9 06/07/2020 1422   VLDL 23 06/07/2020 1422   LDLCALC 119 (H) 06/07/2020 1422    Physical Exam:    VS:  BP 140/76 (BP Location: Right Arm, Patient Position: Sitting, Cuff Size: Normal)   Pulse 80   Ht 5\' 7"  (1.702 m)   Wt 220 lb (99.8 kg)   SpO2 95%   BMI 34.46 kg/m     Wt Readings from Last 3 Encounters:  08/01/20 220 lb (99.8 kg)  06/27/20 208 lb 12.8 oz (  94.7 kg)  06/08/20 215 lb 2.7 oz (97.6 kg)     GEN:  Well nourished, well developed in no acute distress HEENT: Normal NECK: No JVD; No carotid bruits LYMPHATICS: No lymphadenopathy CARDIAC: RRR, no murmurs, no rubs, no gallops RESPIRATORY:  Clear to auscultation without rales, wheezing or rhonchi  ABDOMEN: Soft, non-tender, non-distended MUSCULOSKELETAL:  No edema; No deformity  SKIN: Warm and dry LOWER EXTREMITIES: no swelling NEUROLOGIC:  Alert and oriented x 3 PSYCHIATRIC:  Normal affect   ASSESSMENT:    1. Dyslipidemia   2. Coronary artery disease involving coronary bypass graft of native heart without angina pectoris   3. Angina pectoris ([place])   4. Tobacco abuse   5. Dyspnea on exertion   6. History of coronary artery bypass graft    PLAN:    In order of problems listed above:  1. Dyslipidemia.  I will ask you to have fasting lipid profile done in about 2 months.  That will be 6 weeks after we augmented her therapy. 2. Coronary artery disease stable from that point review.  I will increase dose of Imdur from 30-60 hopefully make her feel better. 3. Angina pectoris.  Overall doing better.  I will increase dose of Imdur. 4. Tobacco abuse: Still ongoing.  I spent at least 5 minutes talking to her about need to quit she understand that she will try to do it. 5. Dyspnea exertion: Multifactorial.   Medication Adjustments/Labs and Tests Ordered: Current medicines are reviewed at length with the patient today.  Concerns regarding medicines  are outlined above.  Orders Placed This Encounter  Procedures  . Lipid panel   Medication changes:  Meds ordered this encounter  Medications  . isosorbide mononitrate (IMDUR) 60 MG 24 hr tablet    Sig: Take 1 tablet (60 mg total) by mouth daily.    Dispense:  90 tablet    Refill:  1    Signed, Park Liter, MD, [facility] 08/01/2020 3:37 PM    [facility]

## 2020-08-01 NOTE — Patient Instructions (Signed)
Medication Instructions:  Your physician has recommended you make the following change in your medication:   INCREASE: IMDUR 60 mg daily   *If you need a refill on your cardiac medications before your next appointment, please call your pharmacy*   Lab Work: Your physician recommends that you return for lab work in 1 month: lipid   If you have labs (blood work) drawn today and your tests are completely normal, you will receive your results only by: Marland Kitchen MyChart Message (if you have MyChart) OR . A paper copy in the mail If you have any lab test that is abnormal or we need to change your treatment, we will call you to review the results.   Testing/Procedures: None.    Follow-Up: At [facility], you and your health needs are our priority.  As part of our continuing mission to provide you with exceptional heart care, we have created designated Provider Care Teams.  These Care Teams include your primary Cardiologist (physician) and Advanced Practice Providers (APPs -  Physician Assistants and Nurse Practitioners) who all work together to provide you with the care you need, when you need it.  We recommend signing up for the patient portal called "MyChart".  Sign up information is provided on this After Visit Summary.  MyChart is used to connect with patients for Virtual Visits (Telemedicine).  Patients are able to view lab/test results, encounter notes, upcoming appointments, etc.  Non-urgent messages can be sent to your provider as well.   To learn more about what you can do with MyChart, go to [email or web address].    Your next appointment:   3 month(s)  The format for your next appointment:   In Person  Provider:   Jenne Campus, MD   Other Instructions

## 2020-08-13 ENCOUNTER — Other Ambulatory Visit: Payer: Self-pay | Admitting: Cardiology

## 2020-08-13 MED ORDER — CARVEDILOL 12.5 MG PO TABS: 12.5000 mg | ORAL_TABLET | Freq: Two times a day (BID) | ORAL | 3 refills | Status: DC

## 2020-08-13 NOTE — Telephone Encounter (Signed)
Refill sent in per request.  

## 2020-08-13 NOTE — Telephone Encounter (Signed)
*  STAT* If patient is at the pharmacy, call can be transferred to refill team.   1. Which medications need to be refilled? (please list name of each medication and dose if known) carvedilol (COREG) 12.5 MG tablet  2. Which pharmacy/location (including street and city if local pharmacy) is medication to be sent to? [id number] - [place], [place] - [address]  3. Do they need a 30 day or 90 day supply? 90 day  Patient is out of medication.

## 2020-09-03 LAB — LIPID PANEL
Chol/HDL Ratio: 3.5 ratio (ref 0.0–4.4)
Cholesterol, Total: 113 mg/dL (ref 100–199)
HDL: 32 mg/dL — ABNORMAL LOW (ref >39)
LDL Chol Calc (NIH): 62 mg/dL (ref 0–99)
Triglycerides: 100 mg/dL (ref 0–149)
VLDL Cholesterol Cal: 19 mg/dL (ref 5–40)

## 2020-09-04 ENCOUNTER — Telehealth: Payer: Self-pay

## 2020-09-04 NOTE — Telephone Encounter (Signed)
-----   Message from Berniece Salines, DO sent at 09/04/2020  1:05 PM EDT ----- HDL is still low but overall lipid profile looks good.  Continue current medication regimen.

## 2020-09-04 NOTE — Telephone Encounter (Signed)
Left message on patients voicemail to please return our call.   

## 2020-09-06 NOTE — Telephone Encounter (Signed)
Results reviewed with pt as per Dr. Tobb's note.  Pt verbalized understanding and had no additional questions. Routed to PCP 

## 2020-09-06 NOTE — Telephone Encounter (Signed)
[name] is returning [name]'s call.

## 2020-10-14 DIAGNOSIS — U071 COVID-19: Secondary | ICD-10-CM

## 2020-10-14 HISTORY — DX: COVID-19: U07.1

## 2020-11-01 ENCOUNTER — Other Ambulatory Visit: Payer: Self-pay

## 2020-11-01 ENCOUNTER — Encounter: Payer: Self-pay | Admitting: Cardiology

## 2020-11-01 ENCOUNTER — Ambulatory Visit (INDEPENDENT_AMBULATORY_CARE_PROVIDER_SITE_OTHER): Payer: PRIVATE HEALTH INSURANCE | Admitting: Cardiology

## 2020-11-01 DIAGNOSIS — I255 Ischemic cardiomyopathy: Secondary | ICD-10-CM

## 2020-11-01 DIAGNOSIS — Z951 Presence of aortocoronary bypass graft: Secondary | ICD-10-CM

## 2020-11-01 DIAGNOSIS — I10 Essential (primary) hypertension: Secondary | ICD-10-CM | POA: Diagnosis not present

## 2020-11-01 DIAGNOSIS — Z720 Tobacco use: Secondary | ICD-10-CM | POA: Diagnosis not present

## 2020-11-01 NOTE — Progress Notes (Signed)
Cardiology Office Note:    Date:  11/01/2020   ID:  [name], DOB 08-29-1976, MRN [id number]  PCP:  Zoila Shutter, NP  Cardiologist:  Jenne Campus, MD    Referring MD: Zoila Shutter, NP   Chief Complaint  Patient presents with  . Cough  I had Covid infection but now recovering  History of Present Illness:    [name] is a 44 y.o. female  with past medical history significant for coronary artery disease status post coronary artery bypass graft done in 2017 with LIMA to LAD SVG to RCA, essential hypertension, type 2 diabetes, noncompliance. She ended up going to [facility] about 2 weeks ago because of chest pain. She ruled in for non-STEMI, cardiac catheterization has been done which showed completely occluded graft going to the right coronary artery as well as completely occluded right coronary artery. LIMA to LAD was patent, left anterior descending artery was completely occluded. Likely right coronary artery which was completely occluded did have good collateralization.  Comes today to my office for follow-up last month she did have COVID-19 infection.  Still recovering from it.  I did end up getting antibody infusion which was sick after that but now slowly getting better still little bit shortness of breath some cough no chest pain tightness squeezing pressure burning chest that would suggest reactivation of coronary disease  Past Medical History:  Diagnosis Date  . Abnormal Pap smear   . Anemia   . Cardiomyopathy ([place])    "postpartem cardiomyopathy"- 2012- has since been released by cardiology  . COVID 10/14/2020   c/o cough and chest pressure   . Depression   . Diabetes mellitus   . Hypertension   . Liver abscess    2012- Terrial Rhodes drained placed for awhile.  . Migraine    past x1 -none since.  . Tobacco abuse     Past Surgical History:  Procedure Laterality Date  . APPENDECTOMY    . CORONARY ARTERY BYPASS GRAFT  2017   [place]     90's- cervix  . DILATION AND CURETTAGE OF UTERUS  1994  . LAPAROSCOPIC APPENDECTOMY N/A 01/28/2016   Procedure: APPENDECTOMY LAPAROSCOPIC;  Surgeon: Stark Klein, MD;  Location: [place];  Service: General;  Laterality: N/A;  . LEFT HEART CATH AND CORS/GRAFTS ANGIOGRAPHY N/A 06/07/2020   Procedure: LEFT HEART CATH AND CORS/GRAFTS ANGIOGRAPHY;  Surgeon: Martinique, Peter M, MD;  Location: [place] [facility];  Service: Cardiovascular;  Laterality: N/A;  . ROBOTIC ASSISTED SALPINGO OOPHERECTOMY Right 03/24/2016   Procedure: XI ROBOTIC ASSISTED LAPAROSCOPIC RIGHT SALPINGO OOPHORECTOMY;  Surgeon: Everitt Amber, MD;  Location: [facility];  Service: Gynecology;  Laterality: Right;  . TONSILLECTOMY      Current Medications: Current Meds  Medication Sig  . aspirin EC 81 MG tablet Take 1 tablet (81 mg total) by mouth daily. Swallow whole.  Marland Kitchen atorvastatin (LIPITOR) 80 MG tablet Take 1 tablet (80 mg total) by mouth daily.  . busPIRone (BUSPAR) 10 MG tablet Take 20 mg by mouth 2 (two) times daily.  . carvedilol (COREG) 12.5 MG tablet Take 1 tablet (12.5 mg total) by mouth 2 (two) times daily with a meal.  . gabapentin (NEURONTIN) 300 MG capsule Take 300 mg by mouth 3 (three) times daily.  . isosorbide mononitrate (IMDUR) 60 MG 24 hr tablet Take 1 tablet (60 mg total) by mouth daily.  Marland Kitchen losartan (COZAAR) 25 MG tablet Take 1 tablet (  25 mg total) by mouth daily.     Allergies:   Patient has no known allergies.   Social History   Socioeconomic History  . Marital status: Single    Spouse name: Not on file  . Number of children: Not on file  . Years of education: Not on file  . Highest education level: Not on file  Occupational History  . Not on file  Tobacco Use  . Smoking status: Current Every Day Smoker    Packs/day: 0.50    Years: 21.00    Pack years: 10.50    Types: Cigarettes  . Smokeless tobacco: Never Used  Substance and Sexual Activity  . Alcohol use: No     Comment: rare social  . Drug use: No    Comment: x1 episode "Meth" use.  Marland Kitchen Sexual activity: Not Currently    Birth control/protection: None  Other Topics Concern  . Not on file  Social History Narrative  . Not on file   Social Determinants of Health   Financial Resource Strain:   . Difficulty of Paying Living Expenses: Not on file  Food Insecurity:   . Worried About Charity fundraiser in the Last Year: Not on file  . Ran Out of Food in the Last Year: Not on file  Transportation Needs:   . Lack of Transportation (Medical): Not on file  . Lack of Transportation (Non-Medical): Not on file  Physical Activity:   . Days of Exercise per Week: Not on file  . Minutes of Exercise per Session: Not on file  Stress:   . Feeling of Stress : Not on file  Social Connections:   . Frequency of Communication with Friends and Family: Not on file  . Frequency of Social Gatherings with Friends and Family: Not on file  . Attends Religious Services: Not on file  . Active Member of Clubs or Organizations: Not on file  . Attends Archivist Meetings: Not on file  . Marital Status: Not on file     Family History: The patient's family history includes Diabetes in her maternal grandmother and paternal grandfather. ROS:   Please see the history of present illness.    All 14 point review of systems negative except as described per history of present illness  EKGs/Labs/Other Studies Reviewed:      Recent Labs: 06/07/2020: ALT 17; B Natriuretic Peptide 173.4; Hemoglobin 15.4; Platelets 161 06/08/2020: BUN 11; Creatinine, Ser 0.53; Potassium 4.2; Sodium 139  Recent Lipid Panel    Component Value Date/Time   CHOL 113 09/02/2020 0811   TRIG 100 09/02/2020 0811   HDL 32 (L) 09/02/2020 0811   CHOLHDL 3.5 09/02/2020 0811   CHOLHDL 4.9 06/07/2020 1422   VLDL 23 06/07/2020 1422   LDLCALC 62 09/02/2020 0811    Physical Exam:    VS:  There were no vitals taken for this visit.    Wt  Readings from Last 3 Encounters:  08/01/20 220 lb (99.8 kg)  06/27/20 208 lb 12.8 oz (94.7 kg)  06/08/20 215 lb 2.7 oz (97.6 kg)     GEN:  Well nourished, well developed in no acute distress HEENT: Normal NECK: No JVD; No carotid bruits LYMPHATICS: No lymphadenopathy CARDIAC: RRR, no murmurs, no rubs, no gallops RESPIRATORY:  Clear to auscultation without rales, wheezing or rhonchi  ABDOMEN: Soft, non-tender, non-distended MUSCULOSKELETAL:  No edema; No deformity  SKIN: Warm and dry LOWER EXTREMITIES: no swelling NEUROLOGIC:  Alert and oriented x 3 PSYCHIATRIC:  Normal affect   ASSESSMENT:    1. History of coronary artery bypass graft   2. Ischemic cardiomyopathy   3. Primary hypertension   4. Tobacco abuse    PLAN:    In order of problems listed above:  1. Coronary disease status post coronary bypass graft recovering doing well the key is risk factors modification we did have some problems with this she still continues to smoke we will try to give her prescription for nicotine chewing gum to see if that helps she wants Chantix however there have been withdrawn from the market. 2. Ischemic cardiomyopathy last echocardiogram showed ejection fraction 40 to 45%, she is on Cozaar which I will continue, will increase the dose to twice a day, will continue with Coreg.  In the future we will switch her to [facility]. 3. Essential hypertension blood pressure seems still elevated hopefully will be a little increasing dose of losartan to help with that problem. 4. Dyslipidemia: She is taking Lipitor 80 we will schedule her to have fasting blood profile.  Overall it is a challenging situation she does have multiple risk factors and not modified.  We will try to convince her to quit smoking I convince her to check her sugar on the regular basis.  See her back in my office 3 months   Medication Adjustments/Labs and Tests Ordered: Current medicines are reviewed at length with the patient  today.  Concerns regarding medicines are outlined above.  No orders of the defined types were placed in this encounter.  Medication changes: No orders of the defined types were placed in this encounter.   Signed, Park Liter, MD, [facility] 11/01/2020 2:49 PM    [place]

## 2020-11-01 NOTE — Patient Instructions (Signed)
Medication Instructions:  Your physician recommends that you continue on your current medications as directed. Please refer to the Current Medication list given to you today.  *If you need a refill on your cardiac medications before your next appointment, please call your pharmacy*   Lab Work: NONE If you have labs (blood work) drawn today and your tests are completely normal, you will receive your results only by: MyChart Message (if you have MyChart) OR A paper copy in the mail If you have any lab test that is abnormal or we need to change your treatment, we will call you to review the results.   Testing/Procedures: NONE   Follow-Up: At [facility], you and your health needs are our priority.  As part of our continuing mission to provide you with exceptional heart care, we have created designated Provider Care Teams.  These Care Teams include your primary Cardiologist (physician) and Advanced Practice Providers (APPs -  Physician Assistants and Nurse Practitioners) who all work together to provide you with the care you need, when you need it.  We recommend signing up for the patient portal called "MyChart".  Sign up information is provided on this After Visit Summary.  MyChart is used to connect with patients for Virtual Visits (Telemedicine).  Patients are able to view lab/test results, encounter notes, upcoming appointments, etc.  Non-urgent messages can be sent to your provider as well.   To learn more about what you can do with MyChart, go to [email or web address].    Your next appointment:   3 month(s)  The format for your next appointment:   In Person  Provider:   Robert Krasowski, MD    Other Instructions   

## 2021-01-02 ENCOUNTER — Other Ambulatory Visit: Payer: Self-pay | Admitting: Cardiology

## 2021-02-11 ENCOUNTER — Ambulatory Visit (INDEPENDENT_AMBULATORY_CARE_PROVIDER_SITE_OTHER): Payer: PRIVATE HEALTH INSURANCE | Admitting: Cardiology

## 2021-02-11 ENCOUNTER — Other Ambulatory Visit: Payer: Self-pay

## 2021-02-11 ENCOUNTER — Encounter: Payer: Self-pay | Admitting: Cardiology

## 2021-02-11 ENCOUNTER — Other Ambulatory Visit: Payer: Self-pay | Admitting: Cardiology

## 2021-02-11 VITALS — BP 128/70 | HR 71 | Ht 67.0 in | Wt 217.0 lb

## 2021-02-11 DIAGNOSIS — Z951 Presence of aortocoronary bypass graft: Secondary | ICD-10-CM | POA: Diagnosis not present

## 2021-02-11 DIAGNOSIS — I10 Essential (primary) hypertension: Secondary | ICD-10-CM | POA: Diagnosis not present

## 2021-02-11 DIAGNOSIS — E785 Hyperlipidemia, unspecified: Secondary | ICD-10-CM | POA: Diagnosis not present

## 2021-02-11 DIAGNOSIS — I25810 Atherosclerosis of coronary artery bypass graft(s) without angina pectoris: Secondary | ICD-10-CM

## 2021-02-11 MED ORDER — RANOLAZINE ER 500 MG PO TB12
500.0000 mg | ORAL_TABLET | Freq: Two times a day (BID) | ORAL | 2 refills | Status: DC
Start: 2021-02-11 — End: 2021-03-10

## 2021-02-11 NOTE — Addendum Note (Signed)
Addended by: Senaida Ores on: 02/11/2021 04:12 PM   Modules accepted: Orders

## 2021-02-11 NOTE — Progress Notes (Signed)
Cardiology Office Note:    Date:  02/11/2021   ID:  [name], DOB 1976-06-23, MRN [id number]  PCP:  Zoila Shutter, NP  Cardiologist:  Jenne Campus, MD    Referring MD: Zoila Shutter, NP   Chief Complaint  Patient presents with  . Chest Pain  . Leg Swelling    History of Present Illness:    [name] is a 45 y.o. female with past medical history significant for coronary artery disease.  In 2017 she did have coronary bypass graft done with LIMA to LAD, SVG to RCA.  She also got history of essential hypertension, type 2 diabetes, noncompliance, smoking.  In December of last year she ended up going to hospital because of non-STEMI.  Cardiac catheterization done thereafter showed completely occluded graft going to right coronary artery.  LIMA going to LAD was still patent however LAD was completely occluded distally.  Luckily her right coronary artery has quite good collateralization. She comes today to my office for follow-up.  She complained of having some chest pain.  Overall there is a much better does typically happening during the physical work as well as when she gets upset.  Stopping make the sensation goes away.  She described fairly typical angina pectoris.  Past Medical History:  Diagnosis Date  . Abnormal Pap smear   . Abnormal stress test 08/20/2016   Formatting of this note might be different from the original. Added automatically from request for surgery [id number]  . Acute appendicitis 01/28/2016  . Acute appendicitis with localized peritonitis 01/28/2016  . Anemia   . Angina pectoris ([place]) 06/27/2020  . Calculus of gallbladder 06/05/2016  . Cardiomyopathy ([place])    "postpartem cardiomyopathy"- 2012- has since been released by cardiology  . Coronary artery disease 06/27/2020  . COVID 10/14/2020   c/o cough and chest pressure   . Depression   . Diabetes mellitus   . Diabetes mellitus type II 08/22/2011  . Dyslipidemia 06/27/2020  . Dyspnea on exertion 06/27/2020  .  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 LIMA to LAD, SVG to PDA in September 2017 at [facility]  . Hypertension   . Liver abscess    2012- Terrial Rhodes drained placed for awhile.  . Long-term insulin use ([place]) 01/26/2017  . Migraine    past x1 -none since.  . Mixed hyperlipidemia 02/25/2016  . NSTEMI (non-ST elevated myocardial infarction) ([place]) 06/07/2020  . Obesity 09/18/2011  . PCOS (polycystic ovarian syndrome) 02/25/2016  . Persistent microalbuminuria associated with type 2 diabetes mellitus ([place]) 02/25/2016  . Renal failure 09/25/2011   Due to vancomycin, IV contrast, sepsis.    . 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 SIRS due to infectious process with acute organ dysfunction ([place]) 09/18/2011   Multiorgan failure secondary to sepsis after IR drainage of liver abscess.  Was hospitalized, intubated, had cardiopulmonary failure.   . Tobacco abuse   . Type 2 diabetes mellitus with hyperglycemia ([place]) 02/25/2016    Past Surgical History:  Procedure Laterality Date  . APPENDECTOMY    . CORONARY ARTERY BYPASS GRAFT  2017   [place]     90's- cervix  . DILATION AND CURETTAGE OF UTERUS  1994  . LAPAROSCOPIC APPENDECTOMY N/A 01/28/2016   Procedure: APPENDECTOMY LAPAROSCOPIC;  Surgeon: Stark Klein,  MD;  Location: [place];  Service: General;  Laterality: N/A;  . LEFT HEART CATH AND CORS/GRAFTS ANGIOGRAPHY N/A 06/07/2020   Procedure: LEFT HEART CATH AND CORS/GRAFTS ANGIOGRAPHY;  Surgeon: Martinique, Peter M, MD;  Location: [place] [facility];  Service: Cardiovascular;  Laterality: N/A;  . ROBOTIC ASSISTED SALPINGO OOPHERECTOMY Right 03/24/2016   Procedure: XI ROBOTIC ASSISTED LAPAROSCOPIC RIGHT SALPINGO OOPHORECTOMY;  Surgeon: Everitt Amber, MD;  Location: [facility];  Service: Gynecology;  Laterality: Right;  .  TONSILLECTOMY      Current Medications: Current Meds  Medication Sig  . aspirin EC 81 MG tablet Take 1 tablet (81 mg total) by mouth daily. Swallow whole.  Marland Kitchen atorvastatin (LIPITOR) 80 MG tablet Take 1 tablet (80 mg total) by mouth daily.  . busPIRone (BUSPAR) 10 MG tablet Take 10 mg by mouth 2 (two) times daily.  . carvedilol (COREG) 12.5 MG tablet Take 1 tablet (12.5 mg total) by mouth 2 (two) times daily with a meal.  . gabapentin (NEURONTIN) 300 MG capsule Take 300 mg by mouth 3 (three) times daily.  . isosorbide mononitrate (IMDUR) 60 MG 24 hr tablet Take 1 tablet (60 mg total) by mouth daily.  Marland Kitchen losartan (COZAAR) 25 MG tablet TAKE 1 TABLET BY MOUTH EVERY DAY     Allergies:   Patient has no known allergies.   Social History   Socioeconomic History  . Marital status: Single    Spouse name: Not on file  . Number of children: Not on file  . Years of education: Not on file  . Highest education level: Not on file  Occupational History  . Not on file  Tobacco Use  . Smoking status: Current Every Day Smoker    Packs/day: 0.50    Years: 21.00    Pack years: 10.50    Types: Cigarettes  . Smokeless tobacco: Never Used  Substance and Sexual Activity  . Alcohol use: No    Comment: rare social  . Drug use: No    Comment: x1 episode "Meth" use.  Marland Kitchen Sexual activity: Not Currently    Birth control/protection: None  Other Topics Concern  . Not on file  Social History Narrative  . Not on file   Social Determinants of Health   Financial Resource Strain: Not on file  Food Insecurity: Not on file  Transportation Needs: Not on file  Physical Activity: Not on file  Stress: Not on file  Social Connections: Not on file     Family History: The patient's family history includes Diabetes in her maternal grandmother and paternal grandfather. ROS:   Please see the history of present illness.    All 14 point review of systems negative except as described per history of present  illness  EKGs/Labs/Other Studies Reviewed:      Recent Labs: 06/07/2020: ALT 17; B Natriuretic Peptide 173.4; Hemoglobin 15.4; Platelets 161 06/08/2020: BUN 11; Creatinine, Ser 0.53; Potassium 4.2; Sodium 139  Recent Lipid Panel    Component Value Date/Time   CHOL 113 09/02/2020 0811   TRIG 100 09/02/2020 0811   HDL 32 (L) 09/02/2020 0811   CHOLHDL 3.5 09/02/2020 0811   CHOLHDL 4.9 06/07/2020 1422   VLDL 23 06/07/2020 1422   LDLCALC 62 09/02/2020 0811    Physical Exam:    VS:  There were no vitals taken for this visit.    Wt Readings from Last 3 Encounters:  08/01/20 220 lb (99.8 kg)  06/27/20 208 lb 12.8 oz (94.7 kg)  06/08/20 215  lb 2.7 oz (97.6 kg)     GEN:  Well nourished, well developed in no acute distress HEENT: Normal NECK: No JVD; No carotid bruits LYMPHATICS: No lymphadenopathy CARDIAC: RRR, no murmurs, no rubs, no gallops RESPIRATORY:  Clear to auscultation without rales, wheezing or rhonchi  ABDOMEN: Soft, non-tender, non-distended MUSCULOSKELETAL:  No edema; No deformity  SKIN: Warm and dry LOWER EXTREMITIES: Minimal swelling NEUROLOGIC:  Alert and oriented x 3 PSYCHIATRIC:  Normal affect   ASSESSMENT:    1. History of coronary artery bypass graft   2. Dyslipidemia   3. Coronary artery disease involving coronary bypass graft of native heart without angina pectoris   4. Primary hypertension    PLAN:    In order of problems listed above:  1. Coronary artery disease status post coronary bypass graft, last cardiac catheterization in the summer of last year showed completely occluded graft going to right coronary artery with collateralization of this artery.  LIMA to LAD is patent and open however distal LAD is completely occluded.  I think he remembers to maximize medical therapy.  I will add ranolazine 500 mg twice daily to her medical regimen.  We will continue rest of the medication which include antianginal medication in form of Imdur 60 mg daily as  well as Coreg. 2. Dyspnea on exertion with some swelling of lower extremities.  I will check proBNP as well as Chem-7 today if Chem-7 is fine will initiate small dose of diuretic probably 20 mg of furosemide should be appropriate. 3. Essential hypertension blood pressure seems to be well controlled today we will continue present management. 4. Dyslipidemia, she did have fasting lipid profile done in September 02, 2020 according to K PN which I reviewed showing LDL 62 HDL 32.  She is taking Lipitor 80 which I will continue. 5. Type 2 diabetes.  Her hemoglobin A1c from 2021 is 9.6 I told her she must take care of her diabetes better.  She is being followed by primary care physician. 6. Smoking obviously still a problem we spent at least 5 minutes and I strongly recommended to quit.   Medication Adjustments/Labs and Tests Ordered: Current medicines are reviewed at length with the patient today.  Concerns regarding medicines are outlined above.  No orders of the defined types were placed in this encounter.  Medication changes: No orders of the defined types were placed in this encounter.   Signed, Park Liter, MD, [facility] 02/11/2021 3:51 PM    [facility]

## 2021-02-11 NOTE — Patient Instructions (Signed)
Medication Instructions:  Your physician has recommended you make the following change in your medication:   START: Ranexa 500 mg twice daily   *If you need a refill on your cardiac medications before your next appointment, please call your pharmacy*   Lab Work: Your physician recommends that you return for lab work today: bmp   If you have labs (blood work) drawn today and your tests are completely normal, you will receive your results only by: Marland Kitchen MyChart Message (if you have MyChart) OR . A paper copy in the mail If you have any lab test that is abnormal or we need to change your treatment, we will call you to review the results.   Testing/Procedures: None.   Follow-Up: At [facility], you and your health needs are our priority.  As part of our continuing mission to provide you with exceptional heart care, we have created designated Provider Care Teams.  These Care Teams include your primary Cardiologist (physician) and Advanced Practice Providers (APPs -  Physician Assistants and Nurse Practitioners) who all work together to provide you with the care you need, when you need it.  We recommend signing up for the patient portal called "MyChart".  Sign up information is provided on this After Visit Summary.  MyChart is used to connect with patients for Virtual Visits (Telemedicine).  Patients are able to view lab/test results, encounter notes, upcoming appointments, etc.  Non-urgent messages can be sent to your provider as well.   To learn more about what you can do with MyChart, go to [email or web address].    Your next appointment:   3 month(s)  The format for your next appointment:   In Person  Provider:   Jenne Campus, MD   Other Instructions  Ranolazine tablets, extended release What is this medicine? RANOLAZINE (ra NOE la zeen) is a heart medicine. It is used to treat chronic chest pain (angina). This medicine must be taken regularly. It will not relieve an acute  episode of chest pain. This medicine may be used for other purposes; ask your health care provider or pharmacist if you have questions. COMMON BRAND NAME(S): Ranexa What should I tell my health care provider before I take this medicine? They need to know if you have any of these conditions:  heart disease  irregular heartbeat  kidney disease  liver disease  low levels of potassium or magnesium in the blood  an unusual or allergic reaction to ranolazine, other medicines, foods, dyes, or preservatives  pregnant or trying to get pregnant  breast-feeding How should I use this medicine? Take this medicine by mouth with a glass of water. Follow the directions on the prescription label. Do not cut, crush, or chew this medicine. Take with or without food. Do not take this medication with grapefruit juice. Take your doses at regular intervals. Do not take your medicine more often then directed. Talk to your pediatrician regarding the use of this medicine in children. Special care may be needed. Overdosage: If you think you have taken too much of this medicine contact a poison control center or emergency room at once. NOTE: This medicine is only for you. Do not share this medicine with others. What if I miss a dose? If you miss a dose, take it as soon as you can. If it is almost time for your next dose, take only that dose. Do not take double or extra doses. What may interact with this medicine? Do not take this medicine with any of  the following medications:  antivirals for HIV or AIDS  cerivastatin  certain antibiotics like chloramphenicol, clarithromycin, dalfopristin; quinupristin, isoniazid, rifabutin, rifampin, rifapentine  certain medicines used for cancer like imatinib, nilotinib  certain medicines for fungal infections like fluconazole, itraconazole, ketoconazole, posaconazole, voriconazole  certain medicines for irregular heart beat like dronedarone  certain medicines for  seizures like carbamazepine, fosphenytoin, oxcarbazepine, phenobarbital, phenytoin  cisapride  conivaptan  cyclosporine  grapefruit or grapefruit juice  lumacaftor; ivacaftor  nefazodone  pimozide  quinacrine  St John's wort  thioridazine This medicine may also interact with the following medications:  alfuzosin  certain medicines for depression, anxiety, or psychotic disturbances like bupropion, citalopram, fluoxetine, fluphenazine, paroxetine, perphenazine, risperidone, sertraline, trifluoperazine  certain medicines for cholesterol like atorvastatin, lovastatin, simvastatin  certain medicines for stomach problems like octreotide, palonosetron, prochlorperazine  eplerenone  ergot alkaloids like dihydroergotamine, ergonovine, ergotamine, methylergonovine  metformin  nicardipine  other medicines that prolong the QT interval (cause an abnormal heart rhythm) like dofetilide, ziprasidone  sirolimus  tacrolimus This list may not describe all possible interactions. Give your health care provider a list of all the medicines, herbs, non-prescription drugs, or dietary supplements you use. Also tell them if you smoke, drink alcohol, or use illegal drugs. Some items may interact with your medicine. What should I watch for while using this medicine? Visit your doctor for regular check ups. Tell your doctor or healthcare professional if your symptoms do not start to get better or if they get worse. This medicine will not relieve an acute attack of angina or chest pain. This medicine can change your heart rhythm. Your health care provider may check your heart rhythm by ordering an electrocardiogram (ECG) while you are taking this medicine. You may get drowsy or dizzy. Do not drive, use machinery, or do anything that needs mental alertness until you know how this medicine affects you. Do not stand or sit up quickly, especially if you are an older patient. This reduces the risk of  dizzy or fainting spells. Alcohol may interfere with the effect of this medicine. Avoid alcoholic drinks. If you are scheduled for any medical or dental procedure, tell your healthcare provider that you are taking this medicine. This medicine can interact with other medicines used during surgery. What side effects may I notice from receiving this medicine? Side effects that you should report to your doctor or health care professional as soon as possible:  allergic reactions like skin rash, itching or hives, swelling of the face, lips, or tongue  breathing problems  changes in vision  fast, irregular or pounding heartbeat  feeling faint or lightheaded, falls  low or high blood pressure  numbness or tingling feelings  ringing in the ears  tremor or shakiness  slow heartbeat (fewer than 50 beats per minute)  swelling of the legs or feet Side effects that usually do not require medical attention (report to your doctor or health care professional if they continue or are bothersome):  constipation  drowsy  dry mouth  headache  nausea or vomiting  stomach upset This list may not describe all possible side effects. Call your doctor for medical advice about side effects. You may report side effects to FDA at 1-800-FDA-1088. Where should I keep my medicine? Keep out of the reach of children. Store at room temperature between 15 and 30 degrees C (59 and 86 degrees F). Throw away any unused medicine after the expiration date. NOTE: This sheet is a summary. It may not cover all  possible information. If you have questions about this medicine, talk to your doctor, pharmacist, or health care provider.  2021 Elsevier/Gold Standard (2018-11-29 09:18:49)

## 2021-02-12 LAB — BASIC METABOLIC PANEL
BUN/Creatinine Ratio: 17 (ref 9–23)
BUN: 12 mg/dL (ref 6–24)
CO2: 21 mmol/L (ref 20–29)
Calcium: 9.2 mg/dL (ref 8.7–10.2)
Chloride: 94 mmol/L — ABNORMAL LOW (ref 96–106)
Creatinine, Ser: 0.7 mg/dL (ref 0.57–1.00)
GFR calc Af Amer: 120 mL/min/{1.73_m2} (ref >59)
GFR calc non Af Amer: 104 mL/min/{1.73_m2} (ref >59)
Glucose: 469 mg/dL — ABNORMAL HIGH (ref 65–99)
Potassium: 4.7 mmol/L (ref 3.5–5.2)
Sodium: 132 mmol/L — ABNORMAL LOW (ref 134–144)

## 2021-02-12 NOTE — Telephone Encounter (Signed)
Refill sent to pharmacy.   

## 2021-02-14 ENCOUNTER — Telehealth: Payer: Self-pay | Admitting: Emergency Medicine

## 2021-02-14 DIAGNOSIS — Z79899 Other long term (current) drug therapy: Secondary | ICD-10-CM

## 2021-02-14 MED ORDER — FUROSEMIDE 20 MG PO TABS
20.0000 mg | ORAL_TABLET | Freq: Every day | ORAL | 1 refills | Status: DC
Start: 2021-02-14 — End: 2021-04-02

## 2021-02-14 NOTE — Addendum Note (Signed)
Addended by: Senaida Ores on: 02/14/2021 04:17 PM   Modules accepted: Orders

## 2021-02-14 NOTE — Telephone Encounter (Signed)
Start lasix 20 mg po qd, chem7 in 1 week

## 2021-02-14 NOTE — Telephone Encounter (Signed)
Called patient informed her to start lasix 20 mg daily and repeat labs in 1 week. She understood no further questions.

## 2021-02-14 NOTE — Telephone Encounter (Signed)
-----   Message from Park Liter, MD sent at 02/14/2021 10:36 AM EST ----- Labs are looking good but glucose is very elevated.  She did talk to her primary care physician to improvement in her diabetes control

## 2021-02-14 NOTE — Telephone Encounter (Signed)
Patient informed of results. She said Dr. Agustin Cree was checking this to see if she could start fluid pills. Will consult with him since no mention in result note.

## 2021-02-24 LAB — BASIC METABOLIC PANEL
BUN/Creatinine Ratio: 25 — ABNORMAL HIGH (ref 9–23)
BUN: 15 mg/dL (ref 6–24)
CO2: 18 mmol/L — ABNORMAL LOW (ref 20–29)
Calcium: 9 mg/dL (ref 8.7–10.2)
Chloride: 102 mmol/L (ref 96–106)
Creatinine, Ser: 0.6 mg/dL (ref 0.57–1.00)
Glucose: 277 mg/dL — ABNORMAL HIGH (ref 65–99)
Potassium: 4.6 mmol/L (ref 3.5–5.2)
Sodium: 135 mmol/L (ref 134–144)
eGFR: 112 mL/min/{1.73_m2} (ref >59)

## 2021-02-27 ENCOUNTER — Telehealth: Payer: Self-pay

## 2021-02-27 NOTE — Telephone Encounter (Signed)
-----   Message from Park Liter, MD sent at 02/26/2021 12:51 PM EST ----- Chem-7 looks good, continue present management

## 2021-02-27 NOTE — Telephone Encounter (Signed)
Patient notified of test results 

## 2021-03-09 ENCOUNTER — Other Ambulatory Visit: Payer: Self-pay | Admitting: Cardiology

## 2021-03-15 ENCOUNTER — Encounter (HOSPITAL_COMMUNITY): Payer: Self-pay | Admitting: Emergency Medicine

## 2021-03-15 ENCOUNTER — Emergency Department (HOSPITAL_COMMUNITY)
Admission: EM | Admit: 2021-03-15 | Discharge: 2021-03-15 | Disposition: A | Discharge status: Home / Self Care | Payer: PRIVATE HEALTH INSURANCE | Attending: Emergency Medicine | Admitting: Emergency Medicine

## 2021-03-15 ENCOUNTER — Emergency Department (HOSPITAL_COMMUNITY): Payer: PRIVATE HEALTH INSURANCE

## 2021-03-15 ENCOUNTER — Other Ambulatory Visit: Payer: Self-pay

## 2021-03-15 DIAGNOSIS — R42 Dizziness and giddiness: Secondary | ICD-10-CM | POA: Diagnosis not present

## 2021-03-15 DIAGNOSIS — I2510 Atherosclerotic heart disease of native coronary artery without angina pectoris: Secondary | ICD-10-CM | POA: Diagnosis not present

## 2021-03-15 DIAGNOSIS — R079 Chest pain, unspecified: Secondary | ICD-10-CM | POA: Insufficient documentation

## 2021-03-15 DIAGNOSIS — I10 Essential (primary) hypertension: Secondary | ICD-10-CM | POA: Insufficient documentation

## 2021-03-15 DIAGNOSIS — Z951 Presence of aortocoronary bypass graft: Secondary | ICD-10-CM | POA: Insufficient documentation

## 2021-03-15 DIAGNOSIS — Z79899 Other long term (current) drug therapy: Secondary | ICD-10-CM | POA: Insufficient documentation

## 2021-03-15 DIAGNOSIS — R072 Precordial pain: Secondary | ICD-10-CM | POA: Diagnosis not present

## 2021-03-15 DIAGNOSIS — E119 Type 2 diabetes mellitus without complications: Secondary | ICD-10-CM | POA: Diagnosis not present

## 2021-03-15 DIAGNOSIS — F17210 Nicotine dependence, cigarettes, uncomplicated: Secondary | ICD-10-CM | POA: Diagnosis not present

## 2021-03-15 DIAGNOSIS — R0602 Shortness of breath: Secondary | ICD-10-CM | POA: Insufficient documentation

## 2021-03-15 DIAGNOSIS — Z8616 Personal history of COVID-19: Secondary | ICD-10-CM | POA: Diagnosis not present

## 2021-03-15 DIAGNOSIS — Z7982 Long term (current) use of aspirin: Secondary | ICD-10-CM | POA: Diagnosis not present

## 2021-03-15 LAB — COMPREHENSIVE METABOLIC PANEL
ALT: 22 U/L (ref 0–44)
AST: 18 U/L (ref 15–41)
Albumin: 3.8 g/dL (ref 3.5–5.0)
Alkaline Phosphatase: 50 U/L (ref 38–126)
Anion gap: 6 (ref 5–15)
BUN: 12 mg/dL (ref 6–20)
CO2: 27 mmol/L (ref 22–32)
Calcium: 9.2 mg/dL (ref 8.9–10.3)
Chloride: 101 mmol/L (ref 98–111)
Creatinine, Ser: 0.7 mg/dL (ref 0.44–1.00)
GFR, Estimated: >60 mL/min (ref >60)
Glucose, Bld: 297 mg/dL — ABNORMAL HIGH (ref 70–99)
Potassium: 4.2 mmol/L (ref 3.5–5.1)
Sodium: 134 mmol/L — ABNORMAL LOW (ref 135–145)
Total Bilirubin: 0.9 mg/dL (ref 0.3–1.2)
Total Protein: 6.7 g/dL (ref 6.5–8.1)

## 2021-03-15 LAB — I-STAT BETA HCG BLOOD, ED (MC, WL, AP ONLY): I-stat hCG, quantitative: <5 m[IU]/mL (ref <5)

## 2021-03-15 LAB — CBC
HCT: 49.4 % — ABNORMAL HIGH (ref 36.0–46.0)
Hemoglobin: 15.7 g/dL — ABNORMAL HIGH (ref 12.0–15.0)
MCH: 27.6 pg (ref 26.0–34.0)
MCHC: 31.8 g/dL (ref 30.0–36.0)
MCV: 87 fL (ref 80.0–100.0)
Platelets: 165 10*3/uL (ref 150–400)
RBC: 5.68 MIL/uL — ABNORMAL HIGH (ref 3.87–5.11)
RDW: 14.8 % (ref 11.5–15.5)
WBC: 14.8 10*3/uL — ABNORMAL HIGH (ref 4.0–10.5)
nRBC: 0 % (ref 0.0–0.2)

## 2021-03-15 LAB — BRAIN NATRIURETIC PEPTIDE: B Natriuretic Peptide: 113.5 pg/mL — ABNORMAL HIGH (ref 0.0–100.0)

## 2021-03-15 LAB — TROPONIN I (HIGH SENSITIVITY)
Troponin I (High Sensitivity): 8 ng/L (ref <18)
Troponin I (High Sensitivity): 9 ng/L (ref <18)
Troponin I (High Sensitivity): 7 ng/L (ref <18)

## 2021-03-15 MED ORDER — ASPIRIN 81 MG PO CHEW
324.0000 mg | CHEWABLE_TABLET | Freq: Once | ORAL | Status: AC
  Administered 2021-03-15: 324 mg via ORAL
  Filled 2021-03-15: qty 4

## 2021-03-15 NOTE — ED Notes (Signed)
Pt transported to xray 

## 2021-03-15 NOTE — Consult Note (Signed)
Cardiology Admission History and Physical:   Patient ID: [name] MRN: [id number]; DOB: 1976-04-21   Admission date: 03/15/2021  PCP:  Zoila Shutter, NP   [place]  Cardiologist:  Dr Posey Rea [id number]  Chief Complaint:  Chest pain  Patient Profile:   45 year old female with past medical history of diabetes mellitus, hypertension, hyperlipidemia, coronary artery disease, tobacco abuse, polycystic ovarian syndrome with chest pain.  History of Present Illness:   Patient is status post coronary artery bypass and graft in 2017 with a LIMA to the LAD, saphenous vein graft to the right coronary artery.  Cardiac catheterization June 2021 showed occluded LAD, 70% marginal, occluded right coronary artery.  Saphenous vein graft to the right coronary artery was occluded and LIMA to the LAD was patent.  RCA was noted to have some collaterals. Medical therapy recommended. Echocardiogram June 2021 showed ejection fraction 40 to 45%, mild left ventricular enlargement, mild left ventricular hypertrophy, grade 2 diastolic dysfunction, moderate left atrial enlargement, mild mitral regurgitation.  Patient has had occasional chest pain at home for which she has been treated medically.  Ranexa was added at last office visit on February 22.  She was at work this morning and suddenly felt dizzy by her report.  She developed a "uncomfortable feeling" in her upper mid chest.  It was unlike her previous infarct pain.  No radiation.  No associated nausea, dyspnea or diaphoresis.  The pain was not pleuritic or positional.  It persisted and she presented to the emergency room for evaluation.  Improved with aspirin.  Total duration approximately 4 hours.  Cardiology asked to evaluate.  She has some dyspnea on exertion but no orthopnea, PND or pedal edema.  No recent syncope.   Past Medical History:  Diagnosis Date  . Abnormal Pap smear   . Abnormal stress test 08/20/2016   Formatting  of this note might be different from the original. Added automatically from request for surgery [id number]  . Acute appendicitis 01/28/2016  . Acute appendicitis with localized peritonitis 01/28/2016  . Anemia   . Angina pectoris ([place]) 06/27/2020  . Calculus of gallbladder 06/05/2016  . Cardiomyopathy ([place])    "postpartem cardiomyopathy"- 2012- has since been released by cardiology  . Coronary artery disease 06/27/2020  . COVID 10/14/2020   c/o cough and chest pressure   . Depression   . Diabetes mellitus type II 08/22/2011  . Dyslipidemia 06/27/2020  . 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 LIMA to LAD, SVG to PDA in September 2017 at [facility]  . Hypertension   . Liver abscess    2012- Terrial Rhodes drained placed for awhile.  . Long-term insulin use ([place]) 01/26/2017  . Migraine    past x1 -none since.  . Mixed hyperlipidemia 02/25/2016  . NSTEMI (non-ST elevated myocardial infarction) ([place]) 06/07/2020  . Obesity 09/18/2011  . PCOS (polycystic ovarian syndrome) 02/25/2016  . Persistent microalbuminuria associated with type 2 diabetes mellitus ([place]) 02/25/2016  . Renal failure 09/25/2011   Due to vancomycin, IV contrast, sepsis.    . 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 SIRS due to infectious process with acute organ dysfunction ([place]) 09/18/2011   Multiorgan failure secondary to sepsis after IR drainage of liver abscess.  Was hospitalized, intubated, had cardiopulmonary failure.   . Tobacco abuse   .  Type 2 diabetes mellitus with hyperglycemia ([place]) 02/25/2016    Past Surgical History:  Procedure Laterality Date  . APPENDECTOMY    . CORONARY ARTERY BYPASS GRAFT  2017   [place]     90's- cervix  . DILATION AND CURETTAGE OF UTERUS  1994  . LAPAROSCOPIC APPENDECTOMY N/A 01/28/2016   Procedure:  APPENDECTOMY LAPAROSCOPIC;  Surgeon: Stark Klein, MD;  Location: [facility];  Service: General;  Laterality: N/A;  . LEFT HEART CATH AND CORS/GRAFTS ANGIOGRAPHY N/A 06/07/2020   Procedure: LEFT HEART CATH AND CORS/GRAFTS ANGIOGRAPHY;  Surgeon: Martinique, Peter M, MD;  Location: [facility];  Service: Cardiovascular;  Laterality: N/A;  . ROBOTIC ASSISTED SALPINGO OOPHERECTOMY Right 03/24/2016   Procedure: XI ROBOTIC ASSISTED LAPAROSCOPIC RIGHT SALPINGO OOPHORECTOMY;  Surgeon: Everitt Amber, MD;  Location: [facility];  Service: Gynecology;  Laterality: Right;  . TONSILLECTOMY       Medications Prior to Admission: Prior to Admission medications   Medication Sig Start Date End Date Taking? Authorizing Provider  aspirin EC 81 MG tablet Take 1 tablet (81 mg total) by mouth daily. Swallow whole. 06/27/20  Yes Park Liter, MD  atorvastatin (LIPITOR) 80 MG tablet Take 1 tablet (80 mg total) by mouth daily. 06/09/20  Yes Seawell, Jaimie A, DO  busPIRone (BUSPAR) 10 MG tablet Take 10 mg by mouth in the morning and at bedtime.   Yes [provider]  carvedilol (COREG) 12.5 MG tablet Take 1 tablet (12.5 mg total) by mouth 2 (two) times daily with a meal. Patient taking differently: Take 12.5 mg by mouth in the morning and at bedtime. 08/13/20  Yes Park Liter, MD  furosemide (LASIX) 20 MG tablet Take 1 tablet (20 mg total) by mouth daily. 02/14/21 05/15/21 Yes Park Liter, MD  gabapentin (NEURONTIN) 300 MG capsule Take 300-600 mg by mouth See admin instructions. Take 300 mg by mouth in the morning and 600 mg at bedtime   Yes [provider]  isosorbide mononitrate (IMDUR) 60 MG 24 hr tablet TAKE 1 TABLET BY MOUTH EVERY DAY Patient taking differently: Take 60 mg by mouth in the morning. 02/12/21  Yes Park Liter, MD  losartan (COZAAR) 25 MG tablet TAKE 1 TABLET BY MOUTH EVERY DAY Patient taking differently: Take 25 mg by mouth in the morning. 01/02/21  Yes Park Liter,  MD  Multiple Vitamins-Minerals (ONE-A-DAY WOMENS PO) Take 1 tablet by mouth daily with breakfast.   Yes [provider]  ranolazine (RANEXA) 500 MG 12 hr tablet TAKE 1 TABLET BY MOUTH TWICE A DAY Patient taking differently: Take 500 mg by mouth in the morning and at bedtime. 03/10/21  Yes Park Liter, MD     Allergies:   No Known Allergies  Social History:   Social History   Socioeconomic History  . Marital status: Single    Spouse name: Not on file  . Number of children: Not on file  . Years of education: Not on file  . Highest education level: Not on file  Occupational History  . Not on file  Tobacco Use  . Smoking status: Current Every Day Smoker    Packs/day: 0.50    Years: 21.00    Pack years: 10.50    Types: Cigarettes  . Smokeless tobacco: Never Used  Substance and Sexual Activity  . Alcohol use: Yes    Comment: rare social  . Drug use: No    Comment: x1 episode "  Meth" use.  Marland Kitchen Sexual activity: Not Currently    Birth control/protection: None  Other Topics Concern  . Not on file  Social History Narrative  . Not on file   Social Determinants of Health   Financial Resource Strain: Not on file  Food Insecurity: Not on file  Transportation Needs: Not on file  Physical Activity: Not on file  Stress: Not on file  Social Connections: Not on file  Intimate Partner Violence: Not on file    Family History:   The patient's family history includes CAD in her father and mother; Diabetes in her maternal grandmother and paternal grandfather.    ROS:  Please see the history of present illness.  No fevers, chills, productive cough or hemoptysis.  All other ROS reviewed and negative.     Physical Exam/Data:   Vitals:   03/15/21 1400 03/15/21 1430 03/15/21 1445 03/15/21 1500  BP: 118/67 103/63 135/72 112/66  Pulse: 73 70 73 74  Resp: (!) 29 19 15  (!) 21  Temp:      SpO2: 97% 95% 98% 97%  Weight:      Height:       No intake or output data in the 24  hours ending 03/15/21 1507 Last 3 Weights 03/15/2021 02/11/2021 08/01/2020  Weight (lbs) 216 lb 14.9 oz 217 lb 220 lb  Weight (kg) 98.4 kg 98.431 kg 99.791 kg     Body mass index is 33.98 kg/m.  General:  Well nourished, well developed, in no acute distress HEENT: normal Lymph: no adenopathy Neck: no JVD Endocrine:  No thryomegaly Vascular: No carotid bruits; FA pulses 2+ bilaterally without bruits  Cardiac:  normal S1, S2; RRR; no murmur  Lungs:  clear to auscultation bilaterally, no wheezing, rhonchi or rales; she is status post sternotomy. Abd: soft, nontender, no hepatomegaly  Ext: no edema Musculoskeletal:  No deformities, BUE and BLE strength normal and equal Skin: warm and dry  Neuro:  CNs 2-12 intact, no focal abnormalities noted Psych:  Normal affect    EKG:  The ECG that was done 03/15/21 was personally reviewed and demonstrates sinus rhythm with occasional PVC, cannot rule out septal infarct, lateral T wave inversion.  Unchanged compared to February 11, 2021.   Laboratory Data:  High Sensitivity Troponin:   Recent Labs  Lab 03/15/21 1040 03/15/21 1349  TROPONINIHS 8 9      Chemistry Recent Labs  Lab 03/15/21 1040  NA 134*  K 4.2  CL 101  CO2 27  GLUCOSE 297*  BUN 12  CREATININE 0.70  CALCIUM 9.2  GFRNONAA >60  ANIONGAP 6    Recent Labs  Lab 03/15/21 1040  PROT 6.7  ALBUMIN 3.8  AST 18  ALT 22  ALKPHOS 50  BILITOT 0.9   Hematology Recent Labs  Lab 03/15/21 1040  WBC 14.8*  RBC 5.68*  HGB 15.7*  HCT 49.4*  MCV 87.0  MCH 27.6  MCHC 31.8  RDW 14.8  PLT 165   BNP Recent Labs  Lab 03/15/21 1040  BNP 113.5*     Radiology/Studies:  DG Chest 2 View  Result Date: 03/15/2021 CLINICAL DATA:  Chest pain and dizziness EXAM: CHEST - 2 VIEW COMPARISON:  06/07/2020 FINDINGS: Low volume chest which is unchanged. There is no edema, consolidation, effusion, or pneumothorax. Chronic mild interstitial coarsening with right middle lobe scar. History  of CABG. Normal heart size and stable mediastinal contours. Thoracic kyphosis. IMPRESSION: Stable exam.  No acute finding. Electronically Signed   By:  Monte Fantasia M.D.   On: 03/15/2021 10:31     Assessment and Plan:   1. Chest pain-symptoms are atypical.  She states it is unlike her infarct pain but more of a "uncomfortable feeling".  Duration was 4 hours and initial troponins are normal.  Electrocardiogram unchanged compared to previous.  Would repeat troponin.  If normal she has essentially ruled out and she can be discharged to follow-up with Dr. Agustin Cree. 2. Coronary artery disease-continue aspirin, Lipitor.  Continue antianginals including carvedilol, isosorbide and ranolazine. 3. Ischemic cardiomyopathy-continue carvedilol and losartan. 4. Hyperlipidemia-continue statin. 5. Hypertension-blood pressure controlled.  Continue present medications. 6. Tobacco abuse-patient counseled on discontinuing.  We will arrange follow-up with Dr. Agustin Cree in the next 2 to 4 weeks.  If follow-up troponin is normal she can be discharged on preadmission medications.  For questions or updates, please contact [place] Please consult [email or web address] for contact info under     Signed, Kirk Ruths, MD  03/15/2021 3:07 PM

## 2021-03-15 NOTE — ED Notes (Signed)
Reviewed discharge instructions with patient. Follow-up care reviewed. Patient verbalized understanding. Patient A&Ox4, VSS, and ambulatory with steady gait upon discharge.  

## 2021-03-15 NOTE — ED Triage Notes (Signed)
Reports dizziness, facial swelling, chest heaviness, and SOB since this morning.

## 2021-03-15 NOTE — ED Provider Notes (Signed)
[facility] EMERGENCY DEPARTMENT Provider Note   CSN: [id number] Arrival date & time: 03/15/21  1007     History Chief Complaint  Patient presents with  . Chest Pain  . Shortness of Breath  . Dizziness    [name] is a 45 y.o. female with friend past medical history significant for CAD s/p CABG in 2017, hypertension, type 2 diabetes, noncompliance, smoking with recent non-STEMI 2 years ago.  Patient states that today she started having chest pain, shortness of breath, dizzinessthat began this morning.  Patient states that she was feeling fine when she got up this morning, went to work and was walking started having some dizziness with some tunneling of vision and chest pain with some shortness of breath.  Patient states that chest pain felt as if someone is sitting on her chest, has been starting to resolve while she has been laying in the bed.  States that she still feels slightly short of breath.  Chest pain does not radiate anywhere.  Denies any neck pain or arm pain, denies any vision changes or vision pain or vision loss currently.  Patient states that when this occurred she started having nausea.  Denies any diaphoresis.  States that this does not feel like her prior heart attack, feels less intense however symptoms were similar.  Denies any new cough, patient smokes about a pack of cigarettes a day, denies any fevers or URI symptoms.  Patient also states that she has some fullness feeling in her face, unable to completely describe this, feels like there is a tingling sensation throughout her face, bilaterally.  Denies any headache.  Patient states that she is been compliant with all of her medications except for her insulin, has not been taking her insulin for the past 4 months due to financial reasons.  Is on aspirin, no other anticoagulants.  Denies any leg swelling or weight changes.  Patient follows Dr. Agustin Cree, cardiology.  Per chart review last cardiac cath  revealed three-vessel obstructive CAD with 100% proximal occlusion of LAD, 100% proximal occlusion of RCA and 70% mid to distal OM 2, plans on optical medical therapy since patient does have some collaterals.  HPI     Past Medical History:  Diagnosis Date  . Abnormal Pap smear   . Abnormal stress test 08/20/2016   Formatting of this note might be different from the original. Added automatically from request for surgery [id number]  . Acute appendicitis 01/28/2016  . Acute appendicitis with localized peritonitis 01/28/2016  . Anemia   . Angina pectoris ([place]) 06/27/2020  . Calculus of gallbladder 06/05/2016  . Cardiomyopathy ([place])    "postpartem cardiomyopathy"- 2012- has since been released by cardiology  . Coronary artery disease 06/27/2020  . COVID 10/14/2020   c/o cough and chest pressure   . Depression   . Diabetes mellitus type II 08/22/2011  . Dyslipidemia 06/27/2020  . 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 LIMA to LAD, SVG to PDA in September 2017 at [facility]  . Hypertension   . Liver abscess    2012- Terrial Rhodes drained placed for awhile.  . Long-term insulin use ([place]) 01/26/2017  . Migraine    past x1 -none since.  . Mixed hyperlipidemia 02/25/2016  . NSTEMI (non-ST elevated myocardial infarction) ([place]) 06/07/2020  . Obesity 09/18/2011  . PCOS (polycystic ovarian syndrome) 02/25/2016  . Persistent microalbuminuria associated with type 2 diabetes  mellitus ([place]) 02/25/2016  . Renal failure 09/25/2011   Due to vancomycin, IV contrast, sepsis.    . 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 SIRS due to infectious process with acute organ dysfunction ([place]) 09/18/2011   Multiorgan failure secondary to sepsis after IR drainage of liver abscess.  Was hospitalized, intubated, had cardiopulmonary failure.   . Tobacco abuse   . Type 2  diabetes mellitus with hyperglycemia ([place]) 02/25/2016    Patient Active Problem List   Diagnosis Date Noted  . COVID 10/14/2020  . Tobacco abuse   . Hypertension   . Depression   . Cardiomyopathy ([place])   . Anemia   . Coronary artery disease 06/27/2020  . Angina pectoris ([place]) 06/27/2020  . Dyspnea on exertion 06/27/2020  . Dyslipidemia 06/27/2020  . NSTEMI (non-ST elevated myocardial infarction) ([place]) 06/07/2020  . Long-term insulin use ([place]) 01/26/2017  . History of coronary artery bypass graft 09/22/2016  . Abnormal stress test 08/20/2016  . Calculus of gallbladder 06/05/2016  . Mixed hyperlipidemia 02/25/2016  . PCOS (polycystic ovarian syndrome) 02/25/2016  . Persistent microalbuminuria associated with type 2 diabetes mellitus ([place]) 02/25/2016  . Type 2 diabetes mellitus with hyperglycemia ([place]) 02/25/2016  . Acute appendicitis 01/28/2016  . Acute appendicitis with localized peritonitis 01/28/2016  . Renal failure 09/25/2011  . SIRS due to infectious process with acute organ dysfunction ([place]) 09/18/2011  . Obesity 09/18/2011  . Diabetes mellitus type II 08/22/2011  . Liver abscess 08/20/2011  . 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 APPENDECTOMY    . CORONARY ARTERY BYPASS GRAFT  2017   [place]     90's- cervix  . DILATION AND CURETTAGE OF UTERUS  1994  . LAPAROSCOPIC APPENDECTOMY N/A 01/28/2016   Procedure: APPENDECTOMY LAPAROSCOPIC;  Surgeon: Stark Klein, MD;  Location: [facility];  Service: General;  Laterality: N/A;  . LEFT HEART CATH AND CORS/GRAFTS ANGIOGRAPHY N/A 06/07/2020   Procedure: LEFT HEART CATH AND CORS/GRAFTS ANGIOGRAPHY;  Surgeon: Martinique, Peter M, MD;  Location: [facility];  Service: Cardiovascular;  Laterality: N/A;  . ROBOTIC ASSISTED SALPINGO OOPHERECTOMY Right 03/24/2016   Procedure: XI ROBOTIC ASSISTED LAPAROSCOPIC RIGHT SALPINGO OOPHORECTOMY;  Surgeon: Everitt Amber, MD;  Location: [facility];  Service: Gynecology;  Laterality: Right;  . TONSILLECTOMY       OB History    Gravida  5   Para  4   Term  3   Preterm  1   AB  1   Living  4     SAB  1   IAB      Ectopic      Multiple      Live Births  1           Family History  Problem Relation Age of Onset  . CAD Mother   . CAD Father   . Diabetes Maternal Grandmother   . Diabetes Paternal Grandfather     Social History   Tobacco Use  . Smoking status: Current Every Day Smoker    Packs/day: 0.50    Years: 21.00    Pack years: 10.50    Types: Cigarettes  . Smokeless tobacco: Never Used  Substance Use Topics  . Alcohol use: Yes    Comment: rare social  . Drug use: No    Comment:  x1 episode "Meth" use.    Home Medications Prior to Admission medications   Medication Sig Start Date End Date Taking? Authorizing Provider  aspirin EC 81 MG tablet Take 1 tablet (81 mg total) by mouth daily. Swallow whole. 06/27/20  Yes Park Liter, MD  atorvastatin (LIPITOR) 80 MG tablet Take 1 tablet (80 mg total) by mouth daily. 06/09/20  Yes Seawell, Jaimie A, DO  busPIRone (BUSPAR) 10 MG tablet Take 10 mg by mouth in the morning and at bedtime.   Yes [provider]  carvedilol (COREG) 12.5 MG tablet Take 1 tablet (12.5 mg total) by mouth 2 (two) times daily with a meal. Patient taking differently: Take 12.5 mg by mouth in the morning and at bedtime. 08/13/20  Yes Park Liter, MD  furosemide (LASIX) 20 MG tablet Take 1 tablet (20 mg total) by mouth daily. 02/14/21 05/15/21 Yes Park Liter, MD  gabapentin (NEURONTIN) 300 MG capsule Take 300-600 mg by mouth See admin instructions. Take 300 mg by mouth in the morning and 600 mg at bedtime   Yes [provider]  isosorbide mononitrate (IMDUR) 60 MG 24 hr tablet TAKE 1 TABLET BY MOUTH EVERY DAY Patient taking differently: Take 60 mg by mouth in the morning. 02/12/21  Yes Park Liter, MD  losartan  (COZAAR) 25 MG tablet TAKE 1 TABLET BY MOUTH EVERY DAY Patient taking differently: Take 25 mg by mouth in the morning. 01/02/21  Yes Park Liter, MD  Multiple Vitamins-Minerals (ONE-A-DAY WOMENS PO) Take 1 tablet by mouth daily with breakfast.   Yes [provider]  ranolazine (RANEXA) 500 MG 12 hr tablet TAKE 1 TABLET BY MOUTH TWICE A DAY Patient taking differently: Take 500 mg by mouth in the morning and at bedtime. 03/10/21  Yes Park Liter, MD    Allergies    Patient has no known allergies.  Review of Systems   Review of Systems  Constitutional: Negative for chills, diaphoresis, fatigue and fever.  HENT: Negative for congestion, sore throat and trouble swallowing.   Eyes: Negative for pain and visual disturbance.  Respiratory: Positive for shortness of breath. Negative for cough and wheezing.   Cardiovascular: Positive for chest pain. Negative for palpitations and leg swelling.  Gastrointestinal: Negative for abdominal distention, abdominal pain, diarrhea, nausea and vomiting.  Genitourinary: Negative for difficulty urinating.  Musculoskeletal: Negative for back pain, neck pain and neck stiffness.  Skin: Negative for pallor.  Neurological: Positive for dizziness. Negative for speech difficulty, weakness and headaches.  Psychiatric/Behavioral: Negative for confusion.    Physical Exam Updated Vital Signs BP 112/66   Pulse 74   Temp 98.6 F (37 C)   Resp (!) 21   Ht 5\' 7"  (1.702 m)   Wt 98.4 kg   LMP 03/08/2021   SpO2 97%   BMI 33.98 kg/m   Physical Exam Constitutional:      General: She is not in acute distress.    Appearance: Normal appearance. She is not ill-appearing, toxic-appearing or diaphoretic.  HENT:     Head:     Comments: No facial or periorbital edema.  Normal sensation to face.  Cranial nerve VII intact.    Mouth/Throat:     Mouth: Mucous membranes are moist.     Pharynx: Oropharynx is clear.  Eyes:     General: No scleral  icterus.    Extraocular Movements: Extraocular movements intact.     Pupils: Pupils are equal, round, and reactive to light.  Cardiovascular:     Rate and Rhythm: Normal rate and regular rhythm.     Pulses: Normal pulses.     Heart sounds: Normal heart sounds.  Pulmonary:     Effort: Pulmonary effort is normal. No respiratory distress.     Breath sounds: Normal breath sounds. No stridor. No wheezing, rhonchi or rales.  Chest:     Chest wall: No tenderness.  Abdominal:     General: Abdomen is flat. There is no distension.     Palpations: Abdomen is soft.     Tenderness: There is no abdominal tenderness. There is no guarding or rebound.  Musculoskeletal:        General: No swelling or tenderness. Normal range of motion.     Cervical back: Normal range of motion and neck supple. No rigidity.     Right lower leg: No edema.     Left lower leg: No edema.  Skin:    General: Skin is warm and dry.     Capillary Refill: Capillary refill takes less than 2 seconds.     Coloration: Skin is not pale.  Neurological:     General: No focal deficit present.     Mental Status: She is alert and oriented to person, place, and time.     Comments: Alert. Clear speech. No facial droop. CNIII-XII grossly intact. Bilateral upper and lower extremities' sensation grossly intact. 5/5 symmetric strength with grip strength and with plantar and dorsi flexion bilaterally.. Normal finger to nose bilaterally. Negative pronator drift.   Psychiatric:        Mood and Affect: Mood normal.        Behavior: Behavior normal.     ED Results / Procedures / Treatments   Labs (all labs ordered are listed, but only abnormal results are displayed) Labs Reviewed  CBC - Abnormal; Notable for the following components:      Result Value   WBC 14.8 (*)    RBC 5.68 (*)    Hemoglobin 15.7 (*)    HCT 49.4 (*)    All other components within normal limits  BRAIN NATRIURETIC PEPTIDE - Abnormal; Notable for the following  components:   B Natriuretic Peptide 113.5 (*)    All other components within normal limits  COMPREHENSIVE METABOLIC PANEL - Abnormal; Notable for the following components:   Sodium 134 (*)    Glucose, Bld 297 (*)    All other components within normal limits  I-STAT BETA HCG BLOOD, ED ([facility], [facility], [facility] ONLY)  TROPONIN I (HIGH SENSITIVITY)  TROPONIN I (HIGH SENSITIVITY)  TROPONIN I (HIGH SENSITIVITY)    EKG EKG Interpretation  Date/Time:  Saturday March 15 2021 10:16:49 EDT Ventricular Rate:  78 PR Interval:  158 QRS Duration: 82 QT Interval:  388 QTC Calculation: 442 R Axis:   77 Text Interpretation: Sinus rhythm with occasional Premature ventricular complexes Septal infarct , age undetermined ST & T wave abnormality, consider inferolateral ischemia Abnormal ECG No significant change since last tracing Confirmed by Isla Pence [phone]) on 03/15/2021 10:22:00 AM   Radiology DG Chest 2 View  Result Date: 03/15/2021 CLINICAL DATA:  Chest pain and dizziness EXAM: CHEST - 2 VIEW COMPARISON:  06/07/2020 FINDINGS: Low volume chest which is unchanged. There is no edema, consolidation, effusion, or pneumothorax. Chronic mild interstitial coarsening with right middle lobe scar. History of CABG. Normal heart size and stable mediastinal contours. Thoracic kyphosis. IMPRESSION: Stable exam.  No acute finding. Electronically Signed   By: Neva Seat.D.  On: 03/15/2021 10:31    Procedures Procedures   Medications Ordered in ED Medications  aspirin chewable tablet 324 mg (324 mg Oral Given 03/15/21 1115)    ED Course  I have reviewed the triage vital signs and the nursing notes.  Pertinent labs & imaging results that were available during my care of the patient were reviewed by me and considered in my medical decision making (see chart for details).  Clinical Course as of 03/15/21 1512  Sat Mar 15, 2021  1504 Evaluated by cardiology, Dr Stanford Breed requesting 3rd trop. Anticipate  discharge. AVS prepped by previous EDPA  [CG]    Clinical Course User Index [CG] Kinnie Feil, PA-C   MDM Rules/Calculators/A&P                           Leonore Frankson is a 45 y.o. female with friend past medical history significant for CAD s/p CABG in 2017, hypertension, type 2 diabetes, noncompliance, smoking with recent non-STEMI 2 years ago.  Patient does have extensive coronary artery disease.  Concerns for ACS with HPI.  Also concerns for arrhythmia with cardiac causes for dizziness.  Obtain basic work-up, on cardiac monitoring.  Patient is hemodynamically stable with blood pressure 153/75.  Patient is high risk for ACS with noncompliance of diabetes medications and prior history.  Initial interventions aspirin.   ECG interpreted by me demonstrated unchanged from last time.   CXR interpreted by me demonstrated no acute cardiopulmonary disease.   Labs mostly unremarkable, does have leukocytosis of 14.8 point.  2 negative flat troponins.  Negative orthostatics, patient has not been dizzy while being here.  Does not describe this as vertiginous.  No concerns for central causes, normal neuro exam.  Upon reassessment patient states that she is basically pain-free, did consult cardiology Dr. Stanford Breed who evaluated patient and recommended repeat troponin and then discharge if negative.  Patient will follow up with her cardiologist.  Pt care was handed off to C. Gibbons PA-C at 330.  Complete history and physical and current plan have been communicated.  Please refer to their note for the remainder of ED care and ultimate disposition.  Awaiting repeat troponin.   Final Clinical Impression(s) / ED Diagnoses Final diagnoses:  Chest pain in adult    Rx / DC Orders ED Discharge Orders    None       Alfredia Client, PA-C 03/15/21 1513    Isla Pence, MD 03/15/21 [phone]

## 2021-03-15 NOTE — Discharge Instructions (Addendum)
Please follow-up with your cardiologist.  Your work-up today was reassuring, please come back to the emerge department for any new worsening concerning symptoms.  Please follow-up with your primary care and start taking your medications again. Make sure to stay hydrated.

## 2021-03-17 DIAGNOSIS — F419 Anxiety disorder, unspecified: Secondary | ICD-10-CM | POA: Insufficient documentation

## 2021-03-17 DIAGNOSIS — I214 Non-ST elevation (NSTEMI) myocardial infarction: Secondary | ICD-10-CM | POA: Insufficient documentation

## 2021-03-17 HISTORY — DX: Non-ST elevation (NSTEMI) myocardial infarction: I21.4

## 2021-03-17 HISTORY — DX: Anxiety disorder, unspecified: F41.9

## 2021-03-18 ENCOUNTER — Encounter: Payer: Self-pay | Admitting: Cardiology

## 2021-03-18 ENCOUNTER — Ambulatory Visit (INDEPENDENT_AMBULATORY_CARE_PROVIDER_SITE_OTHER): Payer: PRIVATE HEALTH INSURANCE | Admitting: Cardiology

## 2021-03-18 ENCOUNTER — Other Ambulatory Visit: Payer: Self-pay

## 2021-03-18 VITALS — BP 128/78 | HR 67 | Ht 67.0 in | Wt 210.0 lb

## 2021-03-18 DIAGNOSIS — E782 Mixed hyperlipidemia: Secondary | ICD-10-CM

## 2021-03-18 DIAGNOSIS — Z951 Presence of aortocoronary bypass graft: Secondary | ICD-10-CM

## 2021-03-18 DIAGNOSIS — I25810 Atherosclerosis of coronary artery bypass graft(s) without angina pectoris: Secondary | ICD-10-CM

## 2021-03-18 MED ORDER — RANOLAZINE ER 1000 MG PO TB12: 1000.0000 mg | ORAL_TABLET | Freq: Two times a day (BID) | ORAL | 3 refills | Status: DC

## 2021-03-18 NOTE — Progress Notes (Signed)
Cardiology Office Note:    Date:  03/18/2021   ID:  [name], DOB 09/30/1976, MRN [id number]  PCP:  Zoila Shutter, NP  Cardiologist:  Jenne Campus, MD    Referring MD: Zoila Shutter, NP   Chief Complaint  Patient presents with  . Dizziness    History of Present Illness:    [name] is a 45 y.o. female with past medical history significant for coronary artery disease.  In 2017 she did have coronary artery bypass graft with LIMA to LAD, SVG to RCA.  Also have history of essential hypertension type 2 diabetes smoking.  Recently she ended up going to the hospital because of chest pain.  She will out for myocardial infarction she was discharged home since that time she is doing fair she complained of being weak tired and fatigued.  No more chest pain tightness squeezing pressure burning chest.  She also noted that her blood pressure that usually was elevated now it is coming from the lower side.  Past Medical History:  Diagnosis Date  . Abnormal Pap smear   . Abnormal stress test 08/20/2016   Formatting of this note might be different from the original. Added automatically from request for surgery [id number]  . Acute appendicitis 01/28/2016  . Acute appendicitis with localized peritonitis 01/28/2016  . Acute non-ST segment elevation myocardial infarction ([place]) 03/17/2021  . Anemia   . Angina pectoris ([place]) 06/27/2020  . Anxiety disorder 03/17/2021  . Calculus of gallbladder 06/05/2016  . Cardiomyopathy ([place])    "postpartem cardiomyopathy"- 2012- has since been released by cardiology  . Coronary artery disease 06/27/2020  . COVID 10/14/2020   c/o cough and chest pressure   . Depression   . Diabetes mellitus type II 08/22/2011  . Dyslipidemia 06/27/2020  . Dyspnea on exertion 06/27/2020  . 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 LIMA to LAD, SVG to PDA in September 2017 at [facility]  .  Hyperglycemia due to type 2 diabetes mellitus ([place]) 02/25/2016  . Hypertension   . Liver abscess    2012- Terrial Rhodes drained placed for awhile.  . Long-term insulin use ([place]) 01/26/2017  . Migraine    past x1 -none since.  . Mixed hyperlipidemia 02/25/2016  . NSTEMI (non-ST elevated myocardial infarction) ([place]) 06/07/2020  . Obesity 09/18/2011  . PCOS (polycystic ovarian syndrome) 02/25/2016  . Persistent microalbuminuria associated with type 2 diabetes mellitus ([place]) 02/25/2016  . Renal failure 09/25/2011   Due to vancomycin, IV contrast, sepsis.    . 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 SIRS due to infectious process with acute organ dysfunction ([place]) 09/18/2011   Multiorgan failure secondary to sepsis after IR drainage of liver abscess.  Was hospitalized, intubated, had cardiopulmonary failure.   . Tobacco abuse   . Type 2 diabetes mellitus with hyperglycemia ([place]) 02/25/2016    Past Surgical History:  Procedure Laterality Date  . APPENDECTOMY    . CORONARY ARTERY BYPASS GRAFT  2017   [place]     90's- cervix  . DILATION AND CURETTAGE OF UTERUS  1994  . LAPAROSCOPIC APPENDECTOMY N/A 01/28/2016   Procedure: APPENDECTOMY LAPAROSCOPIC;  Surgeon: Stark Klein, MD;  Location: [place];  Service: General;  Laterality: N/A;  . LEFT HEART CATH AND CORS/GRAFTS ANGIOGRAPHY N/A 06/07/2020  Procedure: LEFT HEART CATH AND CORS/GRAFTS ANGIOGRAPHY;  Surgeon: Martinique, Peter M, MD;  Location: [place] [facility];  Service: Cardiovascular;  Laterality: N/A;  . ROBOTIC ASSISTED SALPINGO OOPHERECTOMY Right 03/24/2016   Procedure: XI ROBOTIC ASSISTED LAPAROSCOPIC RIGHT SALPINGO OOPHORECTOMY;  Surgeon: Everitt Amber, MD;  Location: [facility];  Service: Gynecology;  Laterality: Right;  . TONSILLECTOMY      Current Medications: Current Meds  Medication Sig  . aspirin EC 81 MG tablet Take 1 tablet (81 mg total)  by mouth daily. Swallow whole.  Marland Kitchen atorvastatin (LIPITOR) 80 MG tablet Take 1 tablet (80 mg total) by mouth daily.  . busPIRone (BUSPAR) 10 MG tablet Take 10 mg by mouth in the morning and at bedtime.  . carvedilol (COREG) 12.5 MG tablet Take 1 tablet (12.5 mg total) by mouth 2 (two) times daily with a meal. (Patient taking differently: Take 12.5 mg by mouth in the morning and at bedtime.)  . furosemide (LASIX) 20 MG tablet Take 1 tablet (20 mg total) by mouth daily.  Marland Kitchen gabapentin (NEURONTIN) 300 MG capsule Take 300-600 mg by mouth See admin instructions. Take 300 mg by mouth in the morning and 600 mg at bedtime  . isosorbide mononitrate (IMDUR) 60 MG 24 hr tablet TAKE 1 TABLET BY MOUTH EVERY DAY (Patient taking differently: Take 60 mg by mouth in the morning.)  . losartan (COZAAR) 25 MG tablet TAKE 1 TABLET BY MOUTH EVERY DAY (Patient taking differently: Take 25 mg by mouth in the morning.)  . Multiple Vitamins-Minerals (ONE-A-DAY WOMENS PO) Take 1 tablet by mouth daily with breakfast. Unknown strength  . ranolazine (RANEXA) 500 MG 12 hr tablet TAKE 1 TABLET BY MOUTH TWICE A DAY (Patient taking differently: Take 500 mg by mouth in the morning and at bedtime.)     Allergies:   Patient has no known allergies.   Social History   Socioeconomic History  . Marital status: Single    Spouse name: Not on file  . Number of children: Not on file  . Years of education: Not on file  . Highest education level: Not on file  Occupational History  . Not on file  Tobacco Use  . Smoking status: Current Every Day Smoker    Packs/day: 0.50    Years: 21.00    Pack years: 10.50    Types: Cigarettes  . Smokeless tobacco: Never Used  Substance and Sexual Activity  . Alcohol use: Yes    Comment: rare social  . Drug use: No    Comment: x1 episode "Meth" use.  Marland Kitchen Sexual activity: Not Currently    Birth control/protection: None  Other Topics Concern  . Not on file  Social History Narrative  . Not on file    Social Determinants of Health   Financial Resource Strain: Not on file  Food Insecurity: Not on file  Transportation Needs: Not on file  Physical Activity: Not on file  Stress: Not on file  Social Connections: Not on file     Family History: The patient's family history includes CAD in her father and mother; Diabetes in her maternal grandmother and paternal grandfather. ROS:   Please see the history of present illness.    All 14 point review of systems negative except as described per history of present illness  EKGs/Labs/Other Studies Reviewed:      Recent Labs: 03/15/2021: ALT 22; B Natriuretic Peptide 113.5; BUN 12; Creatinine, Ser 0.70; Hemoglobin 15.7; Platelets 165; Potassium 4.2; Sodium 134  Recent Lipid  Panel    Component Value Date/Time   CHOL 113 09/02/2020 0811   TRIG 100 09/02/2020 0811   HDL 32 (L) 09/02/2020 0811   CHOLHDL 3.5 09/02/2020 0811   CHOLHDL 4.9 06/07/2020 1422   VLDL 23 06/07/2020 1422   LDLCALC 62 09/02/2020 0811    Physical Exam:    VS:  BP 128/78 (BP Location: Right Arm, Patient Position: Sitting)   Pulse 67   Ht 5\' 7"  (1.702 m)   Wt 210 lb (95.3 kg)   LMP 03/08/2021   SpO2 98%   BMI 32.89 kg/m     Wt Readings from Last 3 Encounters:  03/18/21 210 lb (95.3 kg)  03/15/21 216 lb 14.9 oz (98.4 kg)  02/11/21 217 lb (98.4 kg)     GEN:  Well nourished, well developed in no acute distress HEENT: Normal NECK: No JVD; No carotid bruits LYMPHATICS: No lymphadenopathy CARDIAC: RRR, no murmurs, no rubs, no gallops RESPIRATORY:  Clear to auscultation without rales, wheezing or rhonchi  ABDOMEN: Soft, non-tender, non-distended MUSCULOSKELETAL:  No edema; No deformity  SKIN: Warm and dry LOWER EXTREMITIES: no swelling NEUROLOGIC:  Alert and oriented x 3 PSYCHIATRIC:  Normal affect   ASSESSMENT:    1. Coronary artery disease involving coronary bypass graft of native heart without angina pectoris   2. History of coronary artery bypass  graft   3. Mixed hyperlipidemia    PLAN:    In order of problems listed above:  1. Coronary disease status post coronary artery bypass grafting 2017, last cardiac catheterization show extensive collateralization.  Recent visit in the hospital because of chest pain.  Since the time of hospitalization no more chest pain I will increase dose of Prilosec to 1000 mg twice daily. 2. Mixed dyslipidemia we will continue with cholesterol medication. 3. Blood pressure being on the lower side.  Will increase dose of [place] and see how she responds to it if blood pressure will be still low we may be forced to cut down some of her medications and evaluate her for CAD 4. I did review medical records from visiting the emergency room for this visit.   Medication Adjustments/Labs and Tests Ordered: Current medicines are reviewed at length with the patient today.  Concerns regarding medicines are outlined above.  No orders of the defined types were placed in this encounter.  Medication changes: No orders of the defined types were placed in this encounter.   Signed, Park Liter, MD, [facility] 03/18/2021 10:37 AM    [place]

## 2021-03-18 NOTE — Patient Instructions (Signed)
Medication Instructions:  START: Ranexa 1000 mg twice daily *If you need a refill on your cardiac medications before your next appointment, please call your pharmacy*   Lab Work: None If you have labs (blood work) drawn today and your tests are completely normal, you will receive your results only by: Marland Kitchen MyChart Message (if you have MyChart) OR . A paper copy in the mail If you have any lab test that is abnormal or we need to change your treatment, we will call you to review the results.   Testing/Procedures: None   Follow-Up: At [facility], you and your health needs are our priority.  As part of our continuing mission to provide you with exceptional heart care, we have created designated Provider Care Teams.  These Care Teams include your primary Cardiologist (physician) and Advanced Practice Providers (APPs -  Physician Assistants and Nurse Practitioners) who all work together to provide you with the care you need, when you need it.  We recommend signing up for the patient portal called "MyChart".  Sign up information is provided on this After Visit Summary.  MyChart is used to connect with patients for Virtual Visits (Telemedicine).  Patients are able to view lab/test results, encounter notes, upcoming appointments, etc.  Non-urgent messages can be sent to your provider as well.   To learn more about what you can do with MyChart, go to [email or web address].    Your next appointment:   1 month(s)  The format for your next appointment:   In Person  Provider:   Jenne Campus, MD   Other Instructions

## 2021-04-02 ENCOUNTER — Other Ambulatory Visit: Payer: Self-pay

## 2021-04-02 ENCOUNTER — Ambulatory Visit (INDEPENDENT_AMBULATORY_CARE_PROVIDER_SITE_OTHER): Payer: PRIVATE HEALTH INSURANCE | Admitting: Cardiology

## 2021-04-02 ENCOUNTER — Encounter: Payer: Self-pay | Admitting: Cardiology

## 2021-04-02 VITALS — BP 124/62 | HR 73 | Ht 67.0 in | Wt 209.0 lb

## 2021-04-02 DIAGNOSIS — Z720 Tobacco use: Secondary | ICD-10-CM

## 2021-04-02 DIAGNOSIS — I25708 Atherosclerosis of coronary artery bypass graft(s), unspecified, with other forms of angina pectoris: Secondary | ICD-10-CM

## 2021-04-02 DIAGNOSIS — I255 Ischemic cardiomyopathy: Secondary | ICD-10-CM | POA: Diagnosis not present

## 2021-04-02 DIAGNOSIS — E785 Hyperlipidemia, unspecified: Secondary | ICD-10-CM | POA: Diagnosis not present

## 2021-04-02 NOTE — Addendum Note (Signed)
Addended by: Senaida Ores on: 04/02/2021 10:26 AM   Modules accepted: Orders

## 2021-04-02 NOTE — Patient Instructions (Signed)
Medication Instructions:  Your physician has recommended you make the following change in your medication:  STOP: Lasix  Start taking losartan in the evening time  *If you need a refill on your cardiac medications before your next appointment, please call your pharmacy*   Lab Work: None If you have labs (blood work) drawn today and your tests are completely normal, you will receive your results only by: Marland Kitchen MyChart Message (if you have MyChart) OR . A paper copy in the mail If you have any lab test that is abnormal or we need to change your treatment, we will call you to review the results.   Testing/Procedures: None   Follow-Up: At [facility], you and your health needs are our priority.  As part of our continuing mission to provide you with exceptional heart care, we have created designated Provider Care Teams.  These Care Teams include your primary Cardiologist (physician) and Advanced Practice Providers (APPs -  Physician Assistants and Nurse Practitioners) who all work together to provide you with the care you need, when you need it.  We recommend signing up for the patient portal called "MyChart".  Sign up information is provided on this After Visit Summary.  MyChart is used to connect with patients for Virtual Visits (Telemedicine).  Patients are able to view lab/test results, encounter notes, upcoming appointments, etc.  Non-urgent messages can be sent to your provider as well.   To learn more about what you can do with MyChart, go to [email or web address].    Your next appointment:   3 month(s)  The format for your next appointment:   In Person  Provider:   Jenne Campus, MD   Other Instructions

## 2021-04-02 NOTE — Progress Notes (Signed)
Cardiology Office Note:    Date:  04/02/2021   ID:  [name], DOB 1975-12-25, MRN [id number]  PCP:  Zoila Shutter, NP  Cardiologist:  Jenne Campus, MD    Referring MD: Zoila Shutter, NP   Chief Complaint  Patient presents with  . Low BP  . Dizziness    History of Present Illness:    [name] is a 45 y.o. female with past medical history significant for coronary artery disease.  In 2017 she did have coronary artery bypass graft with LIMA to LAD SVG to RCA.  Also history of essential hypertension, type 2 diabetes, smoking with sadly still ongoing.  Last evaluation for coronary artery was done by cardiac catheterization June 2021 which showed completely occluded LAD however LIMA to LAD was patent and functioning normally, there was complete occlusion of the proximal mid RCA, SVG to RCA was completely occluded, however RCA had pretty decent collateralization, there was also a 70% mid to distal obtuse marginal 2. She called [place] yesterday complaining of having low blood pressure and feeling weak and tired.  She is at every single day about 7 and 8:00 in the morning she will have this week sensation she check her blood pressure her blood pressure is low however yesterday for the first time her blood pressure was in the neighborhood of 70-80.  She also wake up at 4:00 in the morning before 7 to 8:00 in the morning is about 3 to 4 hours after she took her medications.  Today she is doing well denies have any symptoms she also mentioned that when she is not working she feels much better.  Past Medical History:  Diagnosis Date  . Abnormal Pap smear   . Abnormal stress test 08/20/2016   Formatting of this note might be different from the original. Added automatically from request for surgery [id number]  . Acute appendicitis 01/28/2016  . Acute appendicitis with localized peritonitis 01/28/2016  . Acute non-ST segment elevation myocardial infarction ([place]) 03/17/2021  . Anemia   . Angina  pectoris ([place]) 06/27/2020  . Anxiety disorder 03/17/2021  . Calculus of gallbladder 06/05/2016  . Cardiomyopathy ([place])    "postpartem cardiomyopathy"- 2012- has since been released by cardiology  . Coronary artery disease 06/27/2020  . COVID 10/14/2020   c/o cough and chest pressure   . Depression   . Diabetes mellitus type II 08/22/2011  . Dyslipidemia 06/27/2020  . Dyspnea on exertion 06/27/2020  . 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 LIMA to LAD, SVG to PDA in September 2017 at [facility]  . Hyperglycemia due to type 2 diabetes mellitus ([place]) 02/25/2016  . Hypertension   . Liver abscess    2012- Terrial Rhodes drained placed for awhile.  . Long-term insulin use ([place]) 01/26/2017  . Migraine    past x1 -none since.  . Mixed hyperlipidemia 02/25/2016  . NSTEMI (non-ST elevated myocardial infarction) ([place]) 06/07/2020  . Obesity 09/18/2011  . PCOS (polycystic ovarian syndrome) 02/25/2016  . Persistent microalbuminuria associated with type 2 diabetes mellitus ([place]) 02/25/2016  . Renal failure 09/25/2011   Due to vancomycin, IV contrast, sepsis.    . 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 SIRS due to infectious process with acute organ dysfunction ([place]) 09/18/2011   Multiorgan failure secondary to sepsis after IR drainage of  liver abscess.  Was hospitalized, intubated, had cardiopulmonary failure.   . Tobacco abuse   . Type 2 diabetes mellitus with hyperglycemia ([place]) 02/25/2016    Past Surgical History:  Procedure Laterality Date  . APPENDECTOMY    . CORONARY ARTERY BYPASS GRAFT  2017   [place]     90's- cervix  . DILATION AND CURETTAGE OF UTERUS  1994  . LAPAROSCOPIC APPENDECTOMY N/A 01/28/2016   Procedure: APPENDECTOMY LAPAROSCOPIC;  Surgeon: Stark Klein, MD;  Location: [place];  Service: General;   Laterality: N/A;  . LEFT HEART CATH AND CORS/GRAFTS ANGIOGRAPHY N/A 06/07/2020   Procedure: LEFT HEART CATH AND CORS/GRAFTS ANGIOGRAPHY;  Surgeon: Martinique, Peter M, MD;  Location: [place] [facility];  Service: Cardiovascular;  Laterality: N/A;  . ROBOTIC ASSISTED SALPINGO OOPHERECTOMY Right 03/24/2016   Procedure: XI ROBOTIC ASSISTED LAPAROSCOPIC RIGHT SALPINGO OOPHORECTOMY;  Surgeon: Everitt Amber, MD;  [facility];  Service: Gynecology;  Laterality: Right;  . TONSILLECTOMY      Current Medications: Current Meds  Medication Sig  . aspirin EC 81 MG tablet Take 1 tablet (81 mg total) by mouth daily. Swallow whole.  Marland Kitchen atorvastatin (LIPITOR) 80 MG tablet Take 1 tablet (80 mg total) by mouth daily.  . busPIRone (BUSPAR) 10 MG tablet Take 10 mg by mouth in the morning and at bedtime.  . carvedilol (COREG) 12.5 MG tablet Take 1 tablet (12.5 mg total) by mouth 2 (two) times daily with a meal. (Patient taking differently: Take 12.5 mg by mouth in the morning and at bedtime.)  . furosemide (LASIX) 20 MG tablet Take 1 tablet (20 mg total) by mouth daily.  Marland Kitchen gabapentin (NEURONTIN) 300 MG capsule Take 300-600 mg by mouth See admin instructions. Take 300 mg by mouth in the morning and 600 mg at bedtime  . isosorbide mononitrate (IMDUR) 60 MG 24 hr tablet TAKE 1 TABLET BY MOUTH EVERY DAY (Patient taking differently: Take 60 mg by mouth in the morning.)  . losartan (COZAAR) 25 MG tablet TAKE 1 TABLET BY MOUTH EVERY DAY (Patient taking differently: Take 25 mg by mouth in the morning.)  . Multiple Vitamins-Minerals (ONE-A-DAY WOMENS PO) Take 1 tablet by mouth daily with breakfast. Unknown strength  . ranolazine (RANEXA) 1000 MG SR tablet Take 1 tablet (1,000 mg total) by mouth 2 (two) times daily.     Allergies:   Patient has no known allergies.   Social History   Socioeconomic History  . Marital status: Single    Spouse name: Not on file  . Number of children: Not on file  . Years of education: Not on  file  . Highest education level: Not on file  Occupational History  . Not on file  Tobacco Use  . Smoking status: Current Every Day Smoker    Packs/day: 0.50    Years: 21.00    Pack years: 10.50    Types: Cigarettes  . Smokeless tobacco: Never Used  Substance and Sexual Activity  . Alcohol use: Yes    Comment: rare social  . Drug use: No    Comment: x1 episode "Meth" use.  Marland Kitchen Sexual activity: Not Currently    Birth control/protection: None  Other Topics Concern  . Not on file  Social History Narrative  . Not on file   Social Determinants of Health   Financial Resource Strain: Not on file  Food Insecurity: Not on file  Transportation Needs: Not on file  Physical Activity: Not on  file  Stress: Not on file  Social Connections: Not on file     Family History: The patient's family history includes CAD in her father and mother; Diabetes in her maternal grandmother and paternal grandfather. ROS:   Please see the history of present illness.    All 14 point review of systems negative except as described per history of present illness  EKGs/Labs/Other Studies Reviewed:      Recent Labs: 03/15/2021: ALT 22; B Natriuretic Peptide 113.5; BUN 12; Creatinine, Ser 0.70; Hemoglobin 15.7; Platelets 165; Potassium 4.2; Sodium 134  Recent Lipid Panel    Component Value Date/Time   CHOL 113 09/02/2020 0811   TRIG 100 09/02/2020 0811   HDL 32 (L) 09/02/2020 0811   CHOLHDL 3.5 09/02/2020 0811   CHOLHDL 4.9 06/07/2020 1422   VLDL 23 06/07/2020 1422   LDLCALC 62 09/02/2020 0811    Physical Exam:    VS:  BP 124/62 (BP Location: Right Arm, Patient Position: Sitting)   Pulse 73   Ht 5\' 7"  (1.702 m)   Wt 209 lb (94.8 kg)   LMP 03/08/2021   SpO2 97%   BMI 32.73 kg/m     Wt Readings from Last 3 Encounters:  04/02/21 209 lb (94.8 kg)  03/18/21 210 lb (95.3 kg)  03/15/21 216 lb 14.9 oz (98.4 kg)     GEN:  Well nourished, well developed in no acute distress HEENT: Normal NECK:  No JVD; No carotid bruits LYMPHATICS: No lymphadenopathy CARDIAC: RRR, no murmurs, no rubs, no gallops RESPIRATORY:  Clear to auscultation without rales, wheezing or rhonchi  ABDOMEN: Soft, non-tender, non-distended MUSCULOSKELETAL:  No edema; No deformity  SKIN: Warm and dry LOWER EXTREMITIES: no swelling NEUROLOGIC:  Alert and oriented x 3 PSYCHIATRIC:  Normal affect   ASSESSMENT:    1. Coronary artery disease of bypass graft of native heart with stable angina pectoris ([place])   2. Ischemic cardiomyopathy   3. Tobacco abuse   4. Dyslipidemia    PLAN:    In order of problems listed above:  1. Coronary disease status post coronary bypass graft with completely occluded graft going to right coronary artery with collateralization of the RCA, LIMA to LAD still functioning properly, she is on appropriate medication which I will continue. 2. Hypotension which happens a couple hours after she takes her medication we did look at all her medications together.  Ask you to discontinue Lasix also ask her to stop taking because her at evening time hopefully by doing this will diminish her dip of the blood pressure in the morning. 3. Dyslipidemia, she is on high intense statin which I will continue I did review her K PN which show me LDL of 62 and HDL 32 this is from September 2021. 4. Smoking still ongoing obviously huge problem I told her she must quit she understands she is trying to work on it.  We spent at least 5 minutes talking about it.   Medication Adjustments/Labs and Tests Ordered: Current medicines are reviewed at length with the patient today.  Concerns regarding medicines are outlined above.  No orders of the defined types were placed in this encounter.  Medication changes: No orders of the defined types were placed in this encounter.   Signed, Park Liter, MD, [facility] 04/02/2021 10:19 AM    [place]

## 2021-04-10 ENCOUNTER — Ambulatory Visit: Payer: PRIVATE HEALTH INSURANCE | Admitting: Cardiology

## 2021-04-17 ENCOUNTER — Ambulatory Visit: Payer: PRIVATE HEALTH INSURANCE | Admitting: Cardiology

## 2021-05-21 ENCOUNTER — Ambulatory Visit: Payer: PRIVATE HEALTH INSURANCE | Admitting: Cardiology

## 2021-06-27 ENCOUNTER — Other Ambulatory Visit: Payer: Self-pay

## 2021-06-27 MED ORDER — CARVEDILOL 12.5 MG PO TABS: 12.5000 mg | ORAL_TABLET | Freq: Two times a day (BID) | ORAL | 3 refills | Status: DC

## 2021-06-27 NOTE — Telephone Encounter (Signed)
Rx approved and sent to requested pharmacy

## 2021-07-17 ENCOUNTER — Ambulatory Visit: Payer: PRIVATE HEALTH INSURANCE | Admitting: Cardiology

## 2021-09-28 ENCOUNTER — Other Ambulatory Visit: Payer: Self-pay | Admitting: Cardiology

## 2021-10-24 ENCOUNTER — Other Ambulatory Visit: Payer: Self-pay

## 2021-10-29 ENCOUNTER — Encounter: Payer: Self-pay | Admitting: Cardiology

## 2021-10-29 ENCOUNTER — Other Ambulatory Visit: Payer: Self-pay

## 2021-10-29 ENCOUNTER — Ambulatory Visit (INDEPENDENT_AMBULATORY_CARE_PROVIDER_SITE_OTHER): Payer: PRIVATE HEALTH INSURANCE | Admitting: Cardiology

## 2021-10-29 VITALS — BP 118/72 | HR 74 | Ht 67.0 in | Wt 205.4 lb

## 2021-10-29 DIAGNOSIS — I10 Essential (primary) hypertension: Secondary | ICD-10-CM

## 2021-10-29 DIAGNOSIS — Z794 Long term (current) use of insulin: Secondary | ICD-10-CM

## 2021-10-29 DIAGNOSIS — E1165 Type 2 diabetes mellitus with hyperglycemia: Secondary | ICD-10-CM | POA: Diagnosis not present

## 2021-10-29 DIAGNOSIS — Z951 Presence of aortocoronary bypass graft: Secondary | ICD-10-CM | POA: Diagnosis not present

## 2021-10-29 DIAGNOSIS — I739 Peripheral vascular disease, unspecified: Secondary | ICD-10-CM

## 2021-10-29 DIAGNOSIS — I209 Angina pectoris, unspecified: Secondary | ICD-10-CM

## 2021-10-29 HISTORY — DX: Peripheral vascular disease, unspecified: I73.9

## 2021-10-29 MED ORDER — ATORVASTATIN CALCIUM 80 MG PO TABS: 80.0000 mg | ORAL_TABLET | Freq: Every day | ORAL | 3 refills | Status: DC

## 2021-10-29 MED ORDER — NITROGLYCERIN 0.4 MG SL SUBL: 0.4000 mg | SUBLINGUAL_TABLET | SUBLINGUAL | 6 refills | Status: DC | PRN

## 2021-10-29 NOTE — Progress Notes (Signed)
Cardiology Office Note:    Date:  10/29/2021   ID:  [name], DOB [birth year], MRN [id number]  PCP:  Barnetta Chapel, NP  Cardiologist:  Jenne Campus, MD    Referring MD: Zoila Shutter, NP   No chief complaint on file. I have pain in my leg  History of Present Illness:    [name] is a 45 y.o. female  with past medical history significant for coronary artery disease.  In 2017 she did have coronary artery bypass graft with LIMA to LAD SVG to RCA.  Also history of essential hypertension, type 2 diabetes, smoking with sadly still ongoing.  Last evaluation for coronary artery was done by cardiac catheterization June 2021 which showed completely occluded LAD however LIMA to LAD was patent and functioning normally, there was complete occlusion of the proximal mid RCA, SVG to RCA was completely occluded, however RCA had pretty decent collateralization, there was also a 70% mid to distal obtuse marginal 2. She comes today to my office for follow-up.  Recently she was complaining of having some leg pain she was sent to hospital to have DVT study by her primary care physician on the DVT study she was noted to have completely occluded right SFA with significant stenosis in the left SFA.  She reports to have fairly typical claudication.  She said when she walks she will get pain especially in the right leg to the point she had to stop.  She also described to have some pain at rest and when she goes to sleep which look like more neuropathic pain.  She does not have any ulceration of her feet.  Denies having any shortness of breath but still complaining of rare episode of chest pain that happen when in stressful situations.  Rarely she needs to use nitroglycerin.  Past Medical History:  Diagnosis Date   Abnormal stress test 08/20/2016   Formatting of this note might be different from the original. Added automatically from request for surgery [id number]   Acute appendicitis 01/28/2016   Acute  appendicitis with localized peritonitis 01/28/2016   Acute non-ST segment elevation myocardial infarction ([place]) 03/17/2021   Anemia    Angina pectoris ([place]) 06/27/2020   Anxiety disorder 03/17/2021   Calculus of gallbladder 06/05/2016   Cardiomyopathy ([place])    "postpartem cardiomyopathy"- 2012- has since been released by cardiology   Coronary artery disease 06/27/2020   COVID 10/14/2020   c/o cough and chest pressure    Depression    Dyslipidemia 06/27/2020   Dyspnea on exertion 06/27/2020   History of coronary artery bypass graft 09/22/2016   Formatting of this note might be different from the original. LIMA to LAD, SVG to PDA in September 2017 at [facility]   Hyperglycemia due to type 2 diabetes mellitus ([place]) 02/25/2016   Hypertension    Liver abscess    2012- Terrial Rhodes drained placed for awhile.   Long-term insulin use ([place]) 01/26/2017   Mixed hyperlipidemia 02/25/2016   NSTEMI (non-ST elevated myocardial infarction) ([place]) 06/07/2020   Obesity 09/18/2011   PCOS (polycystic ovarian syndrome) 02/25/2016   Persistent microalbuminuria associated with type 2 diabetes mellitus ([place]) 02/25/2016   Renal failure 09/25/2011   Due to vancomycin, IV contrast, sepsis.     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  Was hospitalized, intubated, had cardiopulmonary failure.    Tobacco abuse    Type 2 diabetes mellitus with hyperglycemia ([place]) 02/25/2016    Past Surgical History:  Procedure Laterality Date   APPENDECTOMY     CORONARY ARTERY BYPASS GRAFT  2017   [facility]   CRYOTHERAPY     60's- cervix   DILATION AND CURETTAGE OF UTERUS  1994   LAPAROSCOPIC APPENDECTOMY N/A 01/28/2016   Procedure: APPENDECTOMY LAPAROSCOPIC;   Surgeon: Stark Klein, MD;  Location: [place];  Service: General;  Laterality: N/A;   LEFT HEART CATH AND CORS/GRAFTS ANGIOGRAPHY N/A 06/07/2020   Procedure: LEFT HEART CATH AND CORS/GRAFTS ANGIOGRAPHY;  Surgeon: Martinique, Peter M, MD;  Location: [place];  Service: Cardiovascular;  Laterality: N/A;   ROBOTIC ASSISTED SALPINGO OOPHERECTOMY Right 03/24/2016   Procedure: XI ROBOTIC ASSISTED LAPAROSCOPIC RIGHT SALPINGO OOPHORECTOMY;  Surgeon: Everitt Amber, MD;  Location: [facility];  Service: Gynecology;  Laterality: Right;   TONSILLECTOMY      Current Medications: Current Meds  Medication Sig   aspirin EC 81 MG tablet Take 1 tablet (81 mg total) by mouth daily. Swallow whole.   atorvastatin (LIPITOR) 80 MG tablet Take 1 tablet (80 mg total) by mouth daily.   busPIRone (BUSPAR) 15 MG tablet Take 15 mg by mouth 2 (two) times daily.   carvedilol (COREG) 12.5 MG tablet Take 1 tablet (12.5 mg total) by mouth 2 (two) times daily with a meal.   Cholecalciferol (D3 5000) 125 MCG (5000 UT) capsule Take 5,000 Units by mouth daily.   dapagliflozin propanediol (FARXIGA) 10 MG TABS tablet Take 10 mg by mouth daily.   furosemide (LASIX) 20 MG tablet Take 20 mg by mouth daily as needed for edema.   gabapentin (NEURONTIN) 600 MG tablet Take 600 mg by mouth 2 (two) times daily.   isosorbide mononitrate (IMDUR) 60 MG 24 hr tablet TAKE 1 TABLET BY MOUTH EVERY DAY (Patient taking differently: Take 60 mg by mouth in the morning.)   losartan (COZAAR) 25 MG tablet Take 1 tablet by mouth once daily   metFORMIN (GLUCOPHAGE-XR) 500 MG 24 hr tablet Take 1,000 mg by mouth 2 (two) times daily.   ranolazine (RANEXA) 1000 MG SR tablet Take 1 tablet (1,000 mg total) by mouth 2 (two) times daily.     Allergies:   Patient has no known allergies.   Social History   Socioeconomic History   Marital status: Single    Spouse name: Not on file   Number of children: Not on file   Years of education: Not on file   Highest  education level: Not on file  Occupational History   Not on file  Tobacco Use   Smoking status: Every Day    Packs/day: 0.50    Years: 21.00    Pack years: 10.50    Types: Cigarettes   Smokeless tobacco: Never  Substance and Sexual Activity   Alcohol use: Yes    Comment: rare social   Drug use: No    Comment: x1 episode "Meth" use.   Sexual activity: Not Currently    Birth control/protection: None  Other Topics Concern   Not on file  Social History Narrative   Not on file   Social Determinants of Health   Financial Resource Strain: Not on file  Food Insecurity: Not on file  Transportation Needs: Not on file  Physical Activity: Not on file  Stress:  Not on file  Social Connections: Not on file     Family History: The patient's family history includes CAD in her father and mother; Diabetes in her maternal grandmother and paternal grandfather. ROS:   Please see the history of present illness.    All 14 point review of systems negative except as described per history of present illness  EKGs/Labs/Other Studies Reviewed:      Recent Labs: 03/15/2021: ALT 22; B Natriuretic Peptide 113.5; BUN 12; Creatinine, Ser 0.70; Hemoglobin 15.7; Platelets 165; Potassium 4.2; Sodium 134  Recent Lipid Panel    Component Value Date/Time   CHOL 113 09/02/2020 0811   TRIG 100 09/02/2020 0811   HDL 32 (L) 09/02/2020 0811   CHOLHDL 3.5 09/02/2020 0811   CHOLHDL 4.9 06/07/2020 1422   VLDL 23 06/07/2020 1422   LDLCALC 62 09/02/2020 0811    Physical Exam:    VS:  BP 118/72   Pulse 74   Ht 5\' 7"  (1.702 m)   Wt 205 lb 6.4 oz (93.2 kg)   SpO2 98%   BMI 32.17 kg/m     Wt Readings from Last 3 Encounters:  10/29/21 205 lb 6.4 oz (93.2 kg)  04/02/21 209 lb (94.8 kg)  03/18/21 210 lb (95.3 kg)     GEN:  Well nourished, well developed in no acute distress HEENT: Normal NECK: No JVD; No carotid bruits LYMPHATICS: No lymphadenopathy CARDIAC: RRR, no murmurs, no rubs, no  gallops RESPIRATORY:  Clear to auscultation without rales, wheezing or rhonchi  ABDOMEN: Soft, non-tender, non-distended MUSCULOSKELETAL:  No edema; No deformity  SKIN: Warm and dry LOWER EXTREMITIES: no swelling, I do not feel pulses in the right foot dorsalis pedis posterior tibial eyes absent.  On the left side very weak NEUROLOGIC:  Alert and oriented x 3 PSYCHIATRIC:  Normal affect   ASSESSMENT:    1. History of coronary artery bypass graft   2. Angina pectoris ([place])   3. Primary hypertension   4. Type 2 diabetes mellitus with hyperglycemia, with long-term current use of insulin (HCC)   5. Claudication in peripheral vascular disease ([place])    PLAN:    In order of problems listed above:  Peripheral vascular disease.  She does have claudication involving right lower extremities to biggest degree.  Her ultrasound showed possibility of SFA occlusion on the right with significant stenosis on the left.  She will be referred to Dr. Donnella Bi for evaluation for possible angiogram with potential intervention.  In the meantime she is on antiplatelet therapy as well as statin which I will continue.  Luckily there is no ulceration on her foot. Angina pectoris stable pattern rare episode suppressed with nitroglycerin which I will continue.  Cardiac catheterization reviewed again with the patient Essential hypertension blood pressure well controlled in the medevac we do face situation and sometimes her blood pressure is low.  That usually happen when her sugar is out of control.  I told her she need to take it if her blood pressure is low she need to drink some extra fluid. Type 2 diabetes sadly still poorly controlled will call primary care physician to get hemoglobin A1c last time perhaps from last year which was 9.6. Dyslipidemia: She is on high intense statin which I will continue.  We will recheck her fasting lipid profile. Smoking which is still ongoing again he spent at least 5 minutes  talking about need to quit she understands she describes stressful situation at home which forced her to  smoke more.  I told her this is what responsible for problem with her heart as well as coronary arteries.        Medication Adjustments/Labs and Tests Ordered: Current medicines are reviewed at length with the patient today.  Concerns regarding medicines are outlined above.  No orders of the defined types were placed in this encounter.  Medication changes: No orders of the defined types were placed in this encounter.   Signed, Park Liter, MD, [facility] 10/29/2021 8:24 AM    [place]

## 2021-10-29 NOTE — Patient Instructions (Signed)
Medication Instructions:  Your physician has recommended you make the following change in your medication:   Use nitroglycerin 1 tablet placed under the tongue at the first sign of chest pain or an angina attack. 1 tablet may be used every 5 minutes as needed, for up to 15 minutes. Do not take more than 3 tablets in 15 minutes. If pain persist call 911 or go to the nearest ED.  Continue your Atorvastatin.   *If you need a refill on your cardiac medications before your next appointment, please call your pharmacy*   Lab Work: None ordered If you have labs (blood work) drawn today and your tests are completely normal, you will receive your results only by: [place] (if you have MyChart) OR A paper copy in the mail If you have any lab test that is abnormal or we need to change your treatment, we will call you to review the results.   Testing/Procedures: None ordered   Follow-Up: At [facility], you and your health needs are our priority.  As part of our continuing mission to provide you with exceptional heart care, we have created designated Provider Care Teams.  These Care Teams include your primary Cardiologist (physician) and Advanced Practice Providers (APPs -  Physician Assistants and Nurse Practitioners) who all work together to provide you with the care you need, when you need it.  We recommend signing up for the patient portal called "MyChart".  Sign up information is provided on this After Visit Summary.  MyChart is used to connect with patients for Virtual Visits (Telemedicine).  Patients are able to view lab/test results, encounter notes, upcoming appointments, etc.  Non-urgent messages can be sent to your provider as well.   To learn more about what you can do with MyChart, go to [email or web address].    Your next appointment:   4 month(s)  The format for your next appointment:   In Person  Provider:   Jenne Campus, MD   Other Instructions Steps to  Quit Smoking Smoking tobacco is the leading cause of preventable death. It can affect almost every organ in the body. Smoking puts you and people around you at risk for many serious, long-lasting (chronic) diseases. Quitting smoking can be hard, but it is one of the best things that you can do for your health. It is never too late to quit. How do I get ready to quit? When you decide to quit smoking, make a plan to help you succeed. Before you quit: Pick a date to quit. Set a date within the next 2 weeks to give you time to prepare. Write down the reasons why you are quitting. Keep this list in places where you will see it often. Tell your family, friends, and co-workers that you are quitting. Their support is important. Talk with your doctor about the choices that may help you quit. Find out if your health insurance will pay for these treatments. Know the people, places, things, and activities that make you want to smoke (triggers). Avoid them. What first steps can I take to quit smoking? Throw away all cigarettes at home, at work, and in your car. Throw away the things that you use when you smoke, such as ashtrays and lighters. Clean your car. Make sure to empty the ashtray. Clean your home, including curtains and carpets. What can I do to help me quit smoking? Talk with your doctor about taking medicines and seeing a counselor at the same time. You are more likely  to succeed when you do both. If you are pregnant or breastfeeding, talk with your doctor about counseling or other ways to quit smoking. Do not take medicine to help you quit smoking unless your doctor tells you to do so. To quit smoking: Quit right away Quit smoking totally, instead of slowly cutting back on how much you smoke over a period of time. Go to counseling. You are more likely to quit if you go to counseling sessions regularly. Take medicine You may take medicines to help you quit. Some medicines need a prescription, and  some you can buy over-the-counter. Some medicines may contain a drug called nicotine to replace the nicotine in cigarettes. Medicines may: Help you to stop having the desire to smoke (cravings). Help to stop the problems that come when you stop smoking (withdrawal symptoms). Your doctor may ask you to use: Nicotine patches, gum, or lozenges. Nicotine inhalers or sprays. Non-nicotine medicine that is taken by mouth. Find resources Find resources and other ways to help you quit smoking and remain smoke-free after you quit. These resources are most helpful when you use them often. They include: Online chats with a Social worker. Phone quitlines. Printed Furniture conservator/restorer. Support groups or group counseling. Text messaging programs. Mobile phone apps. Use apps on your mobile phone or tablet that can help you stick to your quit plan. There are many free apps for mobile phones and tablets as well as websites. Examples include Quit Guide from the [employer] and smokefree.gov  What things can I do to make it easier to quit?  Talk to your family and friends. Ask them to support and encourage you. Call a phone quitline (1-800-QUIT-NOW), reach out to support groups, or work with a Social worker. Ask people who smoke to not smoke around you. Avoid places that make you want to smoke, such as: Bars. Parties. Smoke-break areas at work. Spend time with people who do not smoke. Lower the stress in your life. Stress can make you want to smoke. Try these things to help your stress: Getting regular exercise. Doing deep-breathing exercises. Doing yoga. Meditating. Doing a body scan. To do this, close your eyes, focus on one area of your body at a time from head to toe. Notice which parts of your body are tense. Try to relax the muscles in those areas. How will I feel when I quit smoking? Day 1 to 3 weeks Within the first 24 hours, you may start to have some problems that come from quitting tobacco. These problems  are very bad 2-3 days after you quit, but they do not often last for more than 2-3 weeks. You may get these symptoms: Mood swings. Feeling restless, nervous, angry, or annoyed. Trouble concentrating. Dizziness. Strong desire for high-sugar foods and nicotine. Weight gain. Trouble pooping (constipation). Feeling like you may vomit (nausea). Coughing or a sore throat. Changes in how the medicines that you take for other issues work in your body. Depression. Trouble sleeping (insomnia). Week 3 and afterward After the first 2-3 weeks of quitting, you may start to notice more positive results, such as: Better sense of smell and taste. Less coughing and sore throat. Slower heart rate. Lower blood pressure. Clearer skin. Better breathing. Fewer sick days. Quitting smoking can be hard. Do not give up if you fail the first time. Some people need to try a few times before they succeed. Do your best to stick to your quit plan, and talk with your doctor if you have any questions or  concerns. Summary Smoking tobacco is the leading cause of preventable death. Quitting smoking can be hard, but it is one of the best things that you can do for your health. When you decide to quit smoking, make a plan to help you succeed. Quit smoking right away, not slowly over a period of time. When you start quitting, seek help from your doctor, family, or friends. This information is not intended to replace advice given to you by your health care provider. Make sure you discuss any questions you have with your health care provider. Document Revised: 08/15/2021 Document Reviewed: 02/25/2019 Elsevier Patient Education  [place].

## 2021-11-16 ENCOUNTER — Emergency Department (HOSPITAL_COMMUNITY)
Admission: EM | Admit: 2021-11-16 | Discharge: 2021-11-17 | Disposition: A | Discharge status: Home / Self Care | Payer: PRIVATE HEALTH INSURANCE | Attending: Emergency Medicine | Admitting: Emergency Medicine

## 2021-11-16 ENCOUNTER — Other Ambulatory Visit: Payer: Self-pay

## 2021-11-16 ENCOUNTER — Emergency Department (HOSPITAL_COMMUNITY): Payer: PRIVATE HEALTH INSURANCE

## 2021-11-16 ENCOUNTER — Encounter (HOSPITAL_COMMUNITY): Payer: Self-pay | Admitting: *Deleted

## 2021-11-16 DIAGNOSIS — F17210 Nicotine dependence, cigarettes, uncomplicated: Secondary | ICD-10-CM | POA: Insufficient documentation

## 2021-11-16 DIAGNOSIS — Z79899 Other long term (current) drug therapy: Secondary | ICD-10-CM | POA: Insufficient documentation

## 2021-11-16 DIAGNOSIS — Z951 Presence of aortocoronary bypass graft: Secondary | ICD-10-CM | POA: Diagnosis not present

## 2021-11-16 DIAGNOSIS — Z7982 Long term (current) use of aspirin: Secondary | ICD-10-CM | POA: Diagnosis not present

## 2021-11-16 DIAGNOSIS — R079 Chest pain, unspecified: Secondary | ICD-10-CM | POA: Diagnosis present

## 2021-11-16 DIAGNOSIS — E1169 Type 2 diabetes mellitus with other specified complication: Secondary | ICD-10-CM | POA: Insufficient documentation

## 2021-11-16 DIAGNOSIS — I209 Angina pectoris, unspecified: Secondary | ICD-10-CM | POA: Insufficient documentation

## 2021-11-16 DIAGNOSIS — Z7984 Long term (current) use of oral hypoglycemic drugs: Secondary | ICD-10-CM | POA: Diagnosis not present

## 2021-11-16 DIAGNOSIS — R809 Proteinuria, unspecified: Secondary | ICD-10-CM | POA: Diagnosis not present

## 2021-11-16 DIAGNOSIS — N9489 Other specified conditions associated with female genital organs and menstrual cycle: Secondary | ICD-10-CM | POA: Insufficient documentation

## 2021-11-16 DIAGNOSIS — I10 Essential (primary) hypertension: Secondary | ICD-10-CM | POA: Diagnosis not present

## 2021-11-16 DIAGNOSIS — Z8616 Personal history of COVID-19: Secondary | ICD-10-CM | POA: Insufficient documentation

## 2021-11-16 LAB — CBC WITH DIFFERENTIAL/PLATELET
Abs Immature Granulocytes: 0.05 10*3/uL (ref 0.00–0.07)
Basophils Absolute: 0.1 10*3/uL (ref 0.0–0.1)
Basophils Relative: 1 %
Eosinophils Absolute: 0 10*3/uL (ref 0.0–0.5)
Eosinophils Relative: 0 %
HCT: 50.5 % — ABNORMAL HIGH (ref 36.0–46.0)
Hemoglobin: 15.7 g/dL — ABNORMAL HIGH (ref 12.0–15.0)
Immature Granulocytes: 0 %
Lymphocytes Relative: 27 %
Lymphs Abs: 3.1 10*3/uL (ref 0.7–4.0)
MCH: 27.9 pg (ref 26.0–34.0)
MCHC: 31.1 g/dL (ref 30.0–36.0)
MCV: 89.7 fL (ref 80.0–100.0)
Monocytes Absolute: 1 10*3/uL (ref 0.1–1.0)
Monocytes Relative: 9 %
Neutro Abs: 7.4 10*3/uL (ref 1.7–7.7)
Neutrophils Relative %: 63 %
Platelets: 178 10*3/uL (ref 150–400)
RBC: 5.63 MIL/uL — ABNORMAL HIGH (ref 3.87–5.11)
RDW: 15.2 % (ref 11.5–15.5)
WBC: 11.7 10*3/uL — ABNORMAL HIGH (ref 4.0–10.5)
nRBC: 0 % (ref 0.0–0.2)

## 2021-11-16 LAB — I-STAT BETA HCG BLOOD, ED (MC, WL, AP ONLY): I-stat hCG, quantitative: <5 m[IU]/mL (ref <5)

## 2021-11-16 LAB — BASIC METABOLIC PANEL
Anion gap: 8 (ref 5–15)
BUN: 13 mg/dL (ref 6–20)
CO2: 26 mmol/L (ref 22–32)
Calcium: 9.7 mg/dL (ref 8.9–10.3)
Chloride: 104 mmol/L (ref 98–111)
Creatinine, Ser: 0.74 mg/dL (ref 0.44–1.00)
GFR, Estimated: >60 mL/min (ref >60)
Glucose, Bld: 119 mg/dL — ABNORMAL HIGH (ref 70–99)
Potassium: 4.1 mmol/L (ref 3.5–5.1)
Sodium: 138 mmol/L (ref 135–145)

## 2021-11-16 LAB — TROPONIN I (HIGH SENSITIVITY): Troponin I (High Sensitivity): 9 ng/L (ref <18)

## 2021-11-16 NOTE — ED Provider Notes (Signed)
Emergency Medicine Provider Triage Evaluation Note  [name] , a 45 y.o. female  was evaluated in triage.  Pt complains of chest pain.  History of CABG, NSTEMI.  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  She was on the floor assisting with patient's, transferring them and doing strenuous activity.  She started getting substernal chest pain.  Radiated into her scapula and down her left arm.  Felt short of breath.  No nausea.  States she typically is able to take nitro however this was out in her car and she was not able to take it.  She was seen by [facility] who gave patient 324 aspirin as well as sublingual nitro and brought here for evaluation.  She is currently chest pain-free.  She denies any lower extremity edema, palpitations. On day 28 post COVID quarantine.  Review of Systems  Positive: Chest pain, shortness of breath Negative: LE edema, cough  Physical Exam  There were no vitals taken for this visit. Gen:   Awake, no distress   Resp:  Normal effort  Chest:  Non tender, midline sternal scar without surrounding erythema MSK:   Moves extremities without difficulty  Other:    Medical Decision Making  Medically screening exam initiated at 9:53 PM.  Appropriate orders placed.  [name] was informed that the remainder of the evaluation will be completed by another provider, this initial triage assessment does not replace that evaluation, and the importance of remaining in the ED until their evaluation is complete.  Chest pain, shortness of breath  Currently chest pain-free after receiving nitro and aspirin with EMS.   Franca Stakes A, PA-C 11/16/21 2153    Orpah Greek, MD 11/17/21 [phone]

## 2021-11-16 NOTE — ED Triage Notes (Signed)
The pt arrived by [facility] from [facility]   chest pain while at work  earlier  ems  gave her 324 aspirin and a sl nitro  pain decreased  iv per ems  nsr on monitor

## 2021-11-17 LAB — TROPONIN I (HIGH SENSITIVITY): Troponin I (High Sensitivity): 8 ng/L (ref <18)

## 2021-11-17 NOTE — ED Provider Notes (Signed)
[facility] EMERGENCY DEPARTMENT Provider Note   CSN: [id number] Arrival date & time: 11/16/21  2135     History Chief Complaint  Patient presents with   Chest Pain    [name] is a 45 y.o. female.  Patient presents to the emergency department for evaluation of chest pain.  Patient reports that she was exerting herself fairly briskly at work when she developed substernal chest pain.  Pain radiated to the left arm.  She also had some associated shortness of breath.  Patient reports that she took a sublingual nitroglycerin and the pain resolved.  Her coworkers became concerned about her and when they saw her taking the nitroglycerin, called EMS.  Patient was transported to the ER.  She has not had any recurrence of symptoms since she arrived in the ED.  Patient reports that symptoms are typical of her angina.  She does occasionally need to use nitroglycerin and situation similar to this.  Her cardiologist is aware of her rare angina episodes.   Chest Pain Associated symptoms: shortness of breath       Past Medical History:  Diagnosis Date   Abnormal stress test 08/20/2016   Formatting of this note might be different from the original. Added automatically from request for surgery [id number]   Acute appendicitis 01/28/2016   Acute appendicitis with localized peritonitis 01/28/2016   Acute non-ST segment elevation myocardial infarction ([place]) 03/17/2021   Anemia    Angina pectoris ([place]) 06/27/2020   Anxiety disorder 03/17/2021   Calculus of gallbladder 06/05/2016   Cardiomyopathy ([place])    "postpartem cardiomyopathy"- 2012- has since been released by cardiology   Coronary artery disease 06/27/2020   COVID 10/14/2020   c/o cough and chest pressure    Depression    Dyslipidemia 06/27/2020   Dyspnea on exertion 06/27/2020   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 LIMA to LAD, SVG to PDA in  September 2017 at [facility]   Hyperglycemia due to type 2 diabetes mellitus ([place]) 02/25/2016   Hypertension    Liver abscess    2012- Terrial Rhodes drained placed for awhile.   Long-term insulin use ([place]) 01/26/2017   Mixed hyperlipidemia 02/25/2016   NSTEMI (non-ST elevated myocardial infarction) ([place]) 06/07/2020   Obesity 09/18/2011   PCOS (polycystic ovarian syndrome) 02/25/2016   Persistent microalbuminuria associated with type 2 diabetes mellitus ([place]) 02/25/2016   Renal failure 09/25/2011   Due to vancomycin, IV contrast, sepsis.     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  Was hospitalized, intubated, had cardiopulmonary failure.    Tobacco abuse    Type 2 diabetes mellitus with hyperglycemia ([place]) 02/25/2016    Patient Active Problem List   Diagnosis Date Noted   Claudication in peripheral vascular disease ([place]) 10/29/2021   Acute non-ST segment elevation myocardial infarction [facility]) 03/17/2021   Anxiety disorder 03/17/2021   COVID 10/14/2020   Tobacco abuse    Hypertension    Depression    Cardiomyopathy ([place])    Anemia    Coronary artery disease 06/27/2020   Angina pectoris ([place]) 06/27/2020   Dyspnea on exertion 06/27/2020   Dyslipidemia 06/27/2020   NSTEMI (non-ST elevated myocardial infarction) ([place])  06/07/2020   Long-term insulin use ([place]) 01/26/2017   History of coronary artery bypass graft 09/22/2016   Abnormal stress test 08/20/2016   Calculus of gallbladder 06/05/2016   Mixed hyperlipidemia 02/25/2016   Persistent microalbuminuria associated with type 2 diabetes mellitus ([place]) 02/25/2016   Hyperglycemia due to type 2 diabetes mellitus ([place]) 02/25/2016   Type 2 diabetes mellitus with hyperglycemia ([place]) 02/25/2016    PCOS (polycystic ovarian syndrome) 02/25/2016   Acute appendicitis 01/28/2016   Acute appendicitis with localized peritonitis 01/28/2016   Renal failure 09/25/2011   SIRS due to infectious process with acute organ dysfunction ([place]) 09/18/2011   Obesity 09/18/2011   Liver abscess 08/20/2011   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/20/2011    Past Surgical History:  Procedure Laterality Date   APPENDECTOMY     CORONARY ARTERY BYPASS GRAFT  2017   [place] N/A 01/28/2016   Procedure: APPENDECTOMY LAPAROSCOPIC;  Surgeon: Stark Klein, MD;  Location: [place];  Service: General;  Laterality: N/A;   LEFT HEART CATH AND CORS/GRAFTS ANGIOGRAPHY N/A 06/07/2020   Procedure: LEFT HEART CATH AND CORS/GRAFTS ANGIOGRAPHY;  Surgeon: Martinique, Peter M, MD;  Location: [place] [facility];  Service: Cardiovascular;  Laterality: N/A;   ROBOTIC ASSISTED SALPINGO OOPHERECTOMY Right 03/24/2016   Procedure: XI ROBOTIC ASSISTED LAPAROSCOPIC RIGHT SALPINGO OOPHORECTOMY;  Surgeon: Everitt Amber, MD;  Location: [facility];  Service: Gynecology;  Laterality: Right;   TONSILLECTOMY       OB History     Gravida  5   Para  4   Term  3   Preterm  1   AB  1   Living  4      SAB  1   IAB      Ectopic      Multiple      Live Births  1           Family History  Problem Relation Age of Onset   CAD Mother    CAD Father    Diabetes Maternal Grandmother    Diabetes Paternal Grandfather     Social History   Tobacco Use   Smoking status: Every Day    Packs/day: 0.50    Years: 21.00    Pack years: 10.50    Types: Cigarettes   Smokeless tobacco: Never  Substance Use Topics   Alcohol use: Yes    Comment: rare social   Drug use: No    Comment: x1 episode "Meth" use.    Home Medications Prior to Admission medications   Medication Sig Start Date End Date Taking? Authorizing Provider   aspirin EC 81 MG tablet Take 1 tablet (81 mg total) by mouth daily. Swallow whole. 06/27/20   Park Liter, MD  atorvastatin (LIPITOR) 80 MG tablet Take 1 tablet (80 mg total) by mouth daily. 10/29/21   Park Liter, MD  busPIRone (BUSPAR) 15 MG tablet Take 15 mg by mouth 2 (two) times daily.    [provider]  carvedilol (COREG) 12.5 MG tablet Take 1 tablet (12.5 mg total) by mouth 2 (two) times daily with a meal. 06/27/21   Park Liter, MD  Cholecalciferol (D3 5000) 125 MCG (5000 UT) capsule Take 5,000 Units by mouth daily.    [provider]  dapagliflozin propanediol (FARXIGA) 10 MG TABS tablet Take 10 mg by mouth daily.  [provider]  furosemide (LASIX) 20 MG tablet Take 20 mg by mouth daily as needed for edema. 10/06/21   [provider]  gabapentin (NEURONTIN) 600 MG tablet Take 600 mg by mouth 2 (two) times daily.    [provider]  isosorbide mononitrate (IMDUR) 60 MG 24 hr tablet TAKE 1 TABLET BY MOUTH EVERY DAY Patient taking differently: Take 60 mg by mouth in the morning. 02/12/21   Park Liter, MD  losartan (COZAAR) 25 MG tablet Take 1 tablet by mouth once daily 09/29/21   Park Liter, MD  metFORMIN (GLUCOPHAGE-XR) 500 MG 24 hr tablet Take 1,000 mg by mouth 2 (two) times daily. 09/22/21   [provider]  nitroGLYCERIN (NITROSTAT) 0.4 MG SL tablet Place 1 tablet (0.4 mg total) under the tongue every 5 (five) minutes as needed. 10/29/21 01/27/22  Park Liter, MD  ranolazine (RANEXA) 1000 MG SR tablet Take 1 tablet (1,000 mg total) by mouth 2 (two) times daily. 03/18/21   Park Liter, MD    Allergies    Patient has no known allergies.  Review of Systems   Review of Systems  Respiratory:  Positive for shortness of breath.   Cardiovascular:  Positive for chest pain.  All other systems reviewed and are negative.  Physical Exam Updated Vital Signs BP 129/69   Pulse 80   Temp  98.7 F (37.1 C)   Resp 18   Ht 5\' 7"  (1.702 m)   Wt 93.2 kg   LMP 11/09/2021   SpO2 94%   BMI 32.18 kg/m   Physical Exam Vitals and nursing note reviewed.  Constitutional:      General: She is not in acute distress.    Appearance: Normal appearance. She is well-developed.  HENT:     Head: Normocephalic and atraumatic.     Right Ear: Hearing normal.     Left Ear: Hearing normal.     Nose: Nose normal.  Eyes:     Conjunctiva/sclera: Conjunctivae normal.     Pupils: Pupils are equal, round, and reactive to light.  Cardiovascular:     Rate and Rhythm: Regular rhythm.     Heart sounds: S1 normal and S2 normal. No murmur heard.   No friction rub. No gallop.  Pulmonary:     Effort: Pulmonary effort is normal. No respiratory distress.     Breath sounds: Normal breath sounds.  Chest:     Chest wall: No tenderness.  Abdominal:     General: Bowel sounds are normal.     Palpations: Abdomen is soft.     Tenderness: There is no abdominal tenderness. There is no guarding or rebound. Negative signs include Murphy's sign and McBurney's sign.     Hernia: No hernia is present.  Musculoskeletal:        General: Normal range of motion.     Cervical back: Normal range of motion and neck supple.  Skin:    General: Skin is warm and dry.     Findings: No rash.  Neurological:     Mental Status: She is alert and oriented to person, place, and time.     GCS: GCS eye subscore is 4. GCS verbal subscore is 5. GCS motor subscore is 6.     Cranial Nerves: No cranial nerve deficit.     Sensory: No sensory deficit.     Coordination: Coordination normal.  Psychiatric:        Speech: Speech normal.  Behavior: Behavior normal.        Thought Content: Thought content normal.    ED Results / Procedures / Treatments   Labs (all labs ordered are listed, but only abnormal results are displayed) Labs Reviewed  CBC WITH DIFFERENTIAL/PLATELET - Abnormal; Notable for the following components:       Result Value   WBC 11.7 (*)    RBC 5.63 (*)    Hemoglobin 15.7 (*)    HCT 50.5 (*)    All other components within normal limits  BASIC METABOLIC PANEL - Abnormal; Notable for the following components:   Glucose, Bld 119 (*)    All other components within normal limits  I-STAT BETA HCG BLOOD, ED ([facility], [facility], [facility] ONLY)  TROPONIN I (HIGH SENSITIVITY)  TROPONIN I (HIGH SENSITIVITY)    EKG None  Radiology DG Chest 2 View  Result Date: 11/16/2021 CLINICAL DATA:  Initial evaluation for acute chest pain. EXAM: CHEST - 2 VIEW COMPARISON:  Prior radiograph from 03/15/2021. FINDINGS: Cardiac and mediastinal silhouettes stable in size and contour, and remain within normal limits. Median sternotomy wires noted. Lungs normally inflated. Chronic coarsening of the interstitial markings with scattered bibasilar scarring, stable. No focal infiltrates. No edema or effusion. No pneumothorax. No acute osseous finding. Exaggeration of the normal thoracic kyphosis noted. IMPRESSION: Stable appearance of the chest.  No active cardiopulmonary disease. Electronically Signed   By: Jeannine Boga M.D.   On: 11/16/2021 22:19    Procedures Procedures   Medications Ordered in ED Medications - No data to display  ED Course  I have reviewed the triage vital signs and the nursing notes.  Pertinent labs & imaging results that were available during my care of the patient were reviewed by me and considered in my medical decision making (see chart for details).    MDM Rules/Calculators/A&P                           Patient presents to the emergency department for evaluation of chest pain.  Patient does have a history of coronary artery disease, status post bypass surgery.  She did have a heart catheterization in 2021 that showed occlusion of one of her grafts with collateralization.  She does occasionally use nitroglycerin for angina.  Patient had a typical attack tonight at work.  She had resolution of her pain  after 1 sublingual nitroglycerin.  Coworkers called EMS and she was transported.  Patient has not had any recurrence of her chest pain or any symptoms while here in the ED.  Troponins are negative.  This appears to be an episode of stable angina, does not require hospitalization at this time.  Will refer back to her cardiologist.  Patient given return precautions.  Final Clinical Impression(s) / ED Diagnoses Final diagnoses:  Angina pectoris [facility])    Rx / DC Orders ED Discharge Orders     None        Yazhini Mcaulay, Gwenyth Allegra, MD 11/17/21 0159

## 2021-11-17 NOTE — ED Notes (Signed)
E-signature pad unavailable at time of pt discharge. This RN discussed discharge materials with pt and answered all pt questions. Pt stated understanding of discharge material. ? ?

## 2021-12-02 ENCOUNTER — Encounter: Payer: Self-pay | Admitting: Cardiovascular Disease

## 2021-12-02 ENCOUNTER — Ambulatory Visit (INDEPENDENT_AMBULATORY_CARE_PROVIDER_SITE_OTHER): Payer: PRIVATE HEALTH INSURANCE | Admitting: Cardiovascular Disease

## 2021-12-02 ENCOUNTER — Other Ambulatory Visit: Payer: Self-pay

## 2021-12-02 VITALS — BP 160/80 | HR 73 | Ht 67.0 in | Wt 203.2 lb

## 2021-12-02 DIAGNOSIS — I739 Peripheral vascular disease, unspecified: Secondary | ICD-10-CM | POA: Diagnosis not present

## 2021-12-02 DIAGNOSIS — Z951 Presence of aortocoronary bypass graft: Secondary | ICD-10-CM

## 2021-12-02 MED ORDER — CILOSTAZOL 50 MG PO TABS: 50.0000 mg | ORAL_TABLET | Freq: Two times a day (BID) | ORAL | 2 refills | Status: DC

## 2021-12-02 NOTE — Progress Notes (Signed)
12/02/2021 [name]   Feb 20, 1976  [id number]  Primary Physician Donna Chapel, NP Primary Cardiologist: Lorretta Harp MD [name], [place]  HPI:  [name] is a 45 y.o. moderately overweight divorced Caucasian female mother of 27, grandmother of 2 grandchildren who works as a Designer, jewellery at an assisted living facility.  She was referred by Dr. Agustin Tate, her cardiologist, for evaluation of lifestyle limiting claudication/PAD.  Risk factors include 30 to 50 pack years of tobacco abuse continuing to smoke 1 to 1-1/2 packs a day.  She has treated hypertension, hyperlipidemia and diabetes.  She has had a heart attack in the past but no stroke.  She had bypass surgery in 2017 and had cardiac catheterization performed by Dr. Martinique Tate 2021 with medical treatment recommended.  She gets occasional nitrate risks responsive chest pain.  She is complained of right greater than left lower extremity claudication for the last year.  She did have recent Dopplers that ruled out DVT but suggested SFA disease bilaterally.   Current Meds  Medication Sig   aspirin EC 81 MG tablet Take 1 tablet (81 mg total) by mouth daily. Swallow whole.   atorvastatin (LIPITOR) 80 MG tablet Take 1 tablet (80 mg total) by mouth daily.   busPIRone (BUSPAR) 15 MG tablet Take 15 mg by mouth 2 (two) times daily.   carvedilol (COREG) 12.5 MG tablet Take 1 tablet (12.5 mg total) by mouth 2 (two) times daily with a meal.   Cholecalciferol (D3 5000) 125 MCG (5000 UT) capsule Take 5,000 Units by mouth daily.   dapagliflozin propanediol (FARXIGA) 10 MG TABS tablet Take 10 mg by mouth daily.   gabapentin (NEURONTIN) 600 MG tablet Take 600 mg by mouth 2 (two) times daily.   isosorbide mononitrate (IMDUR) 60 MG 24 hr tablet TAKE 1 TABLET BY MOUTH EVERY DAY (Patient taking differently: Take 60 mg by mouth in the morning.)   losartan (COZAAR) 25 MG tablet Take 1 tablet by mouth once daily   metFORMIN (GLUCOPHAGE-XR)  500 MG 24 hr tablet Take 1,000 mg by mouth 2 (two) times daily.   nitroGLYCERIN (NITROSTAT) 0.4 MG SL tablet Place 1 tablet (0.4 mg total) under the tongue every 5 (five) minutes as needed.   ranolazine (RANEXA) 1000 MG SR tablet Take 1 tablet (1,000 mg total) by mouth 2 (two) times daily.     No Known Allergies  Social History   Socioeconomic History   Marital status: Single    Spouse name: Not on file   Number of children: Not on file   Years of education: Not on file   Highest education level: Not on file  Occupational History   Not on file  Tobacco Use   Smoking status: Every Day    Packs/day: 0.50    Years: 21.00    Pack years: 10.50    Types: Cigarettes   Smokeless tobacco: Never  Substance and Sexual Activity   Alcohol use: Yes    Comment: rare social   Drug use: No    Comment: x1 episode "Meth" use.   Sexual activity: Not Currently    Birth control/protection: None  Other Topics Concern   Not on file  Social History Narrative   Not on file   Social Determinants of Health   Financial Resource Strain: Not on file  Food Insecurity: Not on file  Transportation Needs: Not on file  Physical Activity: Not on file  Stress: Not on file  Social  Connections: Not on file  Intimate Partner Violence: Not on file     Review of Systems: General: negative for chills, fever, night sweats or weight changes.  Cardiovascular: negative for chest pain, dyspnea on exertion, edema, orthopnea, palpitations, paroxysmal nocturnal dyspnea or shortness of breath Dermatological: negative for rash Respiratory: negative for cough or wheezing Urologic: negative for hematuria Abdominal: negative for nausea, vomiting, diarrhea, bright red blood per rectum, melena, or hematemesis Neurologic: negative for visual changes, syncope, or dizziness All other systems reviewed and are otherwise negative except as noted above.    Blood pressure (!) 160/80, pulse 73, height 5\' 7"  (1.702 m), weight  203 lb 3.2 oz (92.2 kg), last menstrual period 11/09/2021, SpO2 98 %.  General appearance: alert and no distress Neck: no adenopathy, no carotid bruit, no JVD, supple, symmetrical, trachea midline, and thyroid not enlarged, symmetric, no tenderness/mass/nodules Lungs: clear to auscultation bilaterally Heart: regular rate and rhythm, S1, S2 normal, no murmur, click, rub or gallop Extremities: extremities normal, atraumatic, no cyanosis or edema Pulses: Decreased peripheral pulses Skin: Skin color, texture, turgor normal. No rashes or lesions Neurologic: Grossly normal  EKG sinus rhythm at 73 with inferior Q waves and lateral T wave inversion.  She had septal Q waves as well.  I personally reviewed this EKG.  ASSESSMENT AND PLAN:   Claudication in peripheral vascular disease [facility]) Ms. [name] was referred to me by Dr. Agustin Tate for evaluation of lifestyle limiting claudication.  She does have a history of CAD and multiple cardiac risk factors including ongoing tobacco abuse, hypertension, hyperlipidemia and diabetes.  She has had claudication right greater than left for the last year or so which is lifestyle limiting.  She had Doppler studies recently that ruled out DVT but did show SFA disease.  She wishes to pursue a pharmacologic approach prior to intervention which is acceptable.  We will start her on Pletal 50 mg p.o. twice daily, check lower extremity arterial Doppler studies.  I will see her back in 3 months for follow-up.     Lorretta Harp MD FACP,FACC,FAHA, [facility] 12/02/2021 9:02 AM

## 2021-12-02 NOTE — Patient Instructions (Signed)
Medication Instructions:   -Start cilostazol (pletal) 50mg  twice daily.  *If you need a refill on your cardiac medications before your next appointment, please call your pharmacy*   Lab Work: Your physician recommends that you have labs drawn today: lipid/liver profile.  If you have labs (blood work) drawn today and your tests are completely normal, you will receive your results only by: [place] (if you have MyChart) OR A paper copy in the mail If you have any lab test that is abnormal or we need to change your treatment, we will call you to review the results.   Testing/Procedures: Your physician has requested that you have a lower extremity arterial duplex. This test is an ultrasound of the arteries in the legs. It looks at arterial blood flow in the legs. Allow one hour for Lower Arterial scans. There are no restrictions or special instructions.   Your physician has requested that you have an ankle brachial index (ABI). During this test an ultrasound and blood pressure cuff are used to evaluate the arteries that supply the arms and legs with blood. Allow thirty minutes for this exam. There are no restrictions or special instructions.  These procedures are done at [place].    Follow-Up: At [facility], you and your health needs are our priority.  As part of our continuing mission to provide you with exceptional heart care, we have created designated Provider Care Teams.  These Care Teams include your primary Cardiologist (physician) and Advanced Practice Providers (APPs -  Physician Assistants and Nurse Practitioners) who all work together to provide you with the care you need, when you need it.  We recommend signing up for the patient portal called "MyChart".  Sign up information is provided on this After Visit Summary.  MyChart is used to connect with patients for Virtual Visits (Telemedicine).  Patients are able to view lab/test results, encounter notes, upcoming  appointments, etc.  Non-urgent messages can be sent to your provider as well.   To learn more about what you can do with MyChart, go to [email or web address].    Your next appointment:   3 month(s)  The format for your next appointment:   In Person  Provider:   Quay Burow, MD

## 2021-12-02 NOTE — Assessment & Plan Note (Signed)
[name] was referred to me by Dr. Agustin Cree for evaluation of lifestyle limiting claudication.  She does have a history of CAD and multiple cardiac risk factors including ongoing tobacco abuse, hypertension, hyperlipidemia and diabetes.  She has had claudication right greater than left for the last year or so which is lifestyle limiting.  She had Doppler studies recently that ruled out DVT but did show SFA disease.  She wishes to pursue a pharmacologic approach prior to intervention which is acceptable.  We will start her on Pletal 50 mg p.o. twice daily, check lower extremity arterial Doppler studies.  I will see her back in 3 months for follow-up.

## 2021-12-03 LAB — LIPID PANEL
Chol/HDL Ratio: 3.3 ratio (ref 0.0–4.4)
Cholesterol, Total: 99 mg/dL — ABNORMAL LOW (ref 100–199)
HDL: 30 mg/dL — ABNORMAL LOW (ref >39)
LDL Chol Calc (NIH): 45 mg/dL (ref 0–99)
Triglycerides: 132 mg/dL (ref 0–149)
VLDL Cholesterol Cal: 24 mg/dL (ref 5–40)

## 2021-12-03 LAB — HEPATIC FUNCTION PANEL
ALT: 16 IU/L (ref 0–32)
AST: 12 IU/L (ref 0–40)
Albumin: 4.4 g/dL (ref 3.8–4.8)
Alkaline Phosphatase: 50 IU/L (ref 44–121)
Bilirubin Total: 0.4 mg/dL (ref 0.0–1.2)
Bilirubin, Direct: 0.15 mg/dL (ref 0.00–0.40)
Total Protein: 6.3 g/dL (ref 6.0–8.5)

## 2021-12-16 ENCOUNTER — Other Ambulatory Visit: Payer: Self-pay

## 2021-12-16 ENCOUNTER — Ambulatory Visit (HOSPITAL_COMMUNITY)
Admission: RE | Admit: 2021-12-16 | Discharge: 2021-12-16 | Disposition: A | Discharge status: Home / Self Care | Payer: PRIVATE HEALTH INSURANCE | Source: Ambulatory Visit | Attending: Cardiology | Admitting: Cardiology

## 2021-12-16 DIAGNOSIS — I739 Peripheral vascular disease, unspecified: Secondary | ICD-10-CM | POA: Insufficient documentation

## 2021-12-26 ENCOUNTER — Other Ambulatory Visit: Payer: Self-pay

## 2021-12-26 ENCOUNTER — Encounter: Payer: Self-pay | Admitting: Cardiovascular Disease

## 2021-12-26 ENCOUNTER — Ambulatory Visit (INDEPENDENT_AMBULATORY_CARE_PROVIDER_SITE_OTHER): Payer: PRIVATE HEALTH INSURANCE | Admitting: Cardiovascular Disease

## 2021-12-26 DIAGNOSIS — I739 Peripheral vascular disease, unspecified: Secondary | ICD-10-CM

## 2021-12-26 MED ORDER — SODIUM CHLORIDE 0.9% FLUSH: 3.0000 mL | Freq: Two times a day (BID) | INTRAVENOUS | Status: DC

## 2021-12-26 NOTE — Assessment & Plan Note (Signed)
[name] returns for follow-up of her Doppler studies performed 12/16/2021.  This revealed a right ABI of 0.95 and a left of 0.65.  She had a high-frequency signal in her right common iliac artery, occluded right SFA and high-grade left distal common femoral artery.  She is agreed to proceed with peripheral angiography and endovascular therapy which we will arrange sometime in the next 1 to 2 weeks.

## 2021-12-26 NOTE — H&P (View-Only) (Signed)
12/26/2021 [name]   15-Oct-1976  [id number]  Primary Physician Donna Chapel, NP Primary Cardiologist: Donna Harp MD [name], [place]  HPI:  [name] is a 46 y.o.  moderately overweight divorced Caucasian female mother of 56, grandmother of 2 grandchildren who works as a Designer, jewellery at an assisted living facility.  She was referred by Dr. Agustin Tate, her cardiologist, for evaluation of lifestyle limiting claudication/PAD.  I last saw her in the office 12/02/2021.  Risk factors include 30 to 50 pack years of tobacco abuse continuing to smoke 1 to 1-1/2 packs a day.  She has treated hypertension, hyperlipidemia and diabetes.  She has had a heart attack in the past but no stroke.  She had bypass surgery in 2017 and had cardiac catheterization performed by Dr. Martinique Tate 2021 with medical treatment recommended.  She gets occasional nitrate risks responsive chest pain.  She is complained of right greater than left lower extremity claudication for the last year.  She did have recent Dopplers that ruled out DVT but suggested SFA disease bilaterally.  I obtained lower extremity arterial Doppler studies on 12/16/2021 revealing right ABI of 0.45 and a left of 0.65.  She had a high-frequency signal at the origin of her right common iliac artery, occluded right SFA and high-grade distal left common femoral artery stenosis.  She has right greater than left lower extremity claudication which has been lifestyle limiting and progressive for over a year.  She wishes to proceed with endovascular therapy.   Current Meds  Medication Sig   aspirin EC 81 MG tablet Take 1 tablet (81 mg total) by mouth daily. Swallow whole.   atorvastatin (LIPITOR) 80 MG tablet Take 1 tablet (80 mg total) by mouth daily.   busPIRone (BUSPAR) 15 MG tablet Take 15 mg by mouth 2 (two) times daily.   carvedilol (COREG) 12.5 MG tablet Take 1 tablet (12.5 mg total) by mouth 2 (two) times daily with a meal.    Cholecalciferol (D3 5000) 125 MCG (5000 UT) capsule Take 5,000 Units by mouth daily.   cilostazol (PLETAL) 50 MG tablet Take 1 tablet (50 mg total) by mouth 2 (two) times daily.   dapagliflozin propanediol (FARXIGA) 10 MG TABS tablet Take 10 mg by mouth daily.   gabapentin (NEURONTIN) 600 MG tablet Take 600 mg by mouth 2 (two) times daily.   isosorbide mononitrate (IMDUR) 60 MG 24 hr tablet TAKE 1 TABLET BY MOUTH EVERY DAY (Patient taking differently: Take 60 mg by mouth in the morning.)   losartan (COZAAR) 25 MG tablet Take 1 tablet by mouth once daily   metFORMIN (GLUCOPHAGE-XR) 500 MG 24 hr tablet Take 1,000 mg by mouth 2 (two) times daily.   nitroGLYCERIN (NITROSTAT) 0.4 MG SL tablet Place 1 tablet (0.4 mg total) under the tongue every 5 (five) minutes as needed.   ranolazine (RANEXA) 1000 MG SR tablet Take 1 tablet (1,000 mg total) by mouth 2 (two) times daily.     No Known Allergies  Social History   Socioeconomic History   Marital status: Single    Spouse name: Not on file   Number of children: Not on file   Years of education: Not on file   Highest education level: Not on file  Occupational History   Not on file  Tobacco Use   Smoking status: Every Day    Packs/day: 0.50    Years: 21.00    Pack years: 10.50    Types:  Cigarettes   Smokeless tobacco: Never  Substance and Sexual Activity   Alcohol use: Yes    Comment: rare social   Drug use: No    Comment: x1 episode "Meth" use.   Sexual activity: Not Currently    Birth control/protection: None  Other Topics Concern   Not on file  Social History Narrative   Not on file   Social Determinants of Health   Financial Resource Strain: Not on file  Food Insecurity: Not on file  Transportation Needs: Not on file  Physical Activity: Not on file  Stress: Not on file  Social Connections: Not on file  Intimate Partner Violence: Not on file     Review of Systems: General: negative for chills, fever, night sweats or  weight changes.  Cardiovascular: negative for chest pain, dyspnea on exertion, edema, orthopnea, palpitations, paroxysmal nocturnal dyspnea or shortness of breath Dermatological: negative for rash Respiratory: negative for cough or wheezing Urologic: negative for hematuria Abdominal: negative for nausea, vomiting, diarrhea, bright red blood per rectum, melena, or hematemesis Neurologic: negative for visual changes, syncope, or dizziness All other systems reviewed and are otherwise negative except as noted above.    Blood pressure 134/68, pulse 93, height 5\' 7"  (1.702 m), weight 208 lb (94.3 kg).  General appearance: alert and no distress Neck: no adenopathy, no carotid bruit, no JVD, supple, symmetrical, trachea midline, and thyroid not enlarged, symmetric, no tenderness/mass/nodules Lungs: clear to auscultation bilaterally Heart: regular rate and rhythm, S1, S2 normal, no murmur, click, rub or gallop Extremities: extremities normal, atraumatic, no cyanosis or edema Pulses: Diminished pedal pulses bilaterally Skin: Skin color, texture, turgor normal. No rashes or lesions Neurologic: Grossly normal  EKG not performed today  ASSESSMENT AND PLAN:   Claudication in peripheral vascular disease ([name]) Ms. [name] returns for follow-up of her Doppler studies performed 12/16/2021.  This revealed a right ABI of 0.95 and a left of 0.65.  She had a high-frequency signal in her right common iliac artery, occluded right SFA and high-grade left distal common femoral artery.  She is agreed to proceed with peripheral angiography and endovascular therapy which we will arrange sometime in the next 1 to 2 weeks.     Donna Harp MD FACP,FACC,FAHA, [facility] 12/26/2021 1:52 PM

## 2021-12-26 NOTE — Patient Instructions (Addendum)
Medication Instructions:  Your physician recommends that you continue on your current medications as directed. Please refer to the Current Medication list given to you today.  *If you need a refill on your cardiac medications before your next appointment, please call your pharmacy*   Lab Work: Your physician recommends that you have labs drawn today: BMET and CBC  If you have labs (blood work) drawn today and your tests are completely normal, you will receive your results only by: [place] (if you have MyChart) OR A paper copy in the mail If you have any lab test that is abnormal or we need to change your treatment, we will call you to review the results.   Testing/Procedures: Your physician has requested that you have a lower extremity arterial duplex. This test is an ultrasound of the arteries in the legs. It looks at arterial blood flow in the legs. Allow one hour for Lower Arterial scans. There are no restrictions or special instructions.  Your physician has requested that you have an ankle brachial index (ABI). During this test an ultrasound and blood pressure cuff are used to evaluate the arteries that supply the arms and legs with blood. Allow thirty minutes for this exam. There are no restrictions or special instructions. To be done 1 week after procedure. These procedures are done at [facility].   Follow-Up: At [facility], you and your health needs are our priority.  As part of our continuing mission to provide you with exceptional heart care, we have created designated Provider Care Teams.  These Care Teams include your primary Cardiologist (physician) and Advanced Practice Providers (APPs -  Physician Assistants and Nurse Practitioners) who all work together to provide you with the care you need, when you need it.  We recommend signing up for the patient portal called "MyChart".  Sign up information is provided on this After Visit Summary.  MyChart is used to  connect with patients for Virtual Visits (Telemedicine).  Patients are able to view lab/test results, encounter notes, upcoming appointments, etc.  Non-urgent messages can be sent to your provider as well.   To learn more about what you can do with MyChart, go to [email or web address].    Your next appointment:   2-3 week(s) after your procedure  The format for your next appointment:   In Person  Provider:   Quay Burow, MD   Other Instructions  [place] [address] Dept: [phone] Loc: [phone]  Tristin Gladman  12/26/2021  You are scheduled for a Peripheral Angiogram on Monday, January 9 with Dr. Quay Burow.  1. Please arrive at the [facility] (Main Entrance A) at [facility]: [address] [place], [place] [address] at 7:30 AM (This time is two hours before your procedure to ensure your preparation). Free valet parking service is available.   Special note: Every effort is made to have your procedure done on time. Please understand that emergencies sometimes delay scheduled procedures.  2. Diet: Do not eat solid foods after midnight.  The patient may have clear liquids until 5am upon the day of the procedure.  3. Labs: You will need to have blood drawn on Friday.   4. Medication instructions in preparation for your procedure:   Do not take Diabetes Med Glucophage (Metformin) on the day of the procedure and HOLD 48 HOURS AFTER THE PROCEDURE.  On the morning of your procedure, take  your Aspirin and any morning medicines NOT listed above.  You may use sips of water.  5. Plan for one night stay--bring personal belongings. 6. Bring a current list of your medications and current insurance cards. 7. You MUST have a responsible person to drive you home. 8. Someone MUST be with you the first 24 hours after you arrive home or your discharge will be  delayed. 9. Please wear clothes that are easy to get on and off and wear slip-on shoes.  Thank you for allowing [place] to care for you!   -- [facility]

## 2021-12-26 NOTE — Progress Notes (Signed)
12/26/2021 Melesa Lecy   June 05, 1976  [id number]  Primary Physician Barnetta Chapel, NP Primary Cardiologist: Lorretta Harp MD [name], [place]  HPI:  [name] is a 46 y.o.  moderately overweight divorced Caucasian female mother of 62, grandmother of 2 grandchildren who works as a Designer, jewellery at an assisted living facility.  She was referred by Dr. Agustin Cree, her cardiologist, for evaluation of lifestyle limiting claudication/PAD.  I last saw her in the office 12/02/2021.  Risk factors include 30 to 50 pack years of tobacco abuse continuing to smoke 1 to 1-1/2 packs a day.  She has treated hypertension, hyperlipidemia and diabetes.  She has had a heart attack in the past but no stroke.  She had bypass surgery in 2017 and had cardiac catheterization performed by Dr. Martinique June 2021 with medical treatment recommended.  She gets occasional nitrate risks responsive chest pain.  She is complained of right greater than left lower extremity claudication for the last year.  She did have recent Dopplers that ruled out DVT but suggested SFA disease bilaterally.  I obtained lower extremity arterial Doppler studies on 12/16/2021 revealing right ABI of 0.45 and a left of 0.65.  She had a high-frequency signal at the origin of her right common iliac artery, occluded right SFA and high-grade distal left common femoral artery stenosis.  She has right greater than left lower extremity claudication which has been lifestyle limiting and progressive for over a year.  She wishes to proceed with endovascular therapy.   Current Meds  Medication Sig   aspirin EC 81 MG tablet Take 1 tablet (81 mg total) by mouth daily. Swallow whole.   atorvastatin (LIPITOR) 80 MG tablet Take 1 tablet (80 mg total) by mouth daily.   busPIRone (BUSPAR) 15 MG tablet Take 15 mg by mouth 2 (two) times daily.   carvedilol (COREG) 12.5 MG tablet Take 1 tablet (12.5 mg total) by mouth 2 (two) times daily with a meal.    Cholecalciferol (D3 5000) 125 MCG (5000 UT) capsule Take 5,000 Units by mouth daily.   cilostazol (PLETAL) 50 MG tablet Take 1 tablet (50 mg total) by mouth 2 (two) times daily.   dapagliflozin propanediol (FARXIGA) 10 MG TABS tablet Take 10 mg by mouth daily.   gabapentin (NEURONTIN) 600 MG tablet Take 600 mg by mouth 2 (two) times daily.   isosorbide mononitrate (IMDUR) 60 MG 24 hr tablet TAKE 1 TABLET BY MOUTH EVERY DAY (Patient taking differently: Take 60 mg by mouth in the morning.)   losartan (COZAAR) 25 MG tablet Take 1 tablet by mouth once daily   metFORMIN (GLUCOPHAGE-XR) 500 MG 24 hr tablet Take 1,000 mg by mouth 2 (two) times daily.   nitroGLYCERIN (NITROSTAT) 0.4 MG SL tablet Place 1 tablet (0.4 mg total) under the tongue every 5 (five) minutes as needed.   ranolazine (RANEXA) 1000 MG SR tablet Take 1 tablet (1,000 mg total) by mouth 2 (two) times daily.     No Known Allergies  Social History   Socioeconomic History   Marital status: Single    Spouse name: Not on file   Number of children: Not on file   Years of education: Not on file   Highest education level: Not on file  Occupational History   Not on file  Tobacco Use   Smoking status: Every Day    Packs/day: 0.50    Years: 21.00    Pack years: 10.50    Types:  Cigarettes   Smokeless tobacco: Never  Substance and Sexual Activity   Alcohol use: Yes    Comment: rare social   Drug use: No    Comment: x1 episode "Meth" use.   Sexual activity: Not Currently    Birth control/protection: None  Other Topics Concern   Not on file  Social History Narrative   Not on file   Social Determinants of Health   Financial Resource Strain: Not on file  Food Insecurity: Not on file  Transportation Needs: Not on file  Physical Activity: Not on file  Stress: Not on file  Social Connections: Not on file  Intimate Partner Violence: Not on file     Review of Systems: General: negative for chills, fever, night sweats or  weight changes.  Cardiovascular: negative for chest pain, dyspnea on exertion, edema, orthopnea, palpitations, paroxysmal nocturnal dyspnea or shortness of breath Dermatological: negative for rash Respiratory: negative for cough or wheezing Urologic: negative for hematuria Abdominal: negative for nausea, vomiting, diarrhea, bright red blood per rectum, melena, or hematemesis Neurologic: negative for visual changes, syncope, or dizziness All other systems reviewed and are otherwise negative except as noted above.    Blood pressure 134/68, pulse 93, height 5\' 7"  (1.702 m), weight 208 lb (94.3 kg).  General appearance: alert and no distress Neck: no adenopathy, no carotid bruit, no JVD, supple, symmetrical, trachea midline, and thyroid not enlarged, symmetric, no tenderness/mass/nodules Lungs: clear to auscultation bilaterally Heart: regular rate and rhythm, S1, S2 normal, no murmur, click, rub or gallop Extremities: extremities normal, atraumatic, no cyanosis or edema Pulses: Diminished pedal pulses bilaterally Skin: Skin color, texture, turgor normal. No rashes or lesions Neurologic: Grossly normal  EKG not performed today  ASSESSMENT AND PLAN:   Claudication in peripheral vascular disease ([place]) Ms. Sheriff returns for follow-up of her Doppler studies performed 12/16/2021.  This revealed a right ABI of 0.95 and a left of 0.65.  She had a high-frequency signal in her right common iliac artery, occluded right SFA and high-grade left distal common femoral artery.  She is agreed to proceed with peripheral angiography and endovascular therapy which we will arrange sometime in the next 1 to 2 weeks.     Lorretta Harp MD FACP,FACC,FAHA, [facility] 12/26/2021 1:52 PM

## 2021-12-26 NOTE — Addendum Note (Signed)
Addended by: Beatrix Fetters on: 12/26/2021 02:16 PM   Modules accepted: Orders

## 2021-12-27 LAB — BASIC METABOLIC PANEL
BUN/Creatinine Ratio: 19 (ref 9–23)
BUN: 14 mg/dL (ref 6–24)
CO2: 27 mmol/L (ref 20–29)
Calcium: 9.7 mg/dL (ref 8.7–10.2)
Chloride: 99 mmol/L (ref 96–106)
Creatinine, Ser: 0.73 mg/dL (ref 0.57–1.00)
Glucose: 131 mg/dL — ABNORMAL HIGH (ref 70–99)
Potassium: 4.5 mmol/L (ref 3.5–5.2)
Sodium: 139 mmol/L (ref 134–144)
eGFR: 102 mL/min/{1.73_m2} (ref >59)

## 2021-12-27 LAB — CBC
Hematocrit: 43.6 % (ref 34.0–46.6)
Hemoglobin: 14.5 g/dL (ref 11.1–15.9)
MCH: 28.4 pg (ref 26.6–33.0)
MCHC: 33.3 g/dL (ref 31.5–35.7)
MCV: 86 fL (ref 79–97)
Platelets: 202 10*3/uL (ref 150–450)
RBC: 5.1 x10E6/uL (ref 3.77–5.28)
RDW: 13.3 % (ref 11.7–15.4)
WBC: 10.8 10*3/uL (ref 3.4–10.8)

## 2021-12-29 ENCOUNTER — Encounter (HOSPITAL_COMMUNITY)
Admission: RE | Disposition: A | Discharge status: Home / Self Care | Payer: Self-pay | Source: Home / Self Care | Attending: Cardiovascular Disease

## 2021-12-29 ENCOUNTER — Other Ambulatory Visit: Payer: Self-pay

## 2021-12-29 ENCOUNTER — Ambulatory Visit (HOSPITAL_COMMUNITY)
Admission: RE | Admit: 2021-12-29 | Discharge: 2021-12-30 | Disposition: A | Discharge status: Home / Self Care | Payer: PRIVATE HEALTH INSURANCE | Attending: Cardiovascular Disease | Admitting: Cardiovascular Disease

## 2021-12-29 DIAGNOSIS — E785 Hyperlipidemia, unspecified: Secondary | ICD-10-CM | POA: Insufficient documentation

## 2021-12-29 DIAGNOSIS — Z20822 Contact with and (suspected) exposure to covid-19: Secondary | ICD-10-CM | POA: Insufficient documentation

## 2021-12-29 DIAGNOSIS — I739 Peripheral vascular disease, unspecified: Secondary | ICD-10-CM

## 2021-12-29 DIAGNOSIS — I70213 Atherosclerosis of native arteries of extremities with intermittent claudication, bilateral legs: Secondary | ICD-10-CM | POA: Insufficient documentation

## 2021-12-29 DIAGNOSIS — I252 Old myocardial infarction: Secondary | ICD-10-CM | POA: Insufficient documentation

## 2021-12-29 DIAGNOSIS — I2510 Atherosclerotic heart disease of native coronary artery without angina pectoris: Secondary | ICD-10-CM | POA: Insufficient documentation

## 2021-12-29 DIAGNOSIS — Z951 Presence of aortocoronary bypass graft: Secondary | ICD-10-CM | POA: Diagnosis not present

## 2021-12-29 DIAGNOSIS — F17210 Nicotine dependence, cigarettes, uncomplicated: Secondary | ICD-10-CM | POA: Diagnosis not present

## 2021-12-29 DIAGNOSIS — E1151 Type 2 diabetes mellitus with diabetic peripheral angiopathy without gangrene: Secondary | ICD-10-CM | POA: Diagnosis not present

## 2021-12-29 DIAGNOSIS — I708 Atherosclerosis of other arteries: Secondary | ICD-10-CM | POA: Insufficient documentation

## 2021-12-29 DIAGNOSIS — I70211 Atherosclerosis of native arteries of extremities with intermittent claudication, right leg: Secondary | ICD-10-CM | POA: Diagnosis not present

## 2021-12-29 DIAGNOSIS — I10 Essential (primary) hypertension: Secondary | ICD-10-CM | POA: Diagnosis not present

## 2021-12-29 HISTORY — PX: ABDOMINAL AORTOGRAM W/LOWER EXTREMITY: CATH118223

## 2021-12-29 HISTORY — PX: PERIPHERAL VASCULAR INTERVENTION: CATH118257

## 2021-12-29 LAB — RESPIRATORY PANEL BY PCR

## 2021-12-29 LAB — POCT ACTIVATED CLOTTING TIME
Activated Clotting Time: 251 seconds
Activated Clotting Time: 275 seconds
Activated Clotting Time: 185 seconds
Activated Clotting Time: 173 seconds

## 2021-12-29 LAB — GLUCOSE, CAPILLARY
Glucose-Capillary: 199 mg/dL — ABNORMAL HIGH (ref 70–99)
Glucose-Capillary: 129 mg/dL — ABNORMAL HIGH (ref 70–99)

## 2021-12-29 LAB — RESP PANEL BY RT-PCR (FLU A&B, COVID) ARPGX2
Influenza A by PCR: NEGATIVE
Influenza B by PCR: NEGATIVE
SARS Coronavirus 2 by RT PCR: NEGATIVE

## 2021-12-29 LAB — PREGNANCY, URINE: Preg Test, Ur: NEGATIVE

## 2021-12-29 SURGERY — ABDOMINAL AORTOGRAM W/LOWER EXTREMITY
Anesthesia: LOCAL

## 2021-12-29 MED ORDER — ASPIRIN 81 MG PO CHEW: 81.0000 mg | CHEWABLE_TABLET | ORAL | Status: DC

## 2021-12-29 MED ORDER — SODIUM CHLORIDE 0.9% FLUSH: 3.0000 mL | INTRAVENOUS | Status: DC | PRN

## 2021-12-29 MED ORDER — CLOPIDOGREL BISULFATE 300 MG PO TABS
ORAL_TABLET | ORAL | Status: AC
  Filled 2021-12-29: qty 1

## 2021-12-29 MED ORDER — ASPIRIN EC 81 MG PO TBEC
81.0000 mg | DELAYED_RELEASE_TABLET | Freq: Every day | ORAL | Status: DC
  Administered 2021-12-30: 81 mg via ORAL
  Filled 2021-12-29: qty 1

## 2021-12-29 MED ORDER — IODIXANOL 320 MG/ML IV SOLN
INTRAVENOUS | Status: DC | PRN
  Administered 2021-12-29: 185 mL

## 2021-12-29 MED ORDER — DAPAGLIFLOZIN PROPANEDIOL 10 MG PO TABS
10.0000 mg | ORAL_TABLET | Freq: Every day | ORAL | Status: DC
  Administered 2021-12-30: 10 mg via ORAL
  Filled 2021-12-29: qty 1

## 2021-12-29 MED ORDER — ATORVASTATIN CALCIUM 80 MG PO TABS
80.0000 mg | ORAL_TABLET | Freq: Every day | ORAL | Status: DC
  Administered 2021-12-29 – 2021-12-30 (×2): 80 mg via ORAL
  Filled 2021-12-29 (×2): qty 1

## 2021-12-29 MED ORDER — HEPARIN SODIUM (PORCINE) 1000 UNIT/ML IJ SOLN
INTRAMUSCULAR | Status: AC
  Filled 2021-12-29: qty 20

## 2021-12-29 MED ORDER — SODIUM CHLORIDE 0.9 % IV SOLN: 250.0000 mL | INTRAVENOUS | Status: DC | PRN

## 2021-12-29 MED ORDER — FENTANYL CITRATE (PF) 100 MCG/2ML IJ SOLN
INTRAMUSCULAR | Status: AC
  Filled 2021-12-29: qty 2

## 2021-12-29 MED ORDER — ISOSORBIDE MONONITRATE ER 60 MG PO TB24
60.0000 mg | ORAL_TABLET | Freq: Every day | ORAL | Status: DC
  Administered 2021-12-30: 60 mg via ORAL
  Filled 2021-12-29: qty 1

## 2021-12-29 MED ORDER — ACETAMINOPHEN 325 MG PO TABS: 650.0000 mg | ORAL_TABLET | ORAL | Status: DC | PRN

## 2021-12-29 MED ORDER — SODIUM CHLORIDE 0.9 % IV SOLN: INTRAVENOUS | Status: AC

## 2021-12-29 MED ORDER — BUSPIRONE HCL 5 MG PO TABS
15.0000 mg | ORAL_TABLET | Freq: Two times a day (BID) | ORAL | Status: DC
  Administered 2021-12-29 – 2021-12-30 (×2): 15 mg via ORAL
  Filled 2021-12-29 (×2): qty 3

## 2021-12-29 MED ORDER — CARVEDILOL 12.5 MG PO TABS
12.5000 mg | ORAL_TABLET | Freq: Two times a day (BID) | ORAL | Status: DC
Start: 2021-12-29 — End: 2021-12-30
  Administered 2021-12-29 – 2021-12-30 (×2): 12.5 mg via ORAL
  Filled 2021-12-29 (×2): qty 1

## 2021-12-29 MED ORDER — SODIUM CHLORIDE 0.9 % WEIGHT BASED INFUSION: 1.0000 mL/kg/h | INTRAVENOUS | Status: DC

## 2021-12-29 MED ORDER — LIDOCAINE HCL (PF) 1 % IJ SOLN
INTRAMUSCULAR | Status: DC | PRN
  Administered 2021-12-29: 20 mL

## 2021-12-29 MED ORDER — SODIUM CHLORIDE 0.9% FLUSH: 3.0000 mL | Freq: Two times a day (BID) | INTRAVENOUS | Status: DC

## 2021-12-29 MED ORDER — MORPHINE SULFATE (PF) 2 MG/ML IV SOLN
2.0000 mg | INTRAVENOUS | Status: DC | PRN
  Administered 2021-12-29: 2 mg via INTRAVENOUS

## 2021-12-29 MED ORDER — ONDANSETRON HCL 4 MG/2ML IJ SOLN: 4.0000 mg | Freq: Four times a day (QID) | INTRAMUSCULAR | Status: DC | PRN

## 2021-12-29 MED ORDER — GABAPENTIN 600 MG PO TABS
600.0000 mg | ORAL_TABLET | Freq: Two times a day (BID) | ORAL | Status: DC
  Administered 2021-12-29 – 2021-12-30 (×2): 600 mg via ORAL
  Filled 2021-12-29 (×2): qty 1

## 2021-12-29 MED ORDER — HEPARIN (PORCINE) IN NACL 1000-0.9 UT/500ML-% IV SOLN
INTRAVENOUS | Status: AC
  Filled 2021-12-29: qty 1000

## 2021-12-29 MED ORDER — ASPIRIN EC 81 MG PO TBEC: 81.0000 mg | DELAYED_RELEASE_TABLET | Freq: Every day | ORAL | Status: DC

## 2021-12-29 MED ORDER — MIDAZOLAM HCL 2 MG/2ML IJ SOLN
INTRAMUSCULAR | Status: AC
  Filled 2021-12-29: qty 2

## 2021-12-29 MED ORDER — CLOPIDOGREL BISULFATE 300 MG PO TABS
ORAL_TABLET | ORAL | Status: DC | PRN
  Administered 2021-12-29: 300 mg via ORAL

## 2021-12-29 MED ORDER — HEPARIN SODIUM (PORCINE) 1000 UNIT/ML IJ SOLN
INTRAMUSCULAR | Status: DC | PRN
  Administered 2021-12-29: 3000 [IU] via INTRAVENOUS
  Administered 2021-12-29: 10000 [IU] via INTRAVENOUS

## 2021-12-29 MED ORDER — HEPARIN (PORCINE) IN NACL 1000-0.9 UT/500ML-% IV SOLN
INTRAVENOUS | Status: DC | PRN
  Administered 2021-12-29 (×2): 500 mL

## 2021-12-29 MED ORDER — FENTANYL CITRATE (PF) 100 MCG/2ML IJ SOLN
INTRAMUSCULAR | Status: DC | PRN
  Administered 2021-12-29: 25 ug via INTRAVENOUS

## 2021-12-29 MED ORDER — LOSARTAN POTASSIUM 25 MG PO TABS
25.0000 mg | ORAL_TABLET | Freq: Every day | ORAL | Status: DC
  Administered 2021-12-30: 25 mg via ORAL
  Filled 2021-12-29: qty 1

## 2021-12-29 MED ORDER — SODIUM CHLORIDE 0.9 % WEIGHT BASED INFUSION
3.0000 mL/kg/h | INTRAVENOUS | Status: DC
  Administered 2021-12-29: 3 mL/kg/h via INTRAVENOUS

## 2021-12-29 MED ORDER — MORPHINE SULFATE (PF) 2 MG/ML IV SOLN
INTRAVENOUS | Status: AC
  Filled 2021-12-29: qty 1

## 2021-12-29 MED ORDER — LABETALOL HCL 5 MG/ML IV SOLN: 10.0000 mg | INTRAVENOUS | Status: DC | PRN

## 2021-12-29 MED ORDER — RANOLAZINE ER 500 MG PO TB12
1000.0000 mg | ORAL_TABLET | Freq: Two times a day (BID) | ORAL | Status: DC
  Administered 2021-12-29 – 2021-12-30 (×2): 1000 mg via ORAL
  Filled 2021-12-29 (×2): qty 2

## 2021-12-29 MED ORDER — HYDRALAZINE HCL 20 MG/ML IJ SOLN: 5.0000 mg | INTRAMUSCULAR | Status: DC | PRN

## 2021-12-29 MED ORDER — CLOPIDOGREL BISULFATE 75 MG PO TABS
75.0000 mg | ORAL_TABLET | Freq: Every day | ORAL | Status: DC
  Administered 2021-12-30: 75 mg via ORAL
  Filled 2021-12-29: qty 1

## 2021-12-29 MED ORDER — LIDOCAINE HCL (PF) 1 % IJ SOLN
INTRAMUSCULAR | Status: AC
  Filled 2021-12-29: qty 30

## 2021-12-29 MED ORDER — CILOSTAZOL 50 MG PO TABS
50.0000 mg | ORAL_TABLET | Freq: Two times a day (BID) | ORAL | Status: DC
  Administered 2021-12-29 – 2021-12-30 (×2): 50 mg via ORAL
  Filled 2021-12-29 (×3): qty 1

## 2021-12-29 MED ORDER — MIDAZOLAM HCL 2 MG/2ML IJ SOLN
INTRAMUSCULAR | Status: DC | PRN
  Administered 2021-12-29: 1 mg via INTRAVENOUS

## 2021-12-29 SURGICAL SUPPLY — 17 items
BALLN MUSTANG 4.0X40 75 (BALLOONS) ×3
BALLOON MUSTANG 4.0X40 75 (BALLOONS) IMPLANT
CATH ANGIO 5F PIGTAIL 65CM (CATHETERS) ×1 IMPLANT
DEVICE CONTINUOUS FLUSH (MISCELLANEOUS) ×1 IMPLANT
KIT ENCORE 26 ADVANTAGE (KITS) ×1 IMPLANT
KIT PV (KITS) ×3 IMPLANT
SHEATH BRITE TIP 7FR 35CM (SHEATH) ×1 IMPLANT
SHEATH PINNACLE 5F 10CM (SHEATH) ×1 IMPLANT
SHEATH PINNACLE 7F 10CM (SHEATH) ×1 IMPLANT
SHEATH PROBE COVER 6X72 (BAG) ×1 IMPLANT
STENT VIABAHN 7X39X80 VBX (Permanent Stent) ×1 IMPLANT
SYR MEDRAD MARK 7 150ML (SYRINGE) ×3 IMPLANT
TAPE VIPERTRACK RADIOPAQ (MISCELLANEOUS) IMPLANT
TAPE VIPERTRACK RADIOPAQUE (MISCELLANEOUS) ×3
TRANSDUCER W/STOPCOCK (MISCELLANEOUS) ×3 IMPLANT
TRAY PV CATH (CUSTOM PROCEDURE TRAY) ×3 IMPLANT
WIRE HITORQ VERSACORE ST 145CM (WIRE) ×1 IMPLANT

## 2021-12-29 NOTE — Interval H&P Note (Signed)
History and Physical Interval Note:  12/29/2021 9:18 AM  [name]  has presented today for surgery, with the diagnosis of pad.  The various methods of treatment have been discussed with the patient and family. After consideration of risks, benefits and other options for treatment, the patient has consented to  Procedure(s): ABDOMINAL AORTOGRAM W/LOWER EXTREMITY (N/A) as a surgical intervention.  The patient's history has been reviewed, patient examined, no change in status, stable for surgery.  I have reviewed the patient's chart and labs.  Questions were answered to the patient's satisfaction.     Quay Burow

## 2021-12-29 NOTE — Plan of Care (Signed)
  Problem: Activity: Goal: Ability to return to baseline activity level will improve Outcome: Progressing   

## 2021-12-29 NOTE — Progress Notes (Addendum)
Site area: Right groin a 7 french arterial sheath was removed  Site Prior to Removal:  Level 0  Pressure Applied For 20 MINUTES    Bedrest Beginning at 1340p X 6 hours  Manual:   Yes.    Patient Status During Pull:  stable  Post Pull Groin Site:  Level 0  Post Pull Instructions Given:  Yes.    Post Pull Pulses Present:  Yes.    Dressing Applied:  Yes.    Comments:

## 2021-12-29 NOTE — Plan of Care (Signed)
  Problem: Cardiovascular: Goal: Ability to achieve and maintain adequate cardiovascular perfusion will improve Outcome: Progressing Goal: Vascular access site(s) Level 0-1 will be maintained Outcome: Progressing   

## 2021-12-30 ENCOUNTER — Other Ambulatory Visit (HOSPITAL_COMMUNITY): Payer: Self-pay

## 2021-12-30 ENCOUNTER — Encounter (HOSPITAL_COMMUNITY): Payer: Self-pay | Admitting: Cardiovascular Disease

## 2021-12-30 DIAGNOSIS — I739 Peripheral vascular disease, unspecified: Secondary | ICD-10-CM

## 2021-12-30 DIAGNOSIS — E1151 Type 2 diabetes mellitus with diabetic peripheral angiopathy without gangrene: Secondary | ICD-10-CM | POA: Diagnosis not present

## 2021-12-30 LAB — BASIC METABOLIC PANEL
Anion gap: 6 (ref 5–15)
BUN: 11 mg/dL (ref 6–20)
CO2: 23 mmol/L (ref 22–32)
Calcium: 8.4 mg/dL — ABNORMAL LOW (ref 8.9–10.3)
Chloride: 108 mmol/L (ref 98–111)
Creatinine, Ser: 0.62 mg/dL (ref 0.44–1.00)
GFR, Estimated: >60 mL/min (ref >60)
Glucose, Bld: 149 mg/dL — ABNORMAL HIGH (ref 70–99)
Potassium: 4.1 mmol/L (ref 3.5–5.1)
Sodium: 137 mmol/L (ref 135–145)

## 2021-12-30 LAB — CBC
HCT: 38.9 % (ref 36.0–46.0)
Hemoglobin: 12.1 g/dL (ref 12.0–15.0)
MCH: 27.9 pg (ref 26.0–34.0)
MCHC: 31.1 g/dL (ref 30.0–36.0)
MCV: 89.6 fL (ref 80.0–100.0)
Platelets: 159 10*3/uL (ref 150–400)
RBC: 4.34 MIL/uL (ref 3.87–5.11)
RDW: 14.3 % (ref 11.5–15.5)
WBC: 11.9 10*3/uL — ABNORMAL HIGH (ref 4.0–10.5)
nRBC: 0 % (ref 0.0–0.2)

## 2021-12-30 MED ORDER — METFORMIN HCL ER 500 MG PO TB24
1000.0000 mg | ORAL_TABLET | Freq: Two times a day (BID) | ORAL | Status: AC
Start: 2021-12-31 — End: ?

## 2021-12-30 MED ORDER — CLOPIDOGREL BISULFATE 75 MG PO TABS
75.0000 mg | ORAL_TABLET | Freq: Every day | ORAL | 3 refills | Status: DC
  Filled 2021-12-30: qty 30, 30d supply, fill #0

## 2021-12-30 NOTE — Progress Notes (Signed)
Discharge instructions, Rx's and follow up appts explained and provided to patient verbalized understanding. Discharge medication delivered to room by [place]. Patient left floor via wheelchair accompanied by staff no c/o pain or shortness of breath.  Donald Jacque, Tivis Ringer, RN

## 2021-12-30 NOTE — Discharge Summary (Addendum)
Discharge Summary    Donna ID: [name] MRN: [id number]; DOB: 08-Jul-1976  Admit date: 12/29/2021 Discharge date: 12/30/2021  PCP:  Barnetta Chapel, NP   [facility] Providers Cardiologist:  Jenne Campus, MD   { PV: Dr. Gwenlyn Found    Discharge Diagnoses    Principal Problem:   Claudication in peripheral vascular disease ([place])   CAD   HTN   HLD   DM  Diagnostic Studies/Procedures    ABDOMINAL AORTOGRAM W/LOWER EXTREMITY  PERIPHERAL VASCULAR INTERVENTION   Procedures Performed:             1.  Ultrasound-guided right common femoral access             2.  Abdominal aortogram/bilateral iliac angiogram/bifemoral runoff             3.  PTA and covered stent right common iliac artery   Angiographic Data:    1: Abdominal aorta-widely patent renal arteries bilaterally.  The infrarenal abdominal aorta was free of significant atherosclerotic disease 2: Left lower extremity-95% fairly focal distal left common femoral artery stenosis at the femoral head.  There was 30 to 40% segmental mid left SFA with two-vessel runoff and was occluded anterior tibial 3: Right lower extremity-99% stenosis proximal right common iliac artery.  Occluded right SFA at the origin reconstituting in the adductor canal by profunda femoris collaterals with three-vessel runoff   IMPRESSION: [name] has a high-grade proximal right common iliac artery stenosis along with total right SFA and high-grade distal left common femoral artery stenosis.  We will proceed with right common iliac artery PTA and covered stenting.   Procedure Description: The 5 French sheath was exchanged over an 035 versa core wire for a 7 [place] Brite tip sheath.  The Donna received a total of 13,000 units of heparin with an ACT at the end of 275.  Isovue dye was used for the entirety of the intervention.  Retroaortic pressures moderate during the case.  The lesion was predilated with a 4 mm x 4 cm balloon and stented with a 7 mm x  39 mm long VBX covered stent deployed at 12 atm resulting reduction of a 99% proximal right common iliac artery stenosis to 0% residual.  The Donna taught the procedure well.  The Brite tip sheath was then exchanged over the 035 wire for a short 7 French sheath which was then secured in place.  The guidewire was removed.  The Donna did receive 300 mg of p.o. Plavix.   Final Impression: Successful right common iliac artery PTA and covered stenting using a 7 mm VBX stent.  Donna has residual right SFA occlusion probably not percutaneously addressable and distal left common femoral artery stenosis over the femoral head which may require endarterectomy and patch angioplasty.  The sheath will be removed once ACT falls below 170 and pressure held.  [name] will be hydrated overnight and discharged home in the morning on aspirin and Plavix.  We will obtain lower extremity arterial Doppler studies in our [facility] line office next week and I will see her back the week after for follow-up.   History of Present Illness     [name] is a 46 y.o. female with CAD s/p CABG, HTN, HLD, DM and PAD presented for PV procedure.   Donna is status post coronary artery bypass and graft in 2017 with a LIMA to the LAD, saphenous vein graft to the right coronary artery.  Cardiac catheterization June 2021 showed  occluded LAD, 70% marginal, occluded right coronary artery.  Saphenous vein graft to the right coronary artery was occluded and LIMA to the LAD was patent.  RCA was noted to have some collaterals. Medical therapy recommended. Echocardiogram June 2021 showed ejection fraction 40 to 45%, mild left ventricular enlargement, mild left ventricular hypertrophy, grade 2 diastolic dysfunction, moderate left atrial enlargement, mild mitral regurgitation.   [name] is complained of right greater than left lower extremity claudication for the last year.  [name] did have recent Dopplers that ruled out DVT but suggested SFA disease  bilaterally. Lower extremity arterial Doppler studies on 12/16/2021 revealing right ABI of 0.45 and a left of 0.65.  [name] had a high-frequency signal at the origin of her right common iliac artery, occluded right SFA and high-grade distal left common femoral artery stenosis. [name] wishes to proceed with endovascular therapy.   Hospital Course     Consultants: None   The Donna underwent successful right common iliac artery PTA and covered stenting using a 7 mm VBX stent.  Donna has residual right SFA occlusion probably not percutaneously addressable and distal left common femoral artery stenosis over the femoral head which may require endarterectomy and patch angioplasty. No overnight complications. Hydration given. Renal function normal. No hematoma. Continue ASA, Plavix and Pletal (can be discontinued in outpatient setting).   The Donna been seen by Dr. Ali Lowe today and deemed ready for discharge home. All follow-up appointments have been scheduled. Discharge medications are listed below.    Did the Donna have an acute coronary syndrome (MI, NSTEMI, STEMI, etc) this admission?:  No                               Did the Donna have a percutaneous coronary intervention (stent / angioplasty)?:  No.    Discharge Vitals Blood pressure (!) 123/57, pulse 74, temperature 98.1 F (36.7 C), temperature source Oral, resp. rate 18, height 5' 7" (1.702 m), weight 94.3 kg, SpO2 98 %.  Filed Weights   12/29/21 0746  Weight: 94.3 kg   Physical Exam Constitutional:      Appearance: Normal appearance.  HENT:     Head: Normocephalic.     Nose: Nose normal.  Eyes:     Pupils: Pupils are equal, round, and reactive to light.  Cardiovascular:     Rate and Rhythm: Normal rate and regular rhythm.     Comments: Groin site without hematoma or ecchymosis  Pulmonary:     Effort: Pulmonary effort is normal.     Breath sounds: Normal breath sounds.  Abdominal:     General: Abdomen is flat.      Palpations: Abdomen is soft.  Musculoskeletal:        General: Normal range of motion.     Cervical back: Normal range of motion.  Skin:    General: Skin is warm and dry.  Neurological:     General: No focal deficit present.     Mental Status: [name] is alert and oriented to person, place, and time.  Psychiatric:        Mood and Affect: Mood normal.        Behavior: Behavior normal.    Labs & Radiologic Studies    CBC Recent Labs    12/30/21 0033  WBC 11.9*  HGB 12.1  HCT 38.9  MCV 89.6  PLT 161   Basic Metabolic Panel Recent Labs    12/30/21 0033  NA 137  K 4.1  CL 108  CO2 23  GLUCOSE 149*  BUN 11  CREATININE 0.62  CALCIUM 8.4*   _____________  PERIPHERAL VASCULAR CATHETERIZATION  Result Date: 12/29/2021 Images from the original result were not included.  [id number] LOCATION:  FACILITY: [facility] PHYSICIAN: Quay Burow, M.D. [birth year] DATE OF PROCEDURE:  12/29/2021 DATE OF DISCHARGE: PV Angiogram/Intervention History obtained from chart review.[name] is a 46 y.o.  moderately overweight divorced Caucasian female mother of 73, grandmother of 2 grandchildren who works as a Designer, jewellery at an assisted living facility.  [name] was referred by Dr. Agustin Cree, her cardiologist, for evaluation of lifestyle limiting claudication/PAD.  I last saw her in the office 12/02/2021.  Risk factors include 30 to 50 pack years of tobacco abuse continuing to smoke 1 to 1-1/2 packs a day.  [name] has treated hypertension, hyperlipidemia and diabetes.  [name] has had a heart attack in the past but no stroke.  [name] had bypass surgery in 2017 and had cardiac catheterization performed by Dr. Martinique June 2021 with medical treatment recommended.  [name] gets occasional nitrate risks responsive chest pain.  [name] is complained of right greater than left lower extremity claudication for the last year.  [name] did have recent Dopplers that ruled out DVT but suggested SFA disease bilaterally. I obtained lower extremity  arterial Doppler studies on 12/16/2021 revealing right ABI of 0.45 and a left of 0.65.  [name] had a high-frequency signal at the origin of her right common iliac artery, occluded right SFA and high-grade distal left common femoral artery stenosis.  [name] has right greater than left lower extremity claudication which has been lifestyle limiting and progressive for over a year.  [name] wishes to proceed with endovascular therapy. Pre Procedure Diagnosis: Peripheral arterial disease Post Procedure Diagnosis: Peripheral arterial disease Operators: Dr. Quay Burow Procedures Performed:  1.  Ultrasound-guided right common femoral access  2.  Abdominal aortogram/bilateral iliac angiogram/bifemoral runoff  3.  PTA and covered stent right common iliac artery  PROCEDURE DESCRIPTION: The Donna was brought to the second floor [place] [facility]iac cath lab in the the postabsorptive state. [name] was premedicated with IV Versed and fentanyl. Her right was prepped and shaved in usual sterile fashion. Xylocaine 1% was used for local anesthesia. A 5 French sheath was inserted into the right common femoral artery using standard Seldinger technique.  Ultrasound was used to identify the right common femoral artery and guide access.  Digital image was captured and placed the Donna's chart.  A 5 French pigtail catheter was placed in the distal abdominal aorta.  Distal abdominal aortography, bilateral iliac angiography with bifemoral runoff was performed using bolus chase, digital subtraction and step table technique.  Isovue dye is used for the entirety of the case (total contrast the Donna 185 cc).  Retrograde aortic pressures monitored during the case. Angiographic Data: 1: Abdominal aorta-widely patent renal arteries bilaterally.  The infrarenal abdominal aorta was free of significant atherosclerotic disease 2: Left lower extremity-95% fairly focal distal left common femoral artery stenosis at the femoral head.  There was 30 to 40%  segmental mid left SFA with two-vessel runoff and was occluded anterior tibial 3: Right lower extremity-99% stenosis proximal right common iliac artery.  Occluded right SFA at the origin reconstituting in the adductor canal by profunda femoris collaterals with three-vessel runoff   [name] has a high-grade proximal right common iliac artery stenosis along with total right SFA and high-grade distal left common femoral artery stenosis.  We will proceed with right common iliac artery PTA and covered stenting. Procedure Description: The 5 French sheath was exchanged over an 035 versa core wire for a 7 [place] Brite tip sheath.  The Donna received a total of 13,000 units of heparin with an ACT at the end of 275.  Isovue dye was used for the entirety of the intervention.  Retroaortic pressures moderate during the case. The lesion was predilated with a 4 mm x 4 cm balloon and stented with a 7 mm x 39 mm long VBX covered stent deployed at 12 atm resulting reduction of a 99% proximal right common iliac artery stenosis to 0% residual.  The Donna taught the procedure well.  The Brite tip sheath was then exchanged over the 035 wire for a short 7 French sheath which was then secured in place.  The guidewire was removed.  The Donna did receive 300 mg of p.o. Plavix. Final Impression: Successful right common iliac artery PTA and covered stenting using a 7 mm VBX stent.  Donna has residual right SFA occlusion probably not percutaneously addressable and distal left common femoral artery stenosis over the femoral head which may require endarterectomy and patch angioplasty.  The sheath will be removed once ACT falls below 170 and pressure held.  [name] will be hydrated overnight and discharged home in the morning on aspirin and Plavix.  We will obtain lower extremity arterial Doppler studies in our [facility] line office next week and I will see her back the week after for follow-up. Quay Burow. MD, [facility] 12/29/2021 10:28 AM     LE ART SEG MULTI (Segm & LE Reynauds)  Result Date: 12/17/2021  LOWER EXTREMITY DOPPLER STUDY Donna Name:  MAYANNA Tate  Date of Exam:   12/16/2021 Medical Rec #: [id number]     Accession #:    [id number] Date of Birth: 06/05/1976    Donna Gender: F Donna Age:   24 years Exam Location:  [facility] Procedure:      VAS [place] LOWER EXT ART SEG MULTI (SEGMENTALS & LE RAYNAUDS) Referring Phys: Roderic Palau BERRY --------------------------------------------------------------------------------  Indications: Claudication, peripheral artery disease, and Donna reports              worsening bilateral calf claudication symptoms after walking a              short distance x 1 year. [name] states [name] may could walk from the              lobby to the vascular department twice before having to stop and              rest. [name] also has a h/o Covid-19. High Risk Factors: Hypertension, hyperlipidemia, Diabetes, current smoker, prior                    MI, coronary artery disease.  Comparison Study: NA Performing Technologist: Sharlett Iles RVT  Examination Guidelines: A complete evaluation includes at minimum, Doppler waveform signals and systolic blood pressure reading at the level of bilateral brachial, anterior tibial, and posterior tibial arteries, when vessel segments are accessible. Bilateral testing is considered an integral part of a complete examination. Photoelectric Plethysmograph (PPG) waveforms and toe systolic pressure readings are included as required and additional duplex testing as needed. Limited examinations for reoccurring indications may be performed as noted.  ABI Findings: +---------+------------------+-----+----------+--------+  Right     Rt Pressure (mmHg) Index Waveform   Comment   +---------+------------------+-----+----------+--------+  Brachial  155                                           +---------+------------------+-----+----------+--------+  CFA                                monophasic            +---------+------------------+-----+----------+--------+  Popliteal                          monophasic           +---------+------------------+-----+----------+--------+  PTA       68                 0.44  monophasic           +---------+------------------+-----+----------+--------+  PERO      67                       monophasic .43       +---------+------------------+-----+----------+--------+  DP        69                 0.45  monophasic           +---------+------------------+-----+----------+--------+  Great Toe 62                 0.40  Abnormal             +---------+------------------+-----+----------+--------+ +---------+------------------+-----+----------------------+-------+  Left      Lt Pressure (mmHg) Index Waveform               Comment  +---------+------------------+-----+----------------------+-------+  Brachial  147                                                      +---------+------------------+-----+----------------------+-------+  CFA                                monophasic                      +---------+------------------+-----+----------------------+-------+  Popliteal                          biphasic                        +---------+------------------+-----+----------------------+-------+  PTA       93                 0.60  biphasic                        +---------+------------------+-----+----------------------+-------+  PERO      99                       biphasic to monophasic .64      +---------+------------------+-----+----------------------+-------+  DP        101                0.65  biphasic                        +---------+------------------+-----+----------------------+-------+  Great Toe 83                 0.[place]  Abnormal                        +---------+------------------+-----+----------------------+-------+ +-------+-----------+-----------+------------+------------+  ABI/TBI Today's ABI Today's TBI Previous ABI Previous TBI   +-------+-----------+-----------+------------+------------+  Right   .45         .40                                    +-------+-----------+-----------+------------+------------+  Left    .65         .54                                    +-------+-----------+-----------+------------+------------+   Summary: Right: Resting right ankle-brachial index indicates severe right lower extremity arterial disease. The right toe-brachial index is abnormal. Left: Resting left ankle-brachial index indicates moderate left lower extremity arterial disease. The left toe-brachial index is abnormal.  *See table(s) above for measurements and observations. See ABI report. Vascular consult recommended. Scheduled to see Dr. Gwenlyn Found on 12/26/2021 Electronically signed by Jenkins Rouge MD on 12/17/2021 at 10:00:14 AM.    Final    VAS [place] LOWER EXTREMITY ARTERIAL DUPLEX  Result Date: 12/17/2021 LOWER EXTREMITY ARTERIAL DUPLEX STUDY Donna Name:  AMIEE Tate  Date of Exam:   12/16/2021 Medical Rec #: [id number]     Accession #:    [id number] Date of Birth: 24-Apr-1976    Donna Gender: F Donna Age:   71 years Exam Location:  [facility] Procedure:      VAS [place] LOWER EXTREMITY ARTERIAL DUPLEX Referring Phys: Roderic Palau BERRY --------------------------------------------------------------------------------  Indications: Claudication, peripheral artery disease, and Donna reports              worsening bilateral calf claudication symptoms after walking a              short distance x 1 year. [name] states [name] may could walk from the              lobby to the vascular department twice before having to stop and              rest. [name] also has a h/o Covid-19. High Risk Factors: Hypertension, hyperlipidemia, Diabetes, current smoker, prior                    MI, coronary artery disease.  Current ABI: .45 on the right and .65 on the left Comparison Study: NA Performing Technologist: Sharlett Iles RVT  Examination Guidelines: A complete evaluation  includes B-mode imaging, spectral Doppler, color Doppler, and power Doppler as needed of all accessible portions of each vessel. Bilateral testing is considered an integral part of a complete examination. Limited examinations for reoccurring indications may be performed as noted. Aorta: +--------+-------+----------+----------+--------+--------+-----+           AP (cm) Trans (cm) PSV (cm/s) Waveform Thrombus Shape  +--------+-------+----------+----------+--------+--------+-----+  Proximal 2.10    2.10       159                                 +--------+-------+----------+----------+--------+--------+-----+  Mid                         88                                  +--------+-------+----------+----------+--------+--------+-----+  Distal                      134                                 +--------+-------+----------+----------+--------+--------+-----+ Aortic atherosclerosis without evidence of focal stenosis.  +-----------+--------+-----+---------------+----------+------------------------+  RIGHT       PSV cm/s Ratio Stenosis        Waveform   Comments                  +-----------+--------+-----+---------------+----------+------------------------+  CIA Prox    794            75-99%stenosis  monophasic just past the ostium,                                                            turbulent                 +-----------+--------+-----+---------------+----------+------------------------+  CIA Mid     235            50-74% stenosis monophasic turbulent                 +-----------+--------+-----+---------------+----------+------------------------+  CIA Distal  139                            monophasic turbulent                 +-----------+--------+-----+---------------+----------+------------------------+  EIA Prox    71                             monophasic                           +-----------+--------+-----+---------------+----------+------------------------+  EIA Mid     58                              monophasic                           +-----------+--------+-----+---------------+----------+------------------------+  EIA Distal  53                             monophasic                           +-----------+--------+-----+---------------+----------+------------------------+  CFA Prox    69                             monophasic                           +-----------+--------+-----+---------------+----------+------------------------+  DFA         89                             monophasic                           +-----------+--------+-----+---------------+----------+------------------------+  SFA Prox    0              occluded                                             +-----------+--------+-----+---------------+----------+------------------------+  SFA Mid     0              occluded                   reconstitutes in the mid                                                         segment                   +-----------+--------+-----+---------------+----------+------------------------+  SFA Distal  27                             monophasic                           +-----------+--------+-----+---------------+----------+------------------------+  POP Prox    31                             monophasic                           +-----------+--------+-----+---------------+----------+------------------------+  POP Mid     31                             monophasic                           +-----------+--------+-----+---------------+----------+------------------------+  POP Distal  28                             monophasic                           +-----------+--------+-----+---------------+----------+------------------------+  TP Trunk                                   monophasic                           +-----------+--------+-----+---------------+----------+------------------------+  ATA Distal  24                             monophasic                            +-----------+--------+-----+---------------+----------+------------------------+  PTA Distal  21                             monophasic                           +-----------+--------+-----+---------------+----------+------------------------+  PERO Distal 14                             monophasic                           +-----------+--------+-----+---------------+----------+------------------------+  +-----------+--------+-----+---------------+---------------+-------------------+  LEFT        PSV cm/s Ratio Stenosis        Waveform        Comments             +-----------+--------+-----+---------------+---------------+-------------------+  CIA Prox    131                            monophasic      brisk                +-----------+--------+-----+---------------+---------------+-------------------+  CIA Mid     139                            monophasic      brisk                +-----------+--------+-----+---------------+---------------+-------------------+  CIA Distal  159                            monophasic      brisk                +-----------+--------+-----+---------------+---------------+-------------------+  EIA Prox    123                            monophasic      brisk                +-----------+--------+-----+---------------+---------------+-------------------+  EIA Mid     76                             monophasic      dampened             +-----------+--------+-----+---------------+---------------+-------------------+  EIA Distal  89                             monophasic      dampened             +-----------+--------+-----+---------------+---------------+-------------------+  CFA Prox    69                             monophasic                           +-----------+--------+-----+---------------+---------------+-------------------+  CFA Mid     520      7.5   75-99% stenosis monophasic                           +-----------+--------+-----+---------------+---------------+-------------------+  CFA  Distal  117                            monophasic      bi-directional flow  +-----------+--------+-----+---------------+---------------+-------------------+  DFA  275                            monophasic      turbulent            +-----------+--------+-----+---------------+---------------+-------------------+  SFA Prox    179                            monophasic      turbulent            +-----------+--------+-----+---------------+---------------+-------------------+  SFA Mid     101                            nearly                                                                           triphasic                            +-----------+--------+-----+---------------+---------------+-------------------+  SFA Distal  55                             monophasic      brisk                +-----------+--------+-----+---------------+---------------+-------------------+  POP Prox    53                             monophasic      brisk                +-----------+--------+-----+---------------+---------------+-------------------+  POP Mid     62                             biphasic                             +-----------+--------+-----+---------------+---------------+-------------------+  POP Distal  47                             biphasic                             +-----------+--------+-----+---------------+---------------+-------------------+  TP Trunk                                   biphasic to                                                                      monophasic                           +-----------+--------+-----+---------------+---------------+-------------------+  ATA Distal  39                             biphasic                             +-----------+--------+-----+---------------+---------------+-------------------+  PTA Distal  33                             monophasic      brisk                +-----------+--------+-----+---------------+---------------+-------------------+  PERO  Distal 30                             brisk                                +-----------+--------+-----+---------------+---------------+-------------------+ A focal velocity elevation of 520 cm/s was obtained at mid CFA with post stenotic turbulence with a VR of 7.5. Findings are characteristic of 75-99% stenosis.  Summary: Right: Heterogeneous plaque throughout. 75-99% stenosis in the proximal common iliac artery just past the ostium. 50-74% stenosis in the mid common iliac artery. Totally occluded ostial SFA to mid SFA with immediate reconstitution of flow in the mid SFA via collateral.  Left: Heterogeneous plaque throughout. 75-99% stenosis in the mid CFA.  See table(s) above for measurements and observations. See ABI report. Vascular consult recommended. Scheduled to see Dr. Gwenlyn Found on 12/26/2021. Electronically signed by Jenkins Rouge MD on 12/17/2021 at 10:05:59 AM.    Final    Disposition   Pt is being discharged home today in good condition.  Follow-up Plans & Appointments   Has been arranged  Discharge Instructions     Diet - low sodium heart healthy   Complete by: As directed    Discharge instructions   Complete by: As directed    No driving for 48 hours. No lifting over 5 lbs for 1 week. No sexual activity for 1 week. You may return to work on 01/05/22. Keep procedure site clean & dry. If you notice increased pain, swelling, bleeding or pus, call/return!  You may shower, but no soaking baths/hot tubs/pools for 1 week.   Hold metformin for two days. Resume on Thursday.   Increase activity slowly   Complete by: As directed       Discharge Medications   Allergies as of 12/30/2021   No Known Allergies      Medication List     TAKE these medications    aspirin EC 81 MG tablet Take 1 tablet (81 mg total) by mouth daily. Swallow whole.   atorvastatin 80 MG tablet Commonly known as: LIPITOR Take 1 tablet (80 mg total) by mouth daily. What changed: when to take this   busPIRone  15 MG tablet Commonly known as: BUSPAR Take 15 mg by mouth 2 (two) times daily.   carvedilol 12.5 MG tablet Commonly known as: Coreg Take 1 tablet (12.5 mg total) by mouth 2 (two) times daily with a meal.   cilostazol 50 MG tablet Commonly known as: PLETAL Take 1 tablet (50 mg total) by mouth 2 (two) times daily.   clopidogrel 75 MG tablet Commonly known as: PLAVIX Take 1 tablet (75 mg total) by mouth daily with breakfast.  cyanocobalamin 1000 MCG/ML injection Commonly known as: (VITAMIN B-12) Inject 1,000 mcg into the muscle every 30 (thirty) days.   D3 5000 125 MCG (5000 UT) capsule Generic drug: Cholecalciferol Take 5,000 Units by mouth every morning.   dapagliflozin propanediol 10 MG Tabs tablet Commonly known as: FARXIGA Take 10 mg by mouth every morning.   gabapentin 300 MG capsule Commonly known as: NEURONTIN Take 600 mg by mouth 2 (two) times daily.   isosorbide mononitrate 60 MG 24 hr tablet Commonly known as: IMDUR TAKE 1 TABLET BY MOUTH EVERY DAY What changed: when to take this   losartan 25 MG tablet Commonly known as: COZAAR Take 1 tablet by mouth once daily What changed: when to take this   metFORMIN 500 MG 24 hr tablet Commonly known as: GLUCOPHAGE-XR Take 2 tablets (1,000 mg total) by mouth 2 (two) times daily. Start taking on: December 31, 2021   nitroGLYCERIN 0.4 MG SL tablet Commonly known as: NITROSTAT Place 1 tablet (0.4 mg total) under the tongue every 5 (five) minutes as needed. What changed: reasons to take this   ranolazine 1000 MG SR tablet Commonly known as: Ranexa Take 1 tablet (1,000 mg total) by mouth 2 (two) times daily.        Outstanding Labs/Studies   LE doppler   Duration of Discharge Encounter   Greater than 30 minutes including physician time.  SignedLeanor Kail, PA 12/30/2021, 8:15 AM  ATTENDING ATTESTATION:  After conducting a review of all available clinical information with the care team,  interviewing the Donna, and performing a physical exam, I agree with the findings and plan described in this note.   GEN: No acute distress.   Cardiac: RRR, no murmurs, rubs, or gallops.  Respiratory: Clear to auscultation bilaterally. GI: Soft, nontender, non-distended  MS: No edema; No deformity. 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  Access site stable and ambulating without issues.  Discharge today with follow up as scheduled.  Lenna Sciara, MD Pager [phone]

## 2021-12-30 NOTE — Plan of Care (Signed)
°  Problem: Activity: Goal: Ability to return to baseline activity level will improve Outcome: Adequate for Discharge   Problem: Cardiovascular: Goal: Ability to achieve and maintain adequate cardiovascular perfusion will improve Outcome: Adequate for Discharge Goal: Vascular access site(s) Level 0-1 will be maintained Outcome: Adequate for Discharge

## 2021-12-31 ENCOUNTER — Encounter: Payer: Self-pay | Admitting: Cardiovascular Disease

## 2021-12-31 DIAGNOSIS — I739 Peripheral vascular disease, unspecified: Secondary | ICD-10-CM

## 2022-01-06 ENCOUNTER — Ambulatory Visit (HOSPITAL_COMMUNITY)
Admission: RE | Admit: 2022-01-06 | Payer: PRIVATE HEALTH INSURANCE | Source: Ambulatory Visit | Attending: Cardiovascular Disease | Admitting: Cardiovascular Disease

## 2022-01-06 ENCOUNTER — Other Ambulatory Visit (HOSPITAL_COMMUNITY): Payer: Self-pay | Admitting: Cardiovascular Disease

## 2022-01-06 ENCOUNTER — Ambulatory Visit (HOSPITAL_COMMUNITY)
Admission: RE | Admit: 2022-01-06 | Discharge: 2022-01-06 | Disposition: A | Discharge status: Home / Self Care | Payer: PRIVATE HEALTH INSURANCE | Source: Ambulatory Visit | Attending: Internal Medicine | Admitting: Internal Medicine

## 2022-01-06 ENCOUNTER — Ambulatory Visit (HOSPITAL_BASED_OUTPATIENT_CLINIC_OR_DEPARTMENT_OTHER)
Admission: RE | Admit: 2022-01-06 | Discharge: 2022-01-06 | Disposition: A | Discharge status: Home / Self Care | Payer: PRIVATE HEALTH INSURANCE | Source: Ambulatory Visit | Attending: Internal Medicine | Admitting: Internal Medicine

## 2022-01-06 ENCOUNTER — Other Ambulatory Visit: Payer: Self-pay

## 2022-01-06 DIAGNOSIS — Z95820 Peripheral vascular angioplasty status with implants and grafts: Secondary | ICD-10-CM

## 2022-01-06 DIAGNOSIS — Z95828 Presence of other vascular implants and grafts: Secondary | ICD-10-CM

## 2022-01-06 DIAGNOSIS — I739 Peripheral vascular disease, unspecified: Secondary | ICD-10-CM

## 2022-01-07 ENCOUNTER — Other Ambulatory Visit: Payer: Self-pay | Admitting: Cardiology

## 2022-01-08 ENCOUNTER — Other Ambulatory Visit (HOSPITAL_BASED_OUTPATIENT_CLINIC_OR_DEPARTMENT_OTHER): Payer: Self-pay

## 2022-01-08 NOTE — Telephone Encounter (Signed)
Losartan 25 mg # 90 x 1 refill sent to  [facility], [place]

## 2022-01-14 ENCOUNTER — Other Ambulatory Visit: Payer: Self-pay

## 2022-01-14 ENCOUNTER — Ambulatory Visit (INDEPENDENT_AMBULATORY_CARE_PROVIDER_SITE_OTHER): Payer: PRIVATE HEALTH INSURANCE | Admitting: Cardiovascular Disease

## 2022-01-14 ENCOUNTER — Encounter: Payer: Self-pay | Admitting: Cardiovascular Disease

## 2022-01-14 VITALS — BP 138/72 | HR 94 | Ht 67.0 in | Wt 211.8 lb

## 2022-01-14 DIAGNOSIS — R0989 Other specified symptoms and signs involving the circulatory and respiratory systems: Secondary | ICD-10-CM

## 2022-01-14 NOTE — Assessment & Plan Note (Signed)
[name] returns today for follow-up of her recent peripheral vascular procedure which I performed on 12/29/2021.  She had a subtotally occluded ostial right common iliac artery which I stented.  She has residual total right SFA occlusion from the origin down to the adductor canal with three-vessel runoff.  She also had a 95% distal left common femoral artery stenosis with two-vessel runoff.  Her claudication has markedly improved.  She has no symptoms on the left side.  Her Dopplers show a patent right common iliac artery with improvement of her right ABI from 0.45-0.77.  She does continue to smoke unfortunately.  We will repeat lower extremity arterial Doppler studies in 6 months after which I will see her back in follow-up.  If she develops left lower extremity claudication she could have intervention on her left common femoral artery.

## 2022-01-14 NOTE — Patient Instructions (Signed)
Medication Instructions:  Your physician recommends that you continue on your current medications as directed. Please refer to the Current Medication list given to you today.  *If you need a refill on your cardiac medications before your next appointment, please call your pharmacy*   Testing/Procedures: Your physician has requested that you have a carotid duplex. This test is an ultrasound of the carotid arteries in your neck. It looks at blood flow through these arteries that supply the brain with blood. Allow one hour for this exam. There are no restrictions or special instructions. This procedure is done at [facility].   Dr. Gwenlyn Found has recommended that you have an Ultrasound of your AORTA/IVC/ILIACS.   To prepare for this test:  No food after 11PM the night before. Water is OK. (Don't drink liquids if you have been instructed not to for ANOTHER test).  Avoid foods that produce bowel gas, for 24 hours prior to exam (see below). No breakfast, no chewing gum, no smoking or carbonated beverages. Patient may take morning medications with water. Come in for test at least 15 minutes early to register.  Your physician has requested that you have an ankle brachial index (ABI). During this test an ultrasound and blood pressure cuff are used to evaluate the arteries that supply the arms and legs with blood. Allow thirty minutes for this exam. There are no restrictions or special instructions. To be done in July. This procedure is done at [facility].    Follow-Up: At [facility], you and your health needs are our priority.  As part of our continuing mission to provide you with exceptional heart care, we have created designated Provider Care Teams.  These Care Teams include your primary Cardiologist (physician) and Advanced Practice Providers (APPs -  Physician Assistants and Nurse Practitioners) who all work together to provide you with the care you need, when you need it.  We  recommend signing up for the patient portal called "MyChart".  Sign up information is provided on this After Visit Summary.  MyChart is used to connect with patients for Virtual Visits (Telemedicine).  Patients are able to view lab/test results, encounter notes, upcoming appointments, etc.  Non-urgent messages can be sent to your provider as well.   To learn more about what you can do with MyChart, go to [email or web address].    Your next appointment:   6 month(s)  The format for your next appointment:   In Person  Provider:   Quay Burow, MD

## 2022-01-14 NOTE — Progress Notes (Signed)
01/14/2022 [name]   [birth year]  [id number]  Primary Physician Donna Chapel, NP Primary Cardiologist: Donna Harp MD [name], [place]  HPI:  [name] is a 46 y.o.  moderately overweight divorced Caucasian female mother of 68, grandmother of 2 grandchildren who works as a Designer, jewellery at an assisted living facility.  She was referred by Dr. Agustin Tate, her cardiologist, for evaluation of lifestyle limiting claudication/PAD.  I last saw her in the office 12/26/2021.  Risk factors include 30 to 50 pack years of tobacco abuse continuing to smoke 1 to 1-1/2 packs a day.  She has treated hypertension, hyperlipidemia and diabetes.  She has had a heart attack in the past but no stroke.  She had bypass surgery in 2017 and had cardiac catheterization performed by Dr. Martinique Tate 2021 with medical treatment recommended.  She gets occasional nitrate risks responsive chest pain.  She is complained of right greater than left lower extremity claudication for the last year.  She did have recent Dopplers that ruled out DVT but suggested SFA disease bilaterally.  I obtained lower extremity arterial Doppler studies on 12/16/2021 revealing right ABI of 0.45 and a left of 0.65.  She had a high-frequency signal at the origin of her right common iliac artery, occluded right SFA and high-grade distal left common femoral artery stenosis.  She has right greater than left lower extremity claudication which has been lifestyle limiting and progressive for over a year.  She wishes to proceed with endovascular therapy.  I performed peripheral angiography on her 12/29/2021 revealing a subtotally occluded right common iliac artery which I stented using a 7 mm x 39 mm long VBX covered stent.  She did have a totally occluded right SFA from the origin down to the adductor canal with three-vessel runoff as well as a 95% distal left common femoral artery stenosis with two-vessel runoff.  Her claudication symptoms  have markedly improved as has her left ABI from 0.45 up to 0.77.   No outpatient medications have been marked as taking for the 01/14/22 encounter (Office Visit) with Donna Harp, MD.     No Known Allergies  Social History   Socioeconomic History   Marital status: Single    Spouse name: Not on file   Number of children: Not on file   Years of education: Not on file   Highest education level: Not on file  Occupational History   Not on file  Tobacco Use   Smoking status: Every Day    Packs/day: 0.50    Years: 21.00    Pack years: 10.50    Types: Cigarettes   Smokeless tobacco: Never  Substance and Sexual Activity   Alcohol use: Yes    Comment: rare social   Drug use: No    Comment: x1 episode "Meth" use.   Sexual activity: Not Currently    Birth control/protection: None  Other Topics Concern   Not on file  Social History Narrative   Not on file   Social Determinants of Health   Financial Resource Strain: Not on file  Food Insecurity: Not on file  Transportation Needs: Not on file  Physical Activity: Not on file  Stress: Not on file  Social Connections: Not on file  Intimate Partner Violence: Not on file     Review of Systems: General: negative for chills, fever, night sweats or weight changes.  Cardiovascular: negative for chest pain, dyspnea on exertion, edema, orthopnea, palpitations, paroxysmal nocturnal  dyspnea or shortness of breath Dermatological: negative for rash Respiratory: negative for cough or wheezing Urologic: negative for hematuria Abdominal: negative for nausea, vomiting, diarrhea, bright red blood per rectum, melena, or hematemesis Neurologic: negative for visual changes, syncope, or dizziness All other systems reviewed and are otherwise negative except as noted above.    Blood pressure 138/72, pulse 94, height 5\' 7"  (1.702 m), weight 211 lb 12.8 oz (96.1 kg), SpO2 98 %.  General appearance: alert and no distress Neck: no adenopathy,  no JVD, supple, symmetrical, trachea midline, thyroid not enlarged, symmetric, no tenderness/mass/nodules, and soft right carotid bruit Lungs: clear to auscultation bilaterally Heart: regular rate and rhythm, S1, S2 normal, no murmur, click, rub or gallop Extremities: extremities normal, atraumatic, no cyanosis or edema Pulses: Diminished pedal pulses Skin: Skin color, texture, turgor normal. No rashes or lesions Neurologic: Grossly normal  EKG not performed today  ASSESSMENT AND PLAN:   Claudication in peripheral vascular disease ([name]) Ms. Chaudhuri returns today for follow-up of her recent peripheral vascular procedure which I performed on 12/29/2021.  She had a subtotally occluded ostial right common iliac artery which I stented.  She has residual total right SFA occlusion from the origin down to the adductor canal with three-vessel runoff.  She also had a 95% distal left common femoral artery stenosis with two-vessel runoff.  Her claudication has markedly improved.  She has no symptoms on the left side.  Her Dopplers show a patent right common iliac artery with improvement of her right ABI from 0.45-0.77.  She does continue to smoke unfortunately.  We will repeat lower extremity arterial Doppler studies in 6 months after which I will see her back in follow-up.  If she develops left lower extremity claudication she could have intervention on her left common femoral artery.     Donna Harp MD FACP,FACC,FAHA, [facility] 01/14/2022 1:58 PM

## 2022-01-16 ENCOUNTER — Ambulatory Visit (HOSPITAL_COMMUNITY)
Admission: RE | Admit: 2022-01-16 | Discharge: 2022-01-16 | Disposition: A | Discharge status: Home / Self Care | Payer: PRIVATE HEALTH INSURANCE | Source: Ambulatory Visit | Attending: Cardiology | Admitting: Cardiology

## 2022-01-16 ENCOUNTER — Other Ambulatory Visit: Payer: Self-pay

## 2022-01-16 DIAGNOSIS — R0989 Other specified symptoms and signs involving the circulatory and respiratory systems: Secondary | ICD-10-CM | POA: Diagnosis present

## 2022-01-27 ENCOUNTER — Ambulatory Visit: Payer: PRIVATE HEALTH INSURANCE | Admitting: Cardiovascular Disease

## 2022-01-27 ENCOUNTER — Other Ambulatory Visit (HOSPITAL_COMMUNITY): Payer: Self-pay

## 2022-02-25 ENCOUNTER — Ambulatory Visit: Payer: PRIVATE HEALTH INSURANCE | Admitting: Cardiovascular Disease

## 2022-03-04 ENCOUNTER — Encounter: Payer: Self-pay | Admitting: Cardiology

## 2022-03-04 ENCOUNTER — Ambulatory Visit (INDEPENDENT_AMBULATORY_CARE_PROVIDER_SITE_OTHER): Payer: PRIVATE HEALTH INSURANCE | Admitting: Cardiology

## 2022-03-04 ENCOUNTER — Other Ambulatory Visit: Payer: Self-pay

## 2022-03-04 VITALS — BP 142/76 | HR 86 | Ht 67.0 in | Wt 212.0 lb

## 2022-03-04 DIAGNOSIS — Z720 Tobacco use: Secondary | ICD-10-CM

## 2022-03-04 DIAGNOSIS — E1165 Type 2 diabetes mellitus with hyperglycemia: Secondary | ICD-10-CM

## 2022-03-04 DIAGNOSIS — Z794 Long term (current) use of insulin: Secondary | ICD-10-CM

## 2022-03-04 DIAGNOSIS — I739 Peripheral vascular disease, unspecified: Secondary | ICD-10-CM

## 2022-03-04 DIAGNOSIS — I255 Ischemic cardiomyopathy: Secondary | ICD-10-CM

## 2022-03-04 DIAGNOSIS — Z951 Presence of aortocoronary bypass graft: Secondary | ICD-10-CM

## 2022-03-04 DIAGNOSIS — I25708 Atherosclerosis of coronary artery bypass graft(s), unspecified, with other forms of angina pectoris: Secondary | ICD-10-CM | POA: Diagnosis not present

## 2022-03-04 NOTE — Addendum Note (Signed)
Addended by: Edwyna Shell I on: 03/04/2022 05:15 PM ? ? Modules accepted: Orders ? ?

## 2022-03-04 NOTE — Progress Notes (Signed)
?Cardiology Office Note:   ? ?Date:  03/04/2022  ? ?ID:  Edit Ricciardelli, DOB 06-Mar-1976, MRN [id number] ? ?PCP:  Barnetta Chapel, NP  ?Cardiologist:  Jenne Campus, MD   ? ?Referring MD: Barnetta Chapel, NP  ? ?Chief Complaint  ?Patient presents with  ? Follow-up  ?My legs are better but still having some symptoms ? ?History of Present Illness:   ? ?[name] is a 46 y.o. female   with past medical history significant for coronary artery disease.  In 2017 she did have coronary artery bypass graft with LIMA to LAD SVG to RCA.  Also history of essential hypertension, type 2 diabetes, smoking with sadly still ongoing.  Last evaluation for coronary artery was done by cardiac catheterization June 2021 which showed completely occluded LAD however LIMA to LAD was patent and functioning normally, there was complete occlusion of the proximal mid RCA, SVG to RCA was completely occluded, however RCA had pretty decent collateralization, there was also a 70% mid to distal obtuse marginal 2. ?At the beginning of January she had a aortogram and lower extremity angiogram performed.  He was find to have 99% stenosis of right common iliac artery and that being addressed with stenting, she also got completely occluded superficial femoral artery on the right side as well as significant distal stenosis of the left superficial femoral artery.  She was also put on Pletal overall she says she is feeling better she does not have any chest pain tightness squeezing pressure burning chest shortness of breath is still there as usual.  However she does have improvement in symptomatology in terms of claudications.  Sadly she still continues to smoke. ? ?Past Medical History:  ?Diagnosis Date  ? Abnormal stress test 08/20/2016  ? Formatting of this note might be different from the original. Added automatically from request for surgery [phone]  ? Acute appendicitis 01/28/2016  ? Acute appendicitis with localized peritonitis 01/28/2016  ? Acute  non-ST segment elevation myocardial infarction [facility]) 03/17/2021  ? Anemia   ? Angina pectoris ([place]) 06/27/2020  ? Anxiety disorder 03/17/2021  ? Calculus of gallbladder 06/05/2016  ? Cardiomyopathy ([place])   ? "postpartem cardiomyopathy"- 2012- has since been released by cardiology  ? Coronary artery disease 06/27/2020  ? COVID 10/14/2020  ? c/o cough and chest pressure   ? Depression   ? Dyslipidemia 06/27/2020  ? Dyspnea on exertion 06/27/2020  ? History of coronary artery bypass graft 09/22/2016  ? Formatting of this note might be different from the original. LIMA to LAD, SVG to PDA in September 2017 at [facility]  ? Hyperglycemia due to type 2 diabetes mellitus ([place]) 02/25/2016  ? Hypertension   ? Liver abscess   ? 2012Terrial Rhodes drained placed for awhile.  ? Long-term insulin use ([place]) 01/26/2017  ? Mixed hyperlipidemia 02/25/2016  ? NSTEMI (non-ST elevated myocardial infarction) ([place]) 06/07/2020  ? Obesity 09/18/2011  ? PCOS (polycystic ovarian syndrome) 02/25/2016  ? Persistent microalbuminuria associated with type 2 diabetes mellitus ([place]) 02/25/2016  ? Renal failure 09/25/2011  ? Due to vancomycin, IV contrast, sepsis.    ? 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 7 cm right ovarian dermoid cyst - will schedule surgery after pregnancy   ? SIRS due to infectious process with acute organ dysfunction ([place]) 09/18/2011  ? Multiorgan failure secondary to sepsis after IR drainage of liver abscess.  Was hospitalized, intubated, had cardiopulmonary failure.   ? Tobacco abuse   ? Type  2 diabetes mellitus with hyperglycemia ([place]) 02/25/2016  ? ? ?Past Surgical History:  ?Procedure Laterality Date  ? ABDOMINAL AORTOGRAM W/LOWER EXTREMITY N/A 12/29/2021  ? Procedure: ABDOMINAL AORTOGRAM W/LOWER EXTREMITY;  Surgeon: Lorretta Harp, MD;  Location: [facility];  Service: Cardiovascular;  Laterality: N/A;  ? APPENDECTOMY    ? CORONARY ARTERY BYPASS GRAFT  2017  ? [place]    ? 90's- cervix  ? DILATION AND CURETTAGE OF UTERUS  1994  ? LAPAROSCOPIC APPENDECTOMY N/A 01/28/2016  ? Procedure: APPENDECTOMY LAPAROSCOPIC;  Surgeon: Stark Klein, MD;  Location: [facility];  Service: General;  Laterality: N/A;  ? LEFT HEART CATH AND CORS/GRAFTS ANGIOGRAPHY N/A 06/07/2020  ? Procedure: LEFT HEART CATH AND CORS/GRAFTS ANGIOGRAPHY;  Surgeon: Martinique, Peter M, MD;  Location: [facility];  Service: Cardiovascular;  Laterality: N/A;  ? PERIPHERAL VASCULAR INTERVENTION  12/29/2021  ? Procedure: PERIPHERAL VASCULAR INTERVENTION;  Surgeon: Lorretta Harp, MD;  Location: [facility];  Service: Cardiovascular;;  Rt. Iliac  ? ROBOTIC ASSISTED SALPINGO OOPHERECTOMY Right 03/24/2016  ? Procedure: XI ROBOTIC ASSISTED LAPAROSCOPIC RIGHT SALPINGO OOPHORECTOMY;  Surgeon: Everitt Amber, MD;  Location: [facility];  Service: Gynecology;  Laterality: Right;  ? TONSILLECTOMY    ? ? ?Current Medications: ?Current Meds  ?Medication Sig  ? aspirin EC 81 MG tablet Take 1 tablet (81 mg total) by mouth daily. Swallow whole.  ? atorvastatin (LIPITOR) 80 MG tablet Take 1 tablet (80 mg total) by mouth daily. (Patient taking differently: Take 80 mg by mouth at bedtime.)  ? busPIRone (BUSPAR) 15 MG tablet Take 15 mg by mouth 2 (two) times daily.  ? carvedilol (COREG) 12.5 MG tablet Take 1 tablet (12.5 mg total) by mouth 2 (two) times daily with a meal.  ? Cholecalciferol (D3 5000) 125 MCG (5000 UT) capsule Take 5,000 Units by mouth every morning.  ? cilostazol (PLETAL) 50 MG tablet Take 1 tablet (50 mg total) by mouth 2 (two) times daily.  ? clopidogrel (PLAVIX) 75 MG tablet Take 1 tablet (75 mg total) by mouth daily with breakfast.  ? cyanocobalamin (,VITAMIN B-12,) 1000 MCG/ML injection Inject 1,000 mcg into the muscle every 30 (thirty) days.  ? gabapentin (NEURONTIN) 300 MG capsule Take 600 mg by mouth 2 (two) times daily.  ? glimepiride (AMARYL) 1 MG tablet Take 1 mg by mouth every morning.  ? isosorbide  mononitrate (IMDUR) 60 MG 24 hr tablet TAKE 1 TABLET BY MOUTH EVERY DAY (Patient taking differently: Take 60 mg by mouth in the morning.)  ? losartan (COZAAR) 25 MG tablet Take 1 tablet by mouth once daily (Patient taking differently: Take 25 mg by mouth daily.)  ? metFORMIN (GLUCOPHAGE-XR) 500 MG 24 hr tablet Take 2 tablets (1,000 mg total) by mouth 2 (two) times daily.  ? nitroGLYCERIN (NITROSTAT) 0.4 MG SL tablet Place 1 tablet (0.4 mg total) under the tongue every 5 (five) minutes as needed. (Patient taking differently: Place 0.4 mg under the tongue every 5 (five) minutes as needed for chest pain.)  ? ranolazine (RANEXA) 1000 MG SR tablet Take 1 tablet (1,000 mg total) by mouth 2 (two) times daily.  ?  ? ?Allergies:   Patient has no known allergies.  ? ?Social History  ? ?Socioeconomic History  ? Marital status: Single  ?  Spouse name: Not on file  ? Number of children: Not on file  ? Years of education: Not on file  ? Highest  education level: Not on file  ?Occupational History  ? Not on file  ?Tobacco Use  ? Smoking status: Every Day  ?  Packs/day: 0.50  ?  Years: 21.00  ?  Pack years: 10.50  ?  Types: Cigarettes  ? Smokeless tobacco: Never  ?Substance and Sexual Activity  ? Alcohol use: Yes  ?  Comment: rare social  ? Drug use: No  ?  Comment: x1 episode "Meth" use.  ? Sexual activity: Not Currently  ?  Birth control/protection: None  ?Other Topics Concern  ? Not on file  ?Social History Narrative  ? Not on file  ? ?Social Determinants of Health  ? ?Financial Resource Strain: Not on file  ?Food Insecurity: Not on file  ?Transportation Needs: Not on file  ?Physical Activity: Not on file  ?Stress: Not on file  ?Social Connections: Not on file  ?  ? ?Family History: ?The patient's family history includes CAD in her father and mother; Diabetes in her maternal grandmother and paternal grandfather. ?ROS:   ?Please see the history of present illness.    ?All 14 point review of systems negative except as described per  history of present illness ? ?EKGs/Labs/Other Studies Reviewed:   ? ? ? ?Recent Labs: ?03/15/2021: B Natriuretic Peptide 113.5 ?12/02/2021: ALT 16 ?12/30/2021: BUN 11; Creatinine, Ser 0.62; Hemoglobin 12.1; Plat

## 2022-03-04 NOTE — Patient Instructions (Signed)
Medication Instructions:  ?Your physician recommends that you continue on your current medications as directed. Please refer to the Current Medication list given to you today. ? ?*If you need a refill on your cardiac medications before your next appointment, please call your pharmacy* ? ? ?Lab Work: ?None ?If you have labs (blood work) drawn today and your tests are completely normal, you will receive your results only by: ?MyChart Message (if you have MyChart) OR ?A paper copy in the mail ?If you have any lab test that is abnormal or we need to change your treatment, we will call you to review the results. ? ? ?Testing/Procedures: ?Your physician has requested that you have an echocardiogram. Echocardiography is a painless test that uses sound waves to create images of your heart. It provides your doctor with information about the size and shape of your heart and how well your heart?s chambers and valves are working. This procedure takes approximately one hour. There are no restrictions for this procedure.  ? ? ?Follow-Up: ?At [facility], you and your health needs are our priority.  As part of our continuing mission to provide you with exceptional heart care, we have created designated Provider Care Teams.  These Care Teams include your primary Cardiologist (physician) and Advanced Practice Providers (APPs -  Physician Assistants and Nurse Practitioners) who all work together to provide you with the care you need, when you need it. ? ?We recommend signing up for the patient portal called "MyChart".  Sign up information is provided on this After Visit Summary.  MyChart is used to connect with patients for Virtual Visits (Telemedicine).  Patients are able to view lab/test results, encounter notes, upcoming appointments, etc.  Non-urgent messages can be sent to your provider as well.   ?To learn more about what you can do with MyChart, go to [email or web address].   ? ?Your next appointment:   ?3 month(s) ? ?The  format for your next appointment:   ?In Person ? ?Provider:   ?Jenne Campus, MD  ? ? ?Other Instructions ?None ? ?

## 2022-03-10 ENCOUNTER — Ambulatory Visit (INDEPENDENT_AMBULATORY_CARE_PROVIDER_SITE_OTHER): Payer: PRIVATE HEALTH INSURANCE

## 2022-03-10 DIAGNOSIS — E1165 Type 2 diabetes mellitus with hyperglycemia: Secondary | ICD-10-CM | POA: Diagnosis not present

## 2022-03-10 DIAGNOSIS — Z720 Tobacco use: Secondary | ICD-10-CM | POA: Diagnosis not present

## 2022-03-10 DIAGNOSIS — Z951 Presence of aortocoronary bypass graft: Secondary | ICD-10-CM

## 2022-03-10 DIAGNOSIS — I739 Peripheral vascular disease, unspecified: Secondary | ICD-10-CM

## 2022-03-10 DIAGNOSIS — I255 Ischemic cardiomyopathy: Secondary | ICD-10-CM

## 2022-03-10 DIAGNOSIS — I25708 Atherosclerosis of coronary artery bypass graft(s), unspecified, with other forms of angina pectoris: Secondary | ICD-10-CM | POA: Diagnosis not present

## 2022-03-10 DIAGNOSIS — Z794 Long term (current) use of insulin: Secondary | ICD-10-CM

## 2022-03-10 LAB — ECHOCARDIOGRAM COMPLETE
Area-P 1/2: 3.48 cm2
Calc EF: 46.4 %
MV M vel: 4.98 m/s
MV Peak grad: 99 mmHg
S' Lateral: 4.3 cm
Single Plane A2C EF: 48.9 %
Single Plane A4C EF: 47.3 %

## 2022-03-26 ENCOUNTER — Other Ambulatory Visit: Payer: Self-pay

## 2022-03-26 MED ORDER — ISOSORBIDE MONONITRATE ER 60 MG PO TB24: 60.0000 mg | ORAL_TABLET | Freq: Every day | ORAL | 3 refills | Status: DC

## 2022-04-16 ENCOUNTER — Other Ambulatory Visit: Payer: Self-pay | Admitting: Cardiology

## 2022-05-20 ENCOUNTER — Other Ambulatory Visit: Payer: Self-pay

## 2022-05-20 ENCOUNTER — Encounter (HOSPITAL_COMMUNITY): Payer: Self-pay | Admitting: Emergency Medicine

## 2022-05-20 ENCOUNTER — Emergency Department (HOSPITAL_COMMUNITY)
Admission: EM | Admit: 2022-05-20 | Discharge: 2022-05-20 | Discharge status: Against Medical Advice | Payer: PRIVATE HEALTH INSURANCE | Attending: Student | Admitting: Student

## 2022-05-20 ENCOUNTER — Emergency Department (HOSPITAL_COMMUNITY): Payer: PRIVATE HEALTH INSURANCE

## 2022-05-20 DIAGNOSIS — R0600 Dyspnea, unspecified: Secondary | ICD-10-CM | POA: Diagnosis not present

## 2022-05-20 DIAGNOSIS — Z5321 Procedure and treatment not carried out due to patient leaving prior to being seen by health care provider: Secondary | ICD-10-CM | POA: Insufficient documentation

## 2022-05-20 DIAGNOSIS — I2510 Atherosclerotic heart disease of native coronary artery without angina pectoris: Secondary | ICD-10-CM | POA: Insufficient documentation

## 2022-05-20 DIAGNOSIS — R0789 Other chest pain: Secondary | ICD-10-CM | POA: Diagnosis present

## 2022-05-20 DIAGNOSIS — R110 Nausea: Secondary | ICD-10-CM | POA: Diagnosis not present

## 2022-05-20 LAB — CBC WITH DIFFERENTIAL/PLATELET
Abs Immature Granulocytes: 0.02 10*3/uL (ref 0.00–0.07)
Basophils Absolute: 0.1 10*3/uL (ref 0.0–0.1)
Basophils Relative: 1 %
Eosinophils Absolute: 0 10*3/uL (ref 0.0–0.5)
Eosinophils Relative: 0 %
HCT: 35.9 % — ABNORMAL LOW (ref 36.0–46.0)
Hemoglobin: 10.9 g/dL — ABNORMAL LOW (ref 12.0–15.0)
Immature Granulocytes: 0 %
Lymphocytes Relative: 22 %
Lymphs Abs: 1.8 10*3/uL (ref 0.7–4.0)
MCH: 23.1 pg — ABNORMAL LOW (ref 26.0–34.0)
MCHC: 30.4 g/dL (ref 30.0–36.0)
MCV: 76.2 fL — ABNORMAL LOW (ref 80.0–100.0)
Monocytes Absolute: 0.8 10*3/uL (ref 0.1–1.0)
Monocytes Relative: 10 %
Neutro Abs: 5.3 10*3/uL (ref 1.7–7.7)
Neutrophils Relative %: 67 %
Platelets: 194 10*3/uL (ref 150–400)
RBC: 4.71 MIL/uL (ref 3.87–5.11)
RDW: 15.5 % (ref 11.5–15.5)
WBC: 8 10*3/uL (ref 4.0–10.5)
nRBC: 0 % (ref 0.0–0.2)

## 2022-05-20 LAB — COMPREHENSIVE METABOLIC PANEL
ALT: 11 U/L (ref 0–44)
AST: 11 U/L — ABNORMAL LOW (ref 15–41)
Albumin: 3.7 g/dL (ref 3.5–5.0)
Alkaline Phosphatase: 54 U/L (ref 38–126)
Anion gap: 6 (ref 5–15)
BUN: 13 mg/dL (ref 6–20)
CO2: 24 mmol/L (ref 22–32)
Calcium: 9.3 mg/dL (ref 8.9–10.3)
Chloride: 104 mmol/L (ref 98–111)
Creatinine, Ser: 0.63 mg/dL (ref 0.44–1.00)
GFR, Estimated: >60 mL/min (ref >60)
Glucose, Bld: 379 mg/dL — ABNORMAL HIGH (ref 70–99)
Potassium: 4.3 mmol/L (ref 3.5–5.1)
Sodium: 134 mmol/L — ABNORMAL LOW (ref 135–145)
Total Bilirubin: 0.3 mg/dL (ref 0.3–1.2)
Total Protein: 6.7 g/dL (ref 6.5–8.1)

## 2022-05-20 LAB — I-STAT BETA HCG BLOOD, ED (MC, WL, AP ONLY): I-stat hCG, quantitative: <5 m[IU]/mL (ref <5)

## 2022-05-20 LAB — LIPASE, BLOOD: Lipase: 62 U/L — ABNORMAL HIGH (ref 11–51)

## 2022-05-20 LAB — TROPONIN I (HIGH SENSITIVITY): Troponin I (High Sensitivity): 9 ng/L (ref <18)

## 2022-05-20 NOTE — ED Provider Triage Note (Signed)
Emergency Medicine Provider Triage Evaluation Note  [name] , a 46 y.o. female  was evaluated in triage.  Pt complains of chest pain since 22:30. Laying supine with onset. Central, burning/tight in nature, feels it in her back as well with associated nausea & dyspnea. Pain worse when laying down, better with standing up. Hx of CAD.   Review of Systems  Positive: Chest pain, nausea, dyspnea Negative: Abdominal pain, vomiting, diaphoresis  Physical Exam  BP (!) 173/78 (BP Location: Left Arm)   Pulse 86   Temp 97.9 F (36.6 C)   Resp 17   SpO2 96%  Gen:   Awake, no distress   Resp:  Normal effort  MSK:   Moves extremities without difficulty  Other:  No peritoneal signs on abdominal exam.   Medical Decision Making  Medically screening exam initiated at 12:16 AM.  Appropriate orders placed.  [name] was informed that the remainder of the evaluation will be completed by another provider, this initial triage assessment does not replace that evaluation, and the importance of remaining in the ED until their evaluation is complete.  Chest pain   Amaryllis Dyke, PA-C 05/20/22 0021

## 2022-05-20 NOTE — ED Notes (Addendum)
Pt called multiple times and pt is not in bathroom or triage  I called X-ray pt is not in X-ray

## 2022-05-20 NOTE — ED Triage Notes (Signed)
Pt c/o left sided chest pain that radiates to her back and shortness of breath that started at 10pm tonight. States she has taken 3 NTG with no relief.

## 2022-06-11 ENCOUNTER — Ambulatory Visit: Payer: PRIVATE HEALTH INSURANCE | Admitting: Cardiology

## 2022-06-29 ENCOUNTER — Ambulatory Visit (HOSPITAL_COMMUNITY)
Admission: RE | Admit: 2022-06-29 | Discharge: 2022-06-29 | Disposition: A | Discharge status: Home / Self Care | Payer: PRIVATE HEALTH INSURANCE | Source: Ambulatory Visit | Attending: Cardiology | Admitting: Cardiology

## 2022-06-29 DIAGNOSIS — I739 Peripheral vascular disease, unspecified: Secondary | ICD-10-CM | POA: Diagnosis not present

## 2022-06-29 DIAGNOSIS — Z95828 Presence of other vascular implants and grafts: Secondary | ICD-10-CM | POA: Diagnosis not present

## 2022-07-04 ENCOUNTER — Other Ambulatory Visit: Payer: Self-pay | Admitting: Cardiology

## 2022-07-06 NOTE — Telephone Encounter (Signed)
Rx refill sent to pharmacy. 

## 2022-08-25 ENCOUNTER — Emergency Department (HOSPITAL_COMMUNITY)
Admission: EM | Admit: 2022-08-25 | Discharge: 2022-08-25 | Disposition: A | Discharge status: Home / Self Care | Payer: PRIVATE HEALTH INSURANCE | Attending: Emergency Medicine | Admitting: Emergency Medicine

## 2022-08-25 ENCOUNTER — Encounter (HOSPITAL_COMMUNITY): Payer: Self-pay

## 2022-08-25 ENCOUNTER — Emergency Department (HOSPITAL_COMMUNITY): Payer: PRIVATE HEALTH INSURANCE

## 2022-08-25 ENCOUNTER — Other Ambulatory Visit: Payer: Self-pay

## 2022-08-25 DIAGNOSIS — X58XXXA Exposure to other specified factors, initial encounter: Secondary | ICD-10-CM | POA: Insufficient documentation

## 2022-08-25 DIAGNOSIS — S0093XA Contusion of unspecified part of head, initial encounter: Secondary | ICD-10-CM | POA: Diagnosis not present

## 2022-08-25 DIAGNOSIS — Z79899 Other long term (current) drug therapy: Secondary | ICD-10-CM | POA: Diagnosis not present

## 2022-08-25 DIAGNOSIS — R42 Dizziness and giddiness: Secondary | ICD-10-CM

## 2022-08-25 DIAGNOSIS — R072 Precordial pain: Secondary | ICD-10-CM

## 2022-08-25 DIAGNOSIS — S0990XA Unspecified injury of head, initial encounter: Secondary | ICD-10-CM | POA: Diagnosis present

## 2022-08-25 DIAGNOSIS — R739 Hyperglycemia, unspecified: Secondary | ICD-10-CM | POA: Insufficient documentation

## 2022-08-25 DIAGNOSIS — I10 Essential (primary) hypertension: Secondary | ICD-10-CM | POA: Insufficient documentation

## 2022-08-25 DIAGNOSIS — Z7984 Long term (current) use of oral hypoglycemic drugs: Secondary | ICD-10-CM | POA: Insufficient documentation

## 2022-08-25 DIAGNOSIS — Z7902 Long term (current) use of antithrombotics/antiplatelets: Secondary | ICD-10-CM | POA: Diagnosis not present

## 2022-08-25 DIAGNOSIS — Z7982 Long term (current) use of aspirin: Secondary | ICD-10-CM | POA: Diagnosis not present

## 2022-08-25 LAB — CBC WITH DIFFERENTIAL/PLATELET
Abs Immature Granulocytes: 0.03 10*3/uL (ref 0.00–0.07)
Basophils Absolute: 0.1 10*3/uL (ref 0.0–0.1)
Basophils Relative: 1 %
Eosinophils Absolute: 0 10*3/uL (ref 0.0–0.5)
Eosinophils Relative: 0 %
HCT: 38 % (ref 36.0–46.0)
Hemoglobin: 11.3 g/dL — ABNORMAL LOW (ref 12.0–15.0)
Immature Granulocytes: 0 %
Lymphocytes Relative: 25 %
Lymphs Abs: 2 10*3/uL (ref 0.7–4.0)
MCH: 21.4 pg — ABNORMAL LOW (ref 26.0–34.0)
MCHC: 29.7 g/dL — ABNORMAL LOW (ref 30.0–36.0)
MCV: 72 fL — ABNORMAL LOW (ref 80.0–100.0)
Monocytes Absolute: 0.7 10*3/uL (ref 0.1–1.0)
Monocytes Relative: 9 %
Neutro Abs: 5.2 10*3/uL (ref 1.7–7.7)
Neutrophils Relative %: 65 %
Platelets: 209 10*3/uL (ref 150–400)
RBC: 5.28 MIL/uL — ABNORMAL HIGH (ref 3.87–5.11)
RDW: 20.9 % — ABNORMAL HIGH (ref 11.5–15.5)
WBC: 8 10*3/uL (ref 4.0–10.5)
nRBC: 0 % (ref 0.0–0.2)

## 2022-08-25 LAB — BASIC METABOLIC PANEL
Anion gap: 9 (ref 5–15)
BUN: 14 mg/dL (ref 6–20)
CO2: 24 mmol/L (ref 22–32)
Calcium: 9.5 mg/dL (ref 8.9–10.3)
Chloride: 102 mmol/L (ref 98–111)
Creatinine, Ser: 0.75 mg/dL (ref 0.44–1.00)
GFR, Estimated: >60 mL/min (ref >60)
Glucose, Bld: 347 mg/dL — ABNORMAL HIGH (ref 70–99)
Potassium: 4.5 mmol/L (ref 3.5–5.1)
Sodium: 135 mmol/L (ref 135–145)

## 2022-08-25 LAB — TROPONIN I (HIGH SENSITIVITY)
Troponin I (High Sensitivity): 8 ng/L (ref <18)
Troponin I (High Sensitivity): 8 ng/L (ref <18)

## 2022-08-25 LAB — MAGNESIUM: Magnesium: 1.6 mg/dL — ABNORMAL LOW (ref 1.7–2.4)

## 2022-08-25 MED ORDER — SODIUM CHLORIDE 0.9 % IV BOLUS
1000.0000 mL | Freq: Once | INTRAVENOUS | Status: AC
  Administered 2022-08-25: 1000 mL via INTRAVENOUS

## 2022-08-25 MED ORDER — ACETAMINOPHEN 500 MG PO TABS
1000.0000 mg | ORAL_TABLET | Freq: Once | ORAL | Status: AC
  Administered 2022-08-25: 1000 mg via ORAL
  Filled 2022-08-25: qty 2

## 2022-08-25 NOTE — ED Notes (Signed)
Patient verbalizes understanding of discharge instructions. Opportunity for questioning and answers were provided. Armband removed by staff, pt discharged from ED. Pt ambulatory to ED waiting room with steady gait.  
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
## 2022-08-25 NOTE — ED Notes (Signed)
Pt ambulated in hall with steady gait, c/o "tiny bit" of dizziness upon standing and during ambulation.

## 2022-08-25 NOTE — Discharge Instructions (Addendum)
It was our pleasure to provide your ER care today - we hope that you feel better.  Your blood sugar is high - Drink plenty of water/stay well hydrated, follow diabetes meal plan. Continue meds. Monitor glucose 4x/day and record values.  Follow up closely with your doctor this coming week.   Follow up closely with your cardiologist in the coming week.  Return to ER if worse, new symptoms, fainting, recurrent/persistent chest pain, trouble breathing, or other emergency concern.

## 2022-08-28 ENCOUNTER — Encounter: Payer: Self-pay | Admitting: Cardiology

## 2022-08-28 ENCOUNTER — Ambulatory Visit: Payer: PRIVATE HEALTH INSURANCE | Attending: Cardiology

## 2022-08-28 ENCOUNTER — Ambulatory Visit: Payer: PRIVATE HEALTH INSURANCE | Attending: Cardiology | Admitting: Cardiology

## 2022-08-28 VITALS — BP 152/72 | HR 100 | Ht 67.0 in | Wt 218.0 lb

## 2022-08-28 DIAGNOSIS — I255 Ischemic cardiomyopathy: Secondary | ICD-10-CM

## 2022-08-28 DIAGNOSIS — I25708 Atherosclerosis of coronary artery bypass graft(s), unspecified, with other forms of angina pectoris: Secondary | ICD-10-CM | POA: Diagnosis not present

## 2022-08-28 DIAGNOSIS — E785 Hyperlipidemia, unspecified: Secondary | ICD-10-CM | POA: Diagnosis not present

## 2022-08-28 DIAGNOSIS — R42 Dizziness and giddiness: Secondary | ICD-10-CM

## 2022-08-28 DIAGNOSIS — Z720 Tobacco use: Secondary | ICD-10-CM

## 2022-08-28 DIAGNOSIS — I739 Peripheral vascular disease, unspecified: Secondary | ICD-10-CM

## 2022-08-28 DIAGNOSIS — I10 Essential (primary) hypertension: Secondary | ICD-10-CM | POA: Diagnosis not present

## 2022-08-28 DIAGNOSIS — S0990XA Unspecified injury of head, initial encounter: Secondary | ICD-10-CM

## 2022-08-28 HISTORY — DX: Dizziness and giddiness: R42

## 2022-08-28 MED ORDER — ISOSORBIDE MONONITRATE ER 120 MG PO TB24: 120.0000 mg | ORAL_TABLET | Freq: Every day | ORAL | 3 refills | Status: DC

## 2022-08-28 NOTE — Progress Notes (Unsigned)
Cardiology Office Note:    Date:  08/28/2022   ID:  [name], DOB Jun 19, 1976, MRN [id number]  PCP:  Barnetta Chapel, NP  Cardiologist:  Jenne Campus, MD    Referring MD: Lajean Saver, MD   Chief Complaint  Patient presents with   Dizziness   Blurred Vision   Chest Pain        Shortness of Breath    Ongoing for a month     History of Present Illness:    [name] is a 46 y.o. female   with past medical history significant for coronary artery disease.  In 2017 she did have coronary artery bypass graft with LIMA to LAD SVG to RCA.  Also history of essential hypertension, type 2 diabetes, smoking with sadly still ongoing.  Last evaluation for coronary artery was done by cardiac catheterization June 2021 which showed completely occluded LAD however LIMA to LAD was patent and functioning normally, there was complete occlusion of the proximal mid RCA, SVG to RCA was completely occluded, however RCA had pretty decent collateralization, there was also a 70% mid to distal obtuse marginal 2. At the beginning of January she had a aortogram and lower extremity angiogram performed.  He was find to have 99% stenosis of right common iliac artery and that being addressed with stenting, she also got completely occluded superficial femoral artery on the right side as well as significant distal stenosis of the left superficial femoral artery.  She was also put on Pletal overall she says she is feeling better  Today she requested to be seen because of episode of dizziness.  Story is quite interesting few weeks ago she was helping her husband she was trying to give him some circular saw does start coming off that she dropped a saw and hit herself in the head it was very bad she did not passed out however the next day she had some periods of confusion she did not know how to get back home while driving.  She did not look for any medical attention.  Since that time she has been having episode of  dizziness does happen when she is standing out of blue she may start feeling some tunnel vision kind of sensation sometimes she gets chest pain with this sensation.  It last few minutes sitting down or laying down helps with that.  It never happened when she is it never happens when she lays down.  She ended coming to the emergency room for that complaint.  CT of her head was done which was negative.  All test were negative including troponin I.  She was referred to [place].   Past Medical History:  Diagnosis Date   Abnormal stress test 08/20/2016   Formatting of this note might be different from the original. Added automatically from request for surgery [id number]   Acute appendicitis 01/28/2016   Acute appendicitis with localized peritonitis 01/28/2016   Acute non-ST segment elevation myocardial infarction ([place]) 03/17/2021   Anemia    Angina pectoris ([place]) 06/27/2020   Anxiety disorder 03/17/2021   Calculus of gallbladder 06/05/2016   Cardiomyopathy ([place])    "postpartem cardiomyopathy"- 2012- has since been released by cardiology   Coronary artery disease 06/27/2020   COVID 10/14/2020   c/o cough and chest pressure    Depression    Dyslipidemia 06/27/2020   Dyspnea on exertion 06/27/2020   History of coronary artery bypass graft 09/22/2016   Formatting of this note might be different from the  original. LIMA to LAD, SVG to PDA in September 2017 at [facility]   Hyperglycemia due to type 2 diabetes mellitus ([place]) 02/25/2016   Hypertension    Liver abscess    2012- Terrial Rhodes drained placed for awhile.   Long-term insulin use ([place]) 01/26/2017   Mixed hyperlipidemia 02/25/2016   NSTEMI (non-ST elevated myocardial infarction) ([place]) 06/07/2020   Obesity 09/18/2011   PCOS (polycystic ovarian syndrome) 02/25/2016   Persistent microalbuminuria associated with type 2 diabetes mellitus ([place]) 02/25/2016   Renal failure 09/25/2011   Due to vancomycin, IV contrast, sepsis.      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) 09/18/2011   Multiorgan failure secondary to sepsis after IR drainage of liver abscess.  Was hospitalized, intubated, had cardiopulmonary failure.    Tobacco abuse    Type 2 diabetes mellitus with hyperglycemia ([place]) 02/25/2016    Past Surgical History:  Procedure Laterality Date   ABDOMINAL AORTOGRAM W/LOWER EXTREMITY N/A 12/29/2021   Procedure: ABDOMINAL AORTOGRAM W/LOWER EXTREMITY;  Surgeon: Lorretta Harp, MD;  Location: [place] [facility];  Service: Cardiovascular;  Laterality: N/A;   APPENDECTOMY     CORONARY ARTERY BYPASS GRAFT  2017   [facility]   CRYOTHERAPY     54's- cervix   DILATION AND CURETTAGE OF UTERUS  1994   LAPAROSCOPIC APPENDECTOMY N/A 01/28/2016   Procedure: APPENDECTOMY LAPAROSCOPIC;  Surgeon: Stark Klein, MD;  Location: [facility];  Service: General;  Laterality: N/A;   LEFT HEART CATH AND CORS/GRAFTS ANGIOGRAPHY N/A 06/07/2020   Procedure: LEFT HEART CATH AND CORS/GRAFTS ANGIOGRAPHY;  Surgeon: Martinique, Peter M, MD;  Location: [facility];  Service: Cardiovascular;  Laterality: N/A;   PERIPHERAL VASCULAR INTERVENTION  12/29/2021   Procedure: PERIPHERAL VASCULAR INTERVENTION;  Surgeon: Lorretta Harp, MD;  Location: [facility];  Service: Cardiovascular;;  Rt. Iliac   ROBOTIC ASSISTED SALPINGO OOPHERECTOMY Right 03/24/2016   Procedure: XI ROBOTIC ASSISTED LAPAROSCOPIC RIGHT SALPINGO OOPHORECTOMY;  Surgeon: Everitt Amber, MD;  Location: [facility];  Service: Gynecology;  Laterality: Right;   TONSILLECTOMY      Current Medications: Current Meds  Medication Sig   aspirin EC 81 MG tablet Take 1 tablet (81 mg total) by mouth daily. Swallow whole.   atorvastatin (LIPITOR) 80 MG tablet Take 1 tablet (80 mg total) by mouth daily. (Patient taking differently: Take 80 mg by  mouth at bedtime.)   busPIRone (BUSPAR) 15 MG tablet Take 15 mg by mouth 2 (two) times daily.   carvedilol (COREG) 12.5 MG tablet TAKE 1 TABLET BY MOUTH TWICE DAILY WITH A MEAL   CHLOROPHYLL PO Take 1,200 mg by mouth daily.   Cholecalciferol (D3 5000) 125 MCG (5000 UT) capsule Take 5,000 Units by mouth every morning.   cilostazol (PLETAL) 50 MG tablet Take 1 tablet (50 mg total) by mouth 2 (two) times daily.   clopidogrel (PLAVIX) 75 MG tablet Take 1 tablet (75 mg total) by mouth daily with breakfast.   cyanocobalamin (,VITAMIN B-12,) 1000 MCG/ML injection Inject 1,000 mcg into the muscle every 30 (thirty) days.   escitalopram (LEXAPRO) 10 MG tablet Take 10 mg by mouth daily.   gabapentin (NEURONTIN) 300 MG capsule Take 600 mg by mouth 2 (two) times daily.   isosorbide mononitrate (IMDUR) 60 MG 24 hr tablet Take 1 tablet (60  mg total) by mouth daily.   losartan (COZAAR) 25 MG tablet Take 1 tablet (25 mg total) by mouth daily.   Magnesium 400 MG TABS Take 1 tablet by mouth daily.   metFORMIN (GLUCOPHAGE-XR) 500 MG 24 hr tablet Take 2 tablets (1,000 mg total) by mouth 2 (two) times daily.   nitroGLYCERIN (NITROSTAT) 0.4 MG SL tablet Place 1 tablet (0.4 mg total) under the tongue every 5 (five) minutes as needed. (Patient taking differently: Place 0.4 mg under the tongue every 5 (five) minutes as needed for chest pain.)   ranolazine (RANEXA) 1000 MG SR tablet Take 1 tablet by mouth twice daily (Patient taking differently: Take 500 mg by mouth 2 (two) times daily.)   [DISCONTINUED] glimepiride (AMARYL) 1 MG tablet Take 1 mg by mouth every morning.     Allergies:   Patient has no known allergies.   Social History   Socioeconomic History   Marital status: Single    Spouse name: Not on file   Number of children: Not on file   Years of education: Not on file   Highest education level: Not on file  Occupational History   Not on file  Tobacco Use   Smoking status: Every Day    Packs/day: 0.50     Years: 21.00    Total pack years: 10.50    Types: Cigarettes   Smokeless tobacco: Never  Substance and Sexual Activity   Alcohol use: Yes    Comment: rare social   Drug use: No    Comment: x1 episode "Meth" use.   Sexual activity: Not Currently    Birth control/protection: None  Other Topics Concern   Not on file  Social History Narrative   Not on file   Social Determinants of Health   Financial Resource Strain: Not on file  Food Insecurity: Not on file  Transportation Needs: Not on file  Physical Activity: Not on file  Stress: Not on file  Social Connections: Not on file     Family History: The patient's family history includes CAD in her father and mother; Diabetes in her maternal grandmother and paternal grandfather. ROS:   Please see the history of present illness.    All 14 point review of systems negative except as described per history of present illness  EKGs/Labs/Other Studies Reviewed:      Recent Labs: 05/20/2022: ALT 11 08/25/2022: BUN 14; Creatinine, Ser 0.75; Hemoglobin 11.3; Magnesium 1.6; Platelets 209; Potassium 4.5; Sodium 135  Recent Lipid Panel    Component Value Date/Time   CHOL 99 (L) 12/02/2021 0926   TRIG 132 12/02/2021 0926   HDL 30 (L) 12/02/2021 0926   CHOLHDL 3.3 12/02/2021 0926   CHOLHDL 4.9 06/07/2020 1422   VLDL 23 06/07/2020 1422   LDLCALC 45 12/02/2021 0926    Physical Exam:    VS:  BP (!) 152/72 (BP Location: Left Arm, Patient Position: Sitting)   Pulse 100   Ht '5\' 7"'$  (1.702 m)   Wt 218 lb (98.9 kg)   SpO2 96%   BMI 34.14 kg/m     Wt Readings from Last 3 Encounters:  08/28/22 218 lb (98.9 kg)  08/25/22 212 lb (96.2 kg)  03/04/22 212 lb (96.2 kg)     GEN:  Well nourished, well developed in no acute distress HEENT: Normal NECK: No JVD; No carotid bruits LYMPHATICS: No lymphadenopathy CARDIAC: RRR, no murmurs, no rubs, no gallops RESPIRATORY:  Clear to auscultation without rales, wheezing or rhonchi  ABDOMEN:  Soft,  non-tender, non-distended MUSCULOSKELETAL:  No edema; No deformity  SKIN: Warm and dry LOWER EXTREMITIES: no swelling NEUROLOGIC:  Alert and oriented x 3 PSYCHIATRIC:  Normal affect   ASSESSMENT:    1. Coronary artery disease of bypass graft of native heart with stable angina pectoris ([place])   2. Primary hypertension   3. Ischemic cardiomyopathy   4. Dyslipidemia   5. Tobacco abuse   6. Claudication in peripheral vascular disease ([place])   7. Dizziness    PLAN:    In order of problems listed above:  Episodes of dizziness.  Happening only when she is standing up after trauma to the head.  Look like she suffered from concussion.  She did have some period of confusion following day.  Also have some issue with memory which is gradually getting better.  I think she deserves to be evaluated by neurologist.  Likely CT of her head did not show any acute pathology, there is no subdural hematoma there is no subarachnoid bleed.  However I think evaluation by neurology should be warranted.  In terms of cardiac work-up for this sensation.  I will ask her to wear Zio patch for 2 weeks to see if there is any arrhythmia relation to her symptomatology which I doubt, recent echocardiogram reviewed.  Looks good.  She described to have some chest tightness when it happens but for is what is happening is dizziness and then chest tightness therefore I think this is a secondary.  In the spite of that I will increase dose of her Imdur to 120 mg daily Essential hypertension uncontrolled.  I will increase dose of Imdur which should help with that History of ischemic cardiomyopathy, last Echocardiogram done in March showed ejection fraction 45 to 50%, she is on carvedilol as well as Cozaar.  I will not alter any medication right now when we have this includes situational dizziness. Claudication, peripheral vascular disease: That being followed by my vascular colleagues. Tobacco abuse: Again she was asked to quit  smoking I did review record from emergency room from this visit   Medication Adjustments/Labs and Tests Ordered: Current medicines are reviewed at length with the patient today.  Concerns regarding medicines are outlined above.  No orders of the defined types were placed in this encounter.  Medication changes: No orders of the defined types were placed in this encounter.   Signed, Park Liter, MD, [facility] 08/28/2022 3:43 PM    [place]

## 2022-08-28 NOTE — Patient Instructions (Addendum)
Medication Instructions:  Your physician has recommended you make the following change in your medication:   INCREASE Imdur to '120mg'$  daily by mouth    Lab Work: None Ordered If you have labs (blood work) drawn today and your tests are completely normal, you will receive your results only by: [place] (if you have MyChart) OR A paper copy in the mail If you have any lab test that is abnormal or we need to change your treatment, we will call you to review the results.   Testing/Procedures:  WHY IS MY DOCTOR PRESCRIBING ZIO? The Zio system is proven and trusted by physicians to detect and diagnose irregular heart rhythms -- and has been prescribed to hundreds of thousands of patients.  The FDA has cleared the Zio system to monitor for many different kinds of irregular heart rhythms. In a study, physicians were able to reach a diagnosis 90% of the time with the Zio system1.  You can wear the Zio monitor -- a small, discreet, comfortable patch -- during your normal day-to-day activity, including while you sleep, shower, and exercise, while it records every single heartbeat for analysis.  1Barrett, P., et al. Comparison of 24 Hour Holter Monitoring Versus 14 Day Novel Adhesive Patch Electrocardiographic Monitoring. [place], 2014.  ZIO VS. HOLTER MONITORING The Zio monitor can be comfortably worn for up to 14 days. Holter monitors can be worn for 24 to 48 hours, limiting the time to record any irregular heart rhythms you may have. Zio is able to capture data for the 51% of patients who have their first symptom-triggered arrhythmia after 48 hours.1  LIVE WITHOUT RESTRICTIONS The Zio ambulatory cardiac monitor is a small, unobtrusive, and water-resistant patch--you might even forget you're wearing it. The Zio monitor records and stores every beat of your heart, whether you're sleeping, working out, or showering.     Follow-Up: At [facility], you and your health  needs are our priority.  As part of our continuing mission to provide you with exceptional heart care, we have created designated Provider Care Teams.  These Care Teams include your primary Cardiologist (physician) and Advanced Practice Providers (APPs -  Physician Assistants and Nurse Practitioners) who all work together to provide you with the care you need, when you need it.  We recommend signing up for the patient portal called "MyChart".  Sign up information is provided on this After Visit Summary.  MyChart is used to connect with patients for Virtual Visits (Telemedicine).  Patients are able to view lab/test results, encounter notes, upcoming appointments, etc.  Non-urgent messages can be sent to your provider as well.   To learn more about what you can do with MyChart, go to [email or web address].    Your next appointment:   3 month(s)  The format for your next appointment:   In Person  Provider:   Jenne Campus, MD    Other Instructions NA

## 2022-08-31 ENCOUNTER — Encounter: Payer: Self-pay | Admitting: Neurology

## 2022-09-01 ENCOUNTER — Emergency Department (HOSPITAL_COMMUNITY): Payer: PRIVATE HEALTH INSURANCE

## 2022-09-01 ENCOUNTER — Other Ambulatory Visit: Payer: Self-pay

## 2022-09-01 ENCOUNTER — Emergency Department (HOSPITAL_COMMUNITY)
Admission: EM | Admit: 2022-09-01 | Discharge: 2022-09-01 | Disposition: A | Discharge status: Home / Self Care | Payer: PRIVATE HEALTH INSURANCE | Attending: Emergency Medicine | Admitting: Emergency Medicine

## 2022-09-01 ENCOUNTER — Telehealth: Payer: Self-pay

## 2022-09-01 ENCOUNTER — Other Ambulatory Visit: Payer: Self-pay | Admitting: Cardiology

## 2022-09-01 DIAGNOSIS — I25810 Atherosclerosis of coronary artery bypass graft(s) without angina pectoris: Secondary | ICD-10-CM | POA: Insufficient documentation

## 2022-09-01 DIAGNOSIS — R42 Dizziness and giddiness: Secondary | ICD-10-CM | POA: Diagnosis not present

## 2022-09-01 DIAGNOSIS — E119 Type 2 diabetes mellitus without complications: Secondary | ICD-10-CM | POA: Diagnosis not present

## 2022-09-01 DIAGNOSIS — R079 Chest pain, unspecified: Secondary | ICD-10-CM

## 2022-09-01 DIAGNOSIS — I200 Unstable angina: Secondary | ICD-10-CM

## 2022-09-01 DIAGNOSIS — Z7984 Long term (current) use of oral hypoglycemic drugs: Secondary | ICD-10-CM | POA: Diagnosis not present

## 2022-09-01 DIAGNOSIS — Z951 Presence of aortocoronary bypass graft: Secondary | ICD-10-CM | POA: Diagnosis not present

## 2022-09-01 DIAGNOSIS — Z7902 Long term (current) use of antithrombotics/antiplatelets: Secondary | ICD-10-CM | POA: Diagnosis not present

## 2022-09-01 DIAGNOSIS — I10 Essential (primary) hypertension: Secondary | ICD-10-CM | POA: Diagnosis not present

## 2022-09-01 DIAGNOSIS — R072 Precordial pain: Secondary | ICD-10-CM

## 2022-09-01 DIAGNOSIS — Z7982 Long term (current) use of aspirin: Secondary | ICD-10-CM | POA: Diagnosis not present

## 2022-09-01 DIAGNOSIS — Z79899 Other long term (current) drug therapy: Secondary | ICD-10-CM | POA: Diagnosis not present

## 2022-09-01 LAB — BASIC METABOLIC PANEL
Anion gap: 10 (ref 5–15)
BUN: 9 mg/dL (ref 6–20)
CO2: 22 mmol/L (ref 22–32)
Calcium: 9.2 mg/dL (ref 8.9–10.3)
Chloride: 103 mmol/L (ref 98–111)
Creatinine, Ser: 0.55 mg/dL (ref 0.44–1.00)
GFR, Estimated: >60 mL/min (ref >60)
Glucose, Bld: 269 mg/dL — ABNORMAL HIGH (ref 70–99)
Potassium: 4.2 mmol/L (ref 3.5–5.1)
Sodium: 135 mmol/L (ref 135–145)

## 2022-09-01 LAB — I-STAT BETA HCG BLOOD, ED (MC, WL, AP ONLY): I-stat hCG, quantitative: <5 m[IU]/mL (ref <5)

## 2022-09-01 LAB — CBC
HCT: 39.8 % (ref 36.0–46.0)
Hemoglobin: 12 g/dL (ref 12.0–15.0)
MCH: 21.8 pg — ABNORMAL LOW (ref 26.0–34.0)
MCHC: 30.2 g/dL (ref 30.0–36.0)
MCV: 72.2 fL — ABNORMAL LOW (ref 80.0–100.0)
Platelets: 233 10*3/uL (ref 150–400)
RBC: 5.51 MIL/uL — ABNORMAL HIGH (ref 3.87–5.11)
RDW: 20.1 % — ABNORMAL HIGH (ref 11.5–15.5)
WBC: 8.3 10*3/uL (ref 4.0–10.5)
nRBC: 0 % (ref 0.0–0.2)

## 2022-09-01 LAB — TROPONIN I (HIGH SENSITIVITY)
Troponin I (High Sensitivity): 10 ng/L (ref <18)
Troponin I (High Sensitivity): 10 ng/L (ref <18)

## 2022-09-01 MED ORDER — CARVEDILOL 12.5 MG PO TABS: 25.0000 mg | ORAL_TABLET | Freq: Two times a day (BID) | ORAL | 1 refills | Status: DC

## 2022-09-01 NOTE — Telephone Encounter (Signed)
Spoke with Pt after ED visit with Dr. Burt Knack. She will be scheduled for a Lexi Scan in the office. Dr. Agustin Cree reviewed note and agreed. Reviewed instructions with pt and sent by My Chart. Pt verbalized understanding and had no questions.

## 2022-09-01 NOTE — Discharge Instructions (Addendum)
You were evaluated in the Emergency Department and after careful evaluation, we did not find any emergent condition requiring admission or further testing in the hospital.  As discussed, cardiology would like to increase your Coreg to 25 mg twice daily.  Please take this with caution in the setting of your recent dizziness.  Please make sure to follow-up with Dr. Agustin Cree.  Please return to the Emergency Department if you experience any worsening of your condition.  We encourage you to follow up with a primary care provider.  Thank you for allowing [place] to be a part of your care.'

## 2022-09-01 NOTE — ED Triage Notes (Signed)
Pt. Stated, I hada chest pain last night now its a discomfort. My BP is also elevated.

## 2022-09-01 NOTE — ED Notes (Signed)
Pt verbalizes understanding of discharge instructions. Opportunity for questions and answers were provided. Pt discharged from the ED.   ?

## 2022-09-01 NOTE — ED Provider Notes (Signed)
[facility] EMERGENCY DEPARTMENT Provider Note   CSN: [id number] Arrival date & time: 09/01/22  4235     History  Chief Complaint  Patient presents with   Chest Pain    [name] is a 46 y.o. female.  HPI 46 year old female with a history of anemia, tobacco abuse, cardiomyopathy, NSTEMI, dyslipidemia, anxiety, DM type II presents to the ER with complaints of chest pain.  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  She states that she had some centralized chest pain that woke her from her sleep that went between her shoulder blades last night, took some nitroglycerin and this did improve.  She stated that this morning however she still felt "not quite herself", describing it as jittery, weak, with dull aching in her chest, had a coworker check her blood pressure and it was 165/99 and was urged to come to the emergency department.  Per chart review, she was seen here 9/5 with complaints of dizziness, did have a known head injury, CT head was negative.  She followed up with Dr. Agustin Cree with cardiology and had a Zio patch placed on 9/8.  She reports compliance with her blood pressure medications this morning.  She denies any headache, nausea, vomiting, left arm tingling (states that with her NSTEMI she did have some left arm symptoms), abdominal pain, syncope/near syncope.    Home Medications Prior to Admission medications   Medication Sig Start Date End Date Taking? Authorizing Provider  aspirin EC 81 MG tablet Take 1 tablet (81 mg total) by mouth daily. Swallow whole. 06/27/20   Park Liter, MD  atorvastatin (LIPITOR) 80 MG tablet Take 1 tablet (80 mg total) by mouth daily. Patient taking differently: Take 80 mg by mouth at bedtime. 10/29/21   Park Liter, MD  busPIRone (BUSPAR) 15 MG tablet Take 15 mg by mouth 2 (two) times daily.    [provider]   carvedilol (COREG) 12.5 MG tablet Take 2 tablets (25 mg total) by mouth 2 (two) times daily with a meal. 09/01/22   Garald Balding, PA-C  CHLOROPHYLL PO Take 1,200 mg by mouth daily.    [provider]  Cholecalciferol (D3 5000) 125 MCG (5000 UT) capsule Take 5,000 Units by mouth every morning.    [provider]  cilostazol (PLETAL) 50 MG tablet Take 1 tablet (50 mg total) by mouth 2 (two) times daily. 12/02/21   Lorretta Harp, MD  clopidogrel (PLAVIX) 75 MG tablet Take 1 tablet (75 mg total) by mouth daily with breakfast. 12/30/21   Lorretta Harp, MD  cyanocobalamin (,VITAMIN B-12,) 1000 MCG/ML injection Inject 1,000 mcg into the muscle every 30 (thirty) days. 12/02/21   [provider]  escitalopram (LEXAPRO) 10 MG tablet Take 10 mg by mouth daily.    [provider]  gabapentin (NEURONTIN) 300 MG capsule Take 600 mg by mouth 2 (two) times daily. 12/03/21   [provider]  isosorbide mononitrate (IMDUR) 120 MG 24 hr tablet Take 1 tablet (120 mg total) by mouth daily. 08/28/22   Park Liter, MD  losartan (COZAAR) 25 MG tablet Take 1 tablet (25 mg total) by mouth daily. 07/06/22   Park Liter, MD  Magnesium 400 MG TABS Take 1 tablet by mouth daily.    [provider]  metFORMIN (GLUCOPHAGE-XR) 500 MG 24 hr tablet Take 2 tablets (1,000 mg total)  by mouth 2 (two) times daily. 12/31/21   Lorretta Harp, MD  nitroGLYCERIN (NITROSTAT) 0.4 MG SL tablet Place 1 tablet (0.4 mg total) under the tongue every 5 (five) minutes as needed. Patient taking differently: Place 0.4 mg under the tongue every 5 (five) minutes as needed for chest pain. 10/29/21 08/28/22  Park Liter, MD  ranolazine (RANEXA) 1000 MG SR tablet Take 1 tablet by mouth twice daily Patient taking differently: Take 500 mg by mouth 2 (two) times daily. 04/16/22   Park Liter, MD      Allergies    Patient has no known allergies.    Review of Systems    Review of Systems Ten systems reviewed and are negative for acute change, except as noted in the HPI.   Physical Exam Updated Vital Signs BP (!) 140/66   Pulse 70   Temp 98.3 F (36.8 C)   Resp 19   Ht '5\' 7"'$  (1.702 m)   Wt 98.9 kg   LMP 08/26/2022   SpO2 95%   BMI 34.14 kg/m  Physical Exam Vitals reviewed.  Constitutional:      Appearance: Normal appearance.  HENT:     Head: Normocephalic.  Cardiovascular:     Rate and Rhythm: Normal rate and regular rhythm.     Pulses: Normal pulses.     Heart sounds: Normal heart sounds, S1 normal and S2 normal. No murmur heard.    No friction rub.  Pulmonary:     Effort: Pulmonary effort is normal.     Breath sounds: Normal breath sounds.  Abdominal:     General: Abdomen is flat.     Palpations: Abdomen is soft.  Musculoskeletal:     Right lower leg: No edema.     Left lower leg: No edema.     Comments: Compression socks on bilaterally, no pitting edema  Neurological:     General: No focal deficit present.     Mental Status: She is alert.     ED Results / Procedures / Treatments   Labs (all labs ordered are listed, but only abnormal results are displayed) Labs Reviewed  BASIC METABOLIC PANEL - Abnormal; Notable for the following components:      Result Value   Glucose, Bld 269 (*)    All other components within normal limits  CBC - Abnormal; Notable for the following components:   RBC 5.51 (*)    MCV 72.2 (*)    MCH 21.8 (*)    RDW 20.1 (*)    All other components within normal limits  I-STAT BETA HCG BLOOD, ED ([facility], [facility], [facility] ONLY)  TROPONIN I (HIGH SENSITIVITY)  TROPONIN I (HIGH SENSITIVITY)    EKG EKG Interpretation  Date/Time:  Tuesday September 01 2022 07:06:03 EDT Ventricular Rate:  90 PR Interval:  162 QRS Duration: 80 QT Interval:  350 QTC Calculation: 428 R Axis:   25 Text Interpretation: Normal sinus rhythm ST & T wave abnormality, consider inferolateral ischemia Abnormal ECG When compared with ECG of  25-Aug-2022 18:35, PREVIOUS ECG IS PRESENT Confirmed by Gerlene Fee [phone]) on 09/01/2022 7:16:02 AM  Radiology DG Chest 2 View  Result Date: 09/01/2022 CLINICAL DATA:  Chest pain and shortness of breath. EXAM: CHEST - 2 VIEW COMPARISON:  08/25/2022 FINDINGS: The lungs are clear without focal pneumonia, edema, pneumothorax or pleural effusion. The cardiopericardial silhouette is within normal limits for size. The visualized bony structures of the thorax are unremarkable. IMPRESSION: No active cardiopulmonary disease. Electronically Signed  By: Misty Stanley M.D.   On: 09/01/2022 08:08    Procedures Procedures    Medications Ordered in ED Medications - No data to display  ED Course/ Medical Decision Making/ A&P                           Medical Decision Making Amount and/or Complexity of Data Reviewed Labs: ordered. Radiology: ordered.   46 year old female presenting to the ER with complaints of chest pain.  On arrival, she is slightly hypertensive with a blood pressure 175/99.  Physical exam is unremarkable.  She is comfortable and nontoxic-appearing.  Differential diagnosis includes stable versus unstable angina, ACS, dissection, PE, GERD, anxiety, pneumonia, pneumothorax, myocarditis/pericarditis  Labs ordered, reviewed and interpreted by me CBC without leukocytosis BMP without any electrode abnormalities, normal renal function Delta troponin negative  Chest x-ray ordered, reviewed, agree with radiology read, no acute findings  EKG reviewed, no ischemic changes  Given patient's significant cardiac history and recent visit with cardiology, I consulted them.  They evaluated her at bedside, given reassuring troponins, no indication for admission at this time.  I did recommend to increase her Coreg to 25 mg twice daily however they are aware of her recent visit for dizziness.  Low suspicion for ACS, PE, dissection, pneumonia.  She was encouraged to take this but with caution.  I  encouraged her to follow-up in the office for Dr. Agustin Cree.  We discussed return precautions.  She was understanding and is agreeable.  Stable for discharge.  Final Clinical Impression(s) / ED Diagnoses Final diagnoses:  Chest pain, unspecified type    Rx / DC Orders ED Discharge Orders          Ordered    carvedilol (COREG) 12.5 MG tablet  2 times daily with meals        09/01/22 1250              Lyndel Safe 09/01/22 1255    Carmin Muskrat, MD 09/02/22 1554

## 2022-09-01 NOTE — Consult Note (Addendum)
Cardiology Consultation   Patient ID: [name] MRN: [id number]; DOB: [birth year]  Admit date: 09/01/2022 Date of Consult: 09/01/2022  PCP:  Barnetta Chapel, NP   [facility] Cardiologist:  Jenne Campus, MD        Patient Profile:   [name] is a 46 y.o. female with a hx of coronary artery disease status post 2017 CABG with LIMA to LAD SVG to RCA, essential hypertension, type 2 diabetes, active smoker who is being seen 09/01/2022 for the evaluation of chest pain at the request of Dr Vanita Panda.  History of Present Illness:   Ms. [name] presented to the emergency department from work this morning.  Patient reports that she went to bed around 930 last night.  Around 10 PM she began experiencing chest pain described as squeezing and rated 6 out of 10.  She does endorse some shortness of breath with this pain.  Pain improved when she sat up on the side of the bed and fully resolved after she took nitro.  Patient then attempted to return to sleep and states that she awoke around 1145 with worsening squeezing chest pain rated 9 out of 10.  This pain radiated to her back.  Pain again resolved with sublingual nitro.  Patient states that she return to sleep without issue but awoke feeling weak and "jittery."  Patient states that she went to work but continued to feel poorly with a slight headache.  She states that her blood pressure was checked and found to be elevated.  Patient recalls a blood pressure of around 164/100 mmHg. patient does endorse compliance with home blood pressure regimen and says she took all of her morning medicines at around 4:40 this morning.  Patient was recently seen in the emergency department on 9/5 due to symptoms of orthostatic dizziness, described as tunnel vision.  Work-up including CT head (obtained due to patient reporting recent head injury) and troponin were all negative.  Patient followed up with her primary cardiologist Dr. Agustin Cree on  9/8.  Dr. Agustin Cree was of the opinion that patient's symptoms were likely related to her head trauma, possible concussion.  However decision was made to increase her daily Imdur dose to 120 mg to help with both chest pain and hypertension.  She was also given a Zio patch to wear.  At the time of my exam in the emergency department, patient denies all symptoms of chest pain, shortness of breath, palpitations, dizziness.  Also denies GI symptoms.  She does not report a recurrence of the dizziness that prompted her ED visit last week.  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  Patient states that due to staffing issues she often has to help move boxes of food.  Patient says that she is able to do so without significant difficulty.  However in the last 6 months she has occasionally experienced some chest pain while exerting herself.  This pain resolves upon resting.   Past Medical History:  Diagnosis Date   Abnormal stress test 08/20/2016   Formatting of this note might be different from the original. Added automatically from request for surgery [id number]   Acute appendicitis 01/28/2016   Acute appendicitis with localized peritonitis 01/28/2016   Acute non-ST segment elevation myocardial infarction [facility]) 03/17/2021   Anemia    Angina pectoris ([place]) 06/27/2020   Anxiety disorder 03/17/2021   Calculus of gallbladder 06/05/2016   Cardiomyopathy ([place])    "postpartem cardiomyopathy"- 2012- has since been released  by cardiology   Coronary artery disease 06/27/2020   COVID 10/14/2020   c/o cough and chest pressure    Depression    Dyslipidemia 06/27/2020   Dyspnea on exertion 06/27/2020   History of coronary artery bypass graft 09/22/2016   Formatting of this note might be different from the original. LIMA to LAD, SVG to PDA in September 2017 at [facility]   Hyperglycemia due to type 2 diabetes mellitus ([place]) 02/25/2016   Hypertension    Liver abscess     2012- Terrial Rhodes drained placed for awhile.   Long-term insulin use ([place]) 01/26/2017   Mixed hyperlipidemia 02/25/2016   NSTEMI (non-ST elevated myocardial infarction) ([place]) 06/07/2020   Obesity 09/18/2011   PCOS (polycystic ovarian syndrome) 02/25/2016   Persistent microalbuminuria associated with type 2 diabetes mellitus ([place]) 02/25/2016   Renal failure 09/25/2011   Due to vancomycin, IV contrast, sepsis.     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  Was hospitalized, intubated, had cardiopulmonary failure.    Tobacco abuse    Type 2 diabetes mellitus with hyperglycemia ([place]) 02/25/2016    Past Surgical History:  Procedure Laterality Date   ABDOMINAL AORTOGRAM W/LOWER EXTREMITY N/A 12/29/2021   Procedure: ABDOMINAL AORTOGRAM W/LOWER EXTREMITY;  Surgeon: Lorretta Harp, MD;  Location: [place] [facility];  Service: Cardiovascular;  Laterality: N/A;   APPENDECTOMY     CORONARY ARTERY BYPASS GRAFT  2017   [facility]   CRYOTHERAPY     56's- cervix   DILATION AND CURETTAGE OF UTERUS  1994   LAPAROSCOPIC APPENDECTOMY N/A 01/28/2016   Procedure: APPENDECTOMY LAPAROSCOPIC;  Surgeon: Stark Klein, MD;  Location: [place];  Service: General;  Laterality: N/A;   LEFT HEART CATH AND CORS/GRAFTS ANGIOGRAPHY N/A 06/07/2020   Procedure: LEFT HEART CATH AND CORS/GRAFTS ANGIOGRAPHY;  Surgeon: Martinique, Peter M, MD;  Location: [place] [facility];  Service: Cardiovascular;  Laterality: N/A;   PERIPHERAL VASCULAR INTERVENTION  12/29/2021   Procedure: PERIPHERAL VASCULAR INTERVENTION;  Surgeon: Lorretta Harp, MD;  Location: [place] [facility];  Service: Cardiovascular;;  Rt. Iliac   ROBOTIC ASSISTED SALPINGO OOPHERECTOMY Right 03/24/2016   Procedure: XI ROBOTIC  ASSISTED LAPAROSCOPIC RIGHT SALPINGO OOPHORECTOMY;  Surgeon: Everitt Amber, MD;  Location: [facility];  Service: Gynecology;  Laterality: Right;   TONSILLECTOMY       Home Medications:  Prior to Admission medications   Medication Sig Start Date End Date Taking? Authorizing Provider  aspirin EC 81 MG tablet Take 1 tablet (81 mg total) by mouth daily. Swallow whole. 06/27/20   Park Liter, MD  atorvastatin (LIPITOR) 80 MG tablet Take 1 tablet (80 mg total) by mouth daily. Patient taking differently: Take 80 mg by mouth at bedtime. 10/29/21   Park Liter, MD  busPIRone (BUSPAR) 15 MG tablet Take 15 mg by mouth 2 (two) times daily.    [provider]  carvedilol (COREG) 12.5 MG tablet TAKE 1 TABLET BY MOUTH TWICE DAILY WITH A MEAL 07/06/22   Park Liter, MD  CHLOROPHYLL PO Take 1,200 mg by mouth daily.    [provider]  Cholecalciferol (D3 5000) 125 MCG (5000 UT) capsule Take 5,000 Units by mouth every morning.    [provider]  cilostazol (PLETAL) 50 MG tablet Take 1 tablet (50 mg total) by mouth 2 (two) times daily. 12/02/21   Lorretta Harp, MD  clopidogrel (PLAVIX) 75 MG tablet Take 1 tablet (75 mg total) by mouth daily with breakfast. 12/30/21   Lorretta Harp, MD  cyanocobalamin (,VITAMIN B-12,) 1000 MCG/ML injection Inject 1,000 mcg into the muscle every 30 (thirty) days. 12/02/21   [provider]  escitalopram (LEXAPRO) 10 MG tablet Take 10 mg by mouth daily.    [provider]  gabapentin (NEURONTIN) 300 MG capsule Take 600 mg by mouth 2 (two) times daily. 12/03/21   [provider]  isosorbide mononitrate (IMDUR) 120 MG 24 hr tablet Take 1 tablet (120 mg total) by mouth daily. 08/28/22   Park Liter, MD  losartan (COZAAR) 25 MG tablet Take 1 tablet (25 mg total) by mouth daily. 07/06/22   Park Liter, MD  Magnesium 400 MG TABS Take 1 tablet by mouth daily.    [provider]  metFORMIN  (GLUCOPHAGE-XR) 500 MG 24 hr tablet Take 2 tablets (1,000 mg total) by mouth 2 (two) times daily. 12/31/21   Lorretta Harp, MD  nitroGLYCERIN (NITROSTAT) 0.4 MG SL tablet Place 1 tablet (0.4 mg total) under the tongue every 5 (five) minutes as needed. Patient taking differently: Place 0.4 mg under the tongue every 5 (five) minutes as needed for chest pain. 10/29/21 08/28/22  Park Liter, MD  ranolazine (RANEXA) 1000 MG SR tablet Take 1 tablet by mouth twice daily Patient taking differently: Take 500 mg by mouth 2 (two) times daily. 04/16/22   Park Liter, MD    Inpatient Medications: Scheduled Meds:  Continuous Infusions:  PRN Meds:   Allergies:   No Known Allergies  Social History:   Social History   Socioeconomic History   Marital status: Single    Spouse name: Not on file   Number of children: Not on file   Years of education: Not on file   Highest education level: Not on file  Occupational History   Not on file  Tobacco Use   Smoking status: Every Day    Packs/day: 0.50    Years: 21.00    Total pack years: 10.50    Types: Cigarettes   Smokeless tobacco: Never  Substance and Sexual Activity   Alcohol use: Yes    Comment: rare social   Drug use: No    Comment: x1 episode "Meth" use.   Sexual activity: Not Currently    Birth control/protection: None  Other Topics Concern   Not on file  Social History Narrative   Not on file   Social Determinants of Health   Financial Resource Strain: Not on file  Food Insecurity: Not on file  Transportation Needs: Not on file  Physical Activity: Not on file  Stress: Not on file  Social Connections: Not on file  Intimate Partner Violence: Not on file    Family History:    Family History  Problem Relation Age of Onset   CAD Mother    CAD Father    Diabetes Maternal Grandmother    Diabetes Paternal Grandfather      ROS:  Please see the history of present illness.   All other ROS reviewed and negative.      Physical Exam/Data:   Vitals:   09/01/22 0710 09/01/22 1000 09/01/22 1030 09/01/22 1100  BP:  (!) 142/78 139/76 (!) 155/77  Pulse:  73 83 76  Resp:  '20 17 17  '$ Temp:      TempSrc:      SpO2:  98% 98% 100%  Weight: 98.9 kg     Height: '5\' 7"'$  (1.702 m)      No intake or output data in the 24 hours ending 09/01/22 1129    09/01/2022    7:10 AM 08/28/2022    3:05 PM 08/25/2022    6:15 PM  Last 3 Weights  Weight (lbs) 218 lb 218 lb 212 lb  Weight (kg) 98.884 kg 98.884 kg 96.163 kg     Body mass index is 34.14 kg/m.  General:  Well nourished, well developed, in no acute distress HEENT: normal Neck: no JVD Vascular: No carotid bruits; Distal pulses 2+ bilaterally Cardiac:  normal S1, S2; RRR; no murmur  Lungs:  clear to auscultation bilaterally, no wheezing, rhonchi or rales  Abd: soft, nontender, no hepatomegaly  Ext: no edema Musculoskeletal:  No deformities, BUE and BLE strength normal and equal Skin: warm and dry  Neuro:  CNs 2-12 intact, no focal abnormalities noted Psych:  Normal affect   EKG:  The EKG was personally reviewed and demonstrates: Normal sinus rhythm with stable inferolateral ischemia pattern.  T wave inversions in leads I, II and V6.  This ECG appears consistent with both patient's 9/5 ECG and 5/31 ECG Telemetry:  Telemetry was personally reviewed and demonstrates: Sinus rhythm  Relevant CV Studies: 03/10/22 Echo  IMPRESSIONS     1. Left ventricular ejection fraction, by estimation, is 45 to 50%. The  left ventricle has mildly decreased function. The left ventricle has no  regional wall motion abnormalities. The left ventricular internal cavity  size was moderately dilated. Left  ventricular diastolic parameters are consistent with Grade II diastolic  dysfunction (pseudonormalization). Elevated left atrial pressure.   2. Right ventricular systolic function is mildly reduced. The right  ventricular size is normal. There is normal pulmonary artery  systolic  pressure.   3. Left atrial size was mildly dilated.   4. The mitral valve is normal in structure. Mild mitral valve  regurgitation. No evidence of mitral stenosis.   5. The aortic valve is tricuspid. Aortic valve regurgitation is not  visualized. No aortic stenosis is present.   6. The inferior vena cava is normal in size with greater than 50%  respiratory variability, suggesting right atrial pressure of 3 mmHg.   FINDINGS   Left Ventricle: Left ventricular ejection fraction, by estimation, is 45  to 50%. The left ventricle has mildly decreased function. The left  ventricle has no regional wall motion abnormalities. Global longitudinal  strain performed but not reported based  on interpreter judgement due to suboptimal tracking. The left ventricular  internal cavity size was moderately dilated. There is no left ventricular  hypertrophy. Left ventricular diastolic parameters are consistent with  Grade II diastolic dysfunction  (pseudonormalization). Elevated left atrial pressure.   Right Ventricle: The right ventricular size is normal. No increase in  right ventricular wall thickness. Right ventricular systolic function is  mildly reduced. There is normal pulmonary artery systolic pressure. The  tricuspid regurgitant velocity is 2.64  m/s, and with an assumed right atrial pressure of 3 mmHg, the estimated  right ventricular systolic pressure is 93.8 mmHg.   Left Atrium: Left atrial size was mildly dilated.   Right Atrium: Right atrial size was normal in size.   Pericardium: There is no evidence of pericardial effusion.   Mitral Valve: The mitral valve is normal in  structure. Mild mitral valve  regurgitation. No evidence of mitral valve stenosis.   Tricuspid Valve: The tricuspid valve is normal in structure. Tricuspid  valve regurgitation is mild . No evidence of tricuspid stenosis.   Aortic Valve: The aortic valve is tricuspid. Aortic valve regurgitation is  not  visualized. No aortic stenosis is present.   Pulmonic Valve: The pulmonic valve was normal in structure. Pulmonic valve  regurgitation is not visualized. No evidence of pulmonic stenosis.   Aorta: The aortic root and ascending aorta are structurally normal, with  no evidence of dilitation and the aortic arch was not well visualized.   Venous: The pulmonary veins were not well visualized. The inferior vena  cava is normal in size with greater than 50% respiratory variability,  suggesting right atrial pressure of 3 mmHg.   IAS/Shunts: No atrial level shunt detected by color flow Doppler.    06/07/20 LHC  Ost LAD to Prox LAD lesion is 100% stenosed. 2nd Mrg lesion is 70% stenosed. Prox RCA to Mid RCA lesion is 100% stenosed. SVG graft was visualized by angiography. Origin to Prox Graft lesion is 100% stenosed. LIMA graft was visualized by angiography and is large. The graft exhibits no disease. LV end diastolic pressure is mildly elevated.   1. 3 vessel obstructive CAD    - 100% proximal LAD    - 70% mid to distal OM2    - 100% proximal RCA 2. Occluded SVG to RCA 3. Patent LIMA to LAD 4. Mildly elevated LVEDP   Diagnostic Dominance: Right    Laboratory Data:  High Sensitivity Troponin:   Recent Labs  Lab 08/25/22 1830 08/25/22 2055 09/01/22 0727 09/01/22 0941  TROPONINIHS '8 8 10 10     '$ Chemistry Recent Labs  Lab 08/25/22 1830 09/01/22 0727  NA 135 135  K 4.5 4.2  CL 102 103  CO2 24 22  GLUCOSE 347* 269*  BUN 14 9  CREATININE 0.75 0.55  CALCIUM 9.5 9.2  MG 1.6*  --   GFRNONAA >60 >60  ANIONGAP 9 10    No results for input(s): "PROT", "ALBUMIN", "AST", "ALT", "ALKPHOS", "BILITOT" in the last 168 hours. Lipids No results for input(s): "CHOL", "TRIG", "HDL", "LABVLDL", "LDLCALC", "CHOLHDL" in the last 168 hours.  Hematology Recent Labs  Lab 08/25/22 1830 09/01/22 0727  WBC 8.0 8.3  RBC 5.28* 5.51*  HGB 11.3* 12.0  HCT 38.0 39.8  MCV 72.0* 72.2*   MCH 21.4* 21.8*  MCHC 29.7* 30.2  RDW 20.9* 20.1*  PLT 209 233   Thyroid No results for input(s): "TSH", "FREET4" in the last 168 hours.  BNPNo results for input(s): "BNP", "PROBNP" in the last 168 hours.  DDimer No results for input(s): "DDIMER" in the last 168 hours.   Radiology/Studies:  DG Chest 2 View  Result Date: 09/01/2022 CLINICAL DATA:  Chest pain and shortness of breath. EXAM: CHEST - 2 VIEW COMPARISON:  08/25/2022 FINDINGS: The lungs are clear without focal pneumonia, edema, pneumothorax or pleural effusion. The cardiopericardial silhouette is within normal limits for size. The visualized bony structures of the thorax are unremarkable. IMPRESSION: No active cardiopulmonary disease. Electronically Signed   By: Misty Stanley M.D.   On: 09/01/2022 08:08     Assessment and Plan:   Chest pain Essential hypertension, uncontrolled  Patient with 2 episodes of chest pain occurring late in the evening yesterday while she tried to sleep.  Pain described as a squeezing sensation and was as severe as 9/10 with radiation to  her back.  Pain was responsive to nitro.  Troponins obtained in the emergency department are flat at 10.  CXR without acute cardiopulmonary abnormalities.  Given negative troponins and stable ECG, symptoms most likely due to patient's uncontrolled hypertension. Her current antihypertensive regimen includes Coreg 12.5 mg twice daily, Imdur 120 mg once daily, losartan 25 mg once daily.  Timing of up titration of patient's hypertensive regimen is not ideal given recently reported dizziness.  However given chest pain with elevated blood pressures, it is reasonable to increase her Coreg to 25 mg twice daily.  Patient should be instructed to keep a blood pressure log and bring this to follow-up with her cardiology team. Follow up requested.  CAD with PVD  Continue daily aspirin 81 mg, Pletal 50 mg twice daily, Plavix 75 mg once daily.   Chronic HFmrEF  Patient's most  recent echo from 03/10/2022 shows a moderate reduction in left ventricular ejection fraction, 45 to 50%.  LV diastolic parameters are consistent with grade 2 diastolic dysfunction.  She appears euvolemic on exam today.  In the setting of recent dizziness, would prefer to not start any new medications.  However, both from a heart failure and hypertension perspective, patient could be considered for future outpatient initiation of an MRA.    For questions or updates, please contact [place] Please consult [email or web address] for contact info under    Signed, Lily Kocher, PA-C  09/01/2022 11:29 AM  Patient seen, examined. Available data reviewed. Agree with findings, assessment, and plan as outlined by Lily Kocher, PA-C.  The patient is independently interviewed and examined.  Her mother is present at the bedside.  The patient is alert, oriented, in no acute distress.  HEENT is normal, JVP is normal, carotid upstrokes are normal without bruits, lungs have no inspiratory crackles but there are expiratory rhonchi present, cardiovascular is regular rate and rhythm with no murmur gallop, abdomen is soft nontender, extremities have trace edema bilaterally.  Skin is warm and dry with no rash.  EKG shows normal sinus rhythm with left ventricular hypertrophy and ST and T abnormality consider lateral ischemia, unchanged from the prior tracing.  High-sensitivity troponin is negative x2.  The patient's history is consistent with angina at rest with typical and atypical features.  Her objective markers are negative.  Her last heart catheterization in 2021 showed continued patency of her LIMA graft, chronic occlusion of the native RCA and saphenous vein graft RCA, and left to right collaterals.  There is no high-grade obstruction in the left main and the left circumflex has moderate distal vessel stenosis.  With negative biomarkers, favor an approach of medical therapy.  After reviewing her antianginal  program, recommend increase carvedilol to 25 mg twice daily.  She will remain on her other medications without change.  Consideration could be made to increasing her losartan for better blood pressure control if she remains above goal on carvedilol.  Will arrange an outpatient [place] stress test for further risk stratification and a follow-up office visit with her primary cardiologist, Dr. Agustin Cree.  We also had extensive discussion about the importance of complete tobacco cessation.  The patient understands that with her extensive premature coronary artery disease, type 2 diabetes, and continued smoking, she is at extremely high risk of progressive cardiac and vascular disease.  She already has peripheral arterial disease with intermittent claudication and extensive CAD outlined above.  I emphasized the fact that tobacco cessation is her most important modifiable risk factor moving forward.  The plan is reviewed with the patient and her mother, both expressed understanding.  Sherren Mocha, M.D. 09/01/2022 1:51 PM

## 2022-09-03 ENCOUNTER — Telehealth: Payer: Self-pay

## 2022-09-03 NOTE — Telephone Encounter (Signed)
Spoke with the patient, detailed instructions were given. She stated that she would be here for her test. Asked to call back with any questions. S.Alnita Aybar EMTP 

## 2022-09-09 ENCOUNTER — Ambulatory Visit: Payer: PRIVATE HEALTH INSURANCE | Attending: Cardiology

## 2022-09-09 DIAGNOSIS — R072 Precordial pain: Secondary | ICD-10-CM | POA: Diagnosis not present

## 2022-09-09 LAB — MYOCARDIAL PERFUSION IMAGING
LV dias vol: 156 mL (ref 46–106)
LV sys vol: 84 mL
Nuc Stress EF: 46 %
Peak HR: 100 {beats}/min
Rest HR: 83 {beats}/min
Rest Nuclear Isotope Dose: 10.7 mCi
SDS: 4
SRS: 9
SSS: 13
ST Depression (mm): 0 mm
Stress Nuclear Isotope Dose: 30.4 mCi
TID: 1.14

## 2022-09-09 MED ORDER — TECHNETIUM TC 99M TETROFOSMIN IV KIT
10.7000 | PACK | Freq: Once | INTRAVENOUS | Status: AC | PRN
  Administered 2022-09-09: 10.7 via INTRAVENOUS

## 2022-09-09 MED ORDER — TECHNETIUM TC 99M TETROFOSMIN IV KIT
30.4000 | PACK | Freq: Once | INTRAVENOUS | Status: AC | PRN
  Administered 2022-09-09: 30.4 via INTRAVENOUS

## 2022-09-09 MED ORDER — REGADENOSON 0.4 MG/5ML IV SOLN
0.4000 mg | Freq: Once | INTRAVENOUS | Status: AC
  Administered 2022-09-09: 0.4 mg via INTRAVENOUS

## 2022-09-11 ENCOUNTER — Telehealth: Payer: Self-pay

## 2022-09-11 NOTE — Telephone Encounter (Signed)
-----   Message from Park Liter, MD sent at 09/11/2022 12:55 PM EDT ----- Stress test showing old MI, no ischemia

## 2022-09-11 NOTE — Telephone Encounter (Signed)
Patient notified of results.

## 2022-09-15 ENCOUNTER — Other Ambulatory Visit: Payer: Self-pay

## 2022-09-16 ENCOUNTER — Ambulatory Visit: Payer: PRIVATE HEALTH INSURANCE | Admitting: Cardiology

## 2022-09-28 ENCOUNTER — Ambulatory Visit: Payer: PRIVATE HEALTH INSURANCE | Admitting: Neurology

## 2022-10-14 ENCOUNTER — Telehealth: Payer: Self-pay

## 2022-10-14 NOTE — Telephone Encounter (Signed)
Left My Chart message regarding normal results.

## 2022-11-27 ENCOUNTER — Other Ambulatory Visit: Payer: Self-pay | Admitting: Cardiology

## 2022-11-27 DIAGNOSIS — I209 Angina pectoris, unspecified: Secondary | ICD-10-CM

## 2022-11-27 DIAGNOSIS — E1165 Type 2 diabetes mellitus with hyperglycemia: Secondary | ICD-10-CM

## 2022-12-23 ENCOUNTER — Ambulatory Visit: Payer: PRIVATE HEALTH INSURANCE | Attending: Cardiology | Admitting: Cardiology

## 2023-10-01 DIAGNOSIS — J441 Chronic obstructive pulmonary disease with (acute) exacerbation: Secondary | ICD-10-CM | POA: Diagnosis not present

## 2023-10-01 DIAGNOSIS — I509 Heart failure, unspecified: Secondary | ICD-10-CM | POA: Diagnosis not present

## 2023-10-01 DIAGNOSIS — I340 Nonrheumatic mitral (valve) insufficiency: Secondary | ICD-10-CM | POA: Diagnosis not present

## 2023-10-01 DIAGNOSIS — Z720 Tobacco use: Secondary | ICD-10-CM | POA: Diagnosis not present

## 2023-10-01 DIAGNOSIS — I2510 Atherosclerotic heart disease of native coronary artery without angina pectoris: Secondary | ICD-10-CM | POA: Diagnosis not present

## 2023-10-02 DIAGNOSIS — R079 Chest pain, unspecified: Secondary | ICD-10-CM | POA: Diagnosis not present

## 2023-10-02 DIAGNOSIS — I2510 Atherosclerotic heart disease of native coronary artery without angina pectoris: Secondary | ICD-10-CM | POA: Diagnosis not present

## 2023-10-02 DIAGNOSIS — Z720 Tobacco use: Secondary | ICD-10-CM | POA: Diagnosis not present

## 2023-10-02 DIAGNOSIS — I509 Heart failure, unspecified: Secondary | ICD-10-CM | POA: Diagnosis not present

## 2023-10-02 DIAGNOSIS — J441 Chronic obstructive pulmonary disease with (acute) exacerbation: Secondary | ICD-10-CM | POA: Diagnosis not present

## 2023-11-01 ENCOUNTER — Ambulatory Visit: Payer: PRIVATE HEALTH INSURANCE | Attending: Cardiovascular Disease | Admitting: Cardiovascular Disease

## 2023-11-01 ENCOUNTER — Ambulatory Visit: Payer: Medicaid Other | Admitting: Cardiology

## 2023-11-01 ENCOUNTER — Encounter: Payer: Self-pay | Admitting: Cardiovascular Disease

## 2023-11-01 VITALS — BP 138/80 | HR 82 | Ht 67.0 in | Wt 207.0 lb

## 2023-11-01 DIAGNOSIS — I739 Peripheral vascular disease, unspecified: Secondary | ICD-10-CM

## 2023-11-01 DIAGNOSIS — I10 Essential (primary) hypertension: Secondary | ICD-10-CM | POA: Diagnosis not present

## 2023-11-01 NOTE — Progress Notes (Signed)
11/01/2023 [name]   26-Jan-1976  [id number]  Primary Physician Alveria Apley, NP (Inactive) Primary Cardiologist: Runell Gess MD Nicholes Calamity, [place]  HPI:  [name] is a 47 y.o.  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  She was referred by Dr. Bing Matter, her cardiologist, for evaluation of lifestyle limiting claudication/PAD.  I last saw her in the office 01/14/2022.  Risk factors include 30 to 50 pack years of tobacco abuse continuing to smoke 1 to 1-1/2 packs a day.  She has treated hypertension, hyperlipidemia and diabetes.  She has had a heart attack in the past but no stroke.  She had bypass surgery in 2017 and had cardiac catheterization performed by Dr. [place] June 2021 with medical treatment recommended.  She gets occasional nitrate risks responsive chest pain.  She is complained of right greater than left lower extremity claudication for the last year.  She did have recent Dopplers that ruled out DVT but suggested SFA disease bilaterally.  I obtained lower extremity arterial Doppler studies on 12/16/2021 revealing right ABI of 0.45 and a left of 0.65.  She had a high-frequency signal at the origin of her right common iliac artery, occluded right SFA and high-grade distal left common femoral artery stenosis.  She has right greater than left lower extremity claudication which has been lifestyle limiting and progressive for over a year.  e wishes to proceed with endovascular therapy.  I performed peripheral angiography on her 12/29/2021 revealing a subtotally occluded right common iliac artery which I stented using a 7 mm x 39 mm long VBX covered stent.  She did have a totally occluded right SFA from the origin down to the adductor canal with three-vessel runoff as well as a 95% distal left common femoral artery stenosis with two-vessel runoff.  Her  claudication symptoms have markedly improved as has her left ABI from 0.45 up to 0.77.  Since I saw her 2 years ago she does continue to smoke a half a pack a day which is somewhat improved.  Her claudication has remained fairly stable.  She complains of mild claudication on the right as well is on the left.  Her last lower extremity arterial Doppler study performed 06/29/2021 revealed a patent right common iliac artery stent.  She apparently was hospitalized in October at [facility] with pleural effusion.  2D echo revealed normal LV systolic function and apparently a perfusion study showed an abnormality for which she is following up with Dr. Bing Matter in the next couple weeks.   Current Meds  Medication Sig   aspirin EC 81 MG tablet Take 1 tablet (81 mg total) by mouth daily. Swallow whole.   atorvastatin (LIPITOR) 80 MG tablet Take 1 tablet (80 mg total) by mouth daily. (Patient taking differently: Take 80 mg by mouth at bedtime.)   busPIRone (BUSPAR) 15 MG tablet Take 15 mg by mouth 2 (two) times daily.   carvedilol (COREG) 12.5 MG tablet Take 2 tablets (25 mg total) by mouth 2 (two) times daily with a meal.   clopidogrel (PLAVIX) 75 MG tablet Take 1 tablet (75 mg total) by mouth daily with breakfast.   empagliflozin (JARDIANCE) 25 MG TABS tablet Take 25 mg by mouth daily.   gabapentin (NEURONTIN) 300 MG capsule Take 600 mg by mouth 2 (two) times daily.   glipiZIDE (GLUCOTROL) 10 MG tablet Take 10 mg by  mouth daily before breakfast.   losartan (COZAAR) 25 MG tablet Take 1 tablet (25 mg total) by mouth daily.   metFORMIN (GLUCOPHAGE-XR) 500 MG 24 hr tablet Take 2 tablets (1,000 mg total) by mouth 2 (two) times daily.   nitroGLYCERIN (NITROSTAT) 0.4 MG SL tablet DISSOLVE ONE TABLET UNDER THE TONGUE EVERY 5 MINUTES AS NEEDED   SV IRON 325 MG tablet Take 325 mg by mouth daily.     No Known Allergies  Social History   Socioeconomic History   Marital status: Single    Spouse name: Not  on file   Number of children: Not on file   Years of education: Not on file   Highest education level: Not on file  Occupational History   Not on file  Tobacco Use   Smoking status: Every Day    Current packs/day: 0.50    Average packs/day: 0.5 packs/day for 21.0 years (10.5 ttl pk-yrs)    Types: Cigarettes   Smokeless tobacco: Never  Substance and Sexual Activity   Alcohol use: Yes    Comment: rare social   Drug use: No    Comment: x1 episode "Meth" use.   Sexual activity: Not Currently    Birth control/protection: None  Other Topics Concern   Not on file  Social History Narrative   Not on file   Social Determinants of Health   Financial Resource Strain: Not on file  Food Insecurity: Not on file  Transportation Needs: Not on file  Physical Activity: Not on file  Stress: Not on file  Social Connections: Not on file  Intimate Partner Violence: Not on file     Review of Systems: General: negative for chills, fever, night sweats or weight changes.  Cardiovascular: negative for chest pain, dyspnea on exertion, edema, orthopnea, palpitations, paroxysmal nocturnal dyspnea or shortness of breath Dermatological: negative for rash Respiratory: negative for cough or wheezing Urologic: negative for hematuria Abdominal: negative for nausea, vomiting, diarrhea, bright red blood per rectum, melena, or hematemesis Neurologic: negative for visual changes, syncope, or dizziness All other systems reviewed and are otherwise negative except as noted above.    Blood pressure 138/80, pulse 82, height 5\' 7"  (1.702 m), weight 207 lb (93.9 kg), SpO2 95%.  General appearance: alert and no distress Neck: no adenopathy, no carotid bruit, no JVD, supple, symmetrical, trachea midline, and thyroid not enlarged, symmetric, no tenderness/mass/nodules Lungs: clear to auscultation bilaterally Heart: regular rate and rhythm, S1, S2 normal, no murmur, click, rub or gallop Extremities: extremities  normal, atraumatic, no cyanosis or edema Pulses: 2+ and symmetric Skin: Skin color, texture, turgor normal. No rashes or lesions Neurologic: Grossly normal  EKG EKG Interpretation Date/Time:  Monday November 01 2023 11:32:14 EST Ventricular Rate:  82 PR Interval:  162 QRS Duration:  94 QT Interval:  370 QTC Calculation: 432 R Axis:   6  Text Interpretation: Normal sinus rhythm Possible Left atrial enlargement Left ventricular hypertrophy ( Romhilt-Estes ) Cannot rule out Septal infarct , age undetermined ST & T wave abnormality, consider lateral ischemia When compared with ECG of 01-Sep-2022 07:06, No significant change was found Confirmed by Nanetta Batty [id number]) on 11/01/2023 12:07:23 PM    ASSESSMENT AND PLAN:   Claudication in peripheral vascular disease (HCC) Great history of PAD status post right common iliac artery PTA and covered stenting using a VBX 7 mm x 29 mm long covered stent for a subtotal we occluded right common iliac artery 12/29/2021.  She did have a totally occluded  right SFA from the origin down to the adductor canal with three-vessel runoff.  She also had a 95% left common femoral artery stenosis, 30% mid left SFA stenosis with two-vessel runoff.  She had an occluded anterior tibial.  Her symptoms of claudication significantly improved after intervention although now she does complain of some numbness in her left foot and some mild claudication on the right.  Her left lower extremity arterial Doppler study performed 06/29/2022 revealed patent right common iliac artery stent.     Runell Gess MD FACP,FACC,FAHA, [facility] 11/01/2023 12:07 PM

## 2023-11-01 NOTE — Assessment & Plan Note (Signed)
Great history of PAD status post right common iliac artery PTA and covered stenting using a VBX 7 mm x 29 mm long covered stent for a subtotal we occluded right common iliac artery 12/29/2021.  She did have a totally occluded right SFA from the origin down to the adductor canal with three-vessel runoff.  She also had a 95% left common femoral artery stenosis, 30% mid left SFA stenosis with two-vessel runoff.  She had an occluded anterior tibial.  Her symptoms of claudication significantly improved after intervention although now she does complain of some numbness in her left foot and some mild claudication on the right.  Her left lower extremity arterial Doppler study performed 06/29/2022 revealed patent right common iliac artery stent.

## 2023-11-01 NOTE — Patient Instructions (Signed)
Medication Instructions:  Your physician recommends that you continue on your current medications as directed. Please refer to the Current Medication list given to you today.  *If you need a refill on your cardiac medications before your next appointment, please call your pharmacy*   Testing/Procedures: Your physician has requested that you have an Aorta/Iliac Duplex. This will be take place at [address] [facility], [address].  No food after 11PM the night before.  Water is OK. (Don't drink liquids if you have been instructed not to for ANOTHER test) Avoid foods that produce bowel gas, for 24 hours prior to exam (see below). No breakfast, no chewing gum, no smoking or carbonated beverages. Patient may take morning medications with water. Come in for test at least 15 minutes early to register.  Please note: We ask at that you not bring children with you during ultrasound (echo/ vascular) testing. Due to room size and safety concerns, children are not allowed in the ultrasound rooms during exams. Our front office staff cannot provide observation of children in our lobby area while testing is being conducted. An adult accompanying a patient to their appointment will only be allowed in the ultrasound room at the discretion of the ultrasound technician under special circumstances. We apologize for any inconvenience.  Your physician has requested that you have a lower extremity arterial duplex. During this test, ultrasound is used to evaluate arterial blood flow in the legs. Allow one hour for this exam. There are no restrictions or special instructions. This will take place at [address] [facility], [address].  Please note: We ask at that you not bring children with you during ultrasound (echo/ vascular) testing. Due to room size and safety concerns, children are not allowed in the ultrasound rooms during exams. Our front office staff cannot provide observation of children in our lobby area while testing is  being conducted. An adult accompanying a patient to their appointment will only be allowed in the ultrasound room at the discretion of the ultrasound technician under special circumstances. We apologize for any inconvenience.  Your physician has requested that you have an ankle brachial index (ABI). During this test an ultrasound and blood pressure cuff are used to evaluate the arteries that supply the arms and legs with blood. Allow thirty minutes for this exam. There are no restrictions or special instructions. This will take place at [address] [facility], [address].    Please note: We ask at that you not bring children with you during ultrasound (echo/ vascular) testing. Due to room size and safety concerns, children are not allowed in the ultrasound rooms during exams. Our front office staff cannot provide observation of children in our lobby area while testing is being conducted. An adult accompanying a patient to their appointment will only be allowed in the ultrasound room at the discretion of the ultrasound technician under special circumstances. We apologize for any inconvenience.     Follow-Up: At [facility], you and your health needs are our priority.  As part of our continuing mission to provide you with exceptional heart care, we have created designated Provider Care Teams.  These Care Teams include your primary Cardiologist (physician) and Advanced Practice Providers (APPs -  Physician Assistants and Nurse Practitioners) who all work together to provide you with the care you need, when you need it.  We recommend signing up for the patient portal called "MyChart".  Sign up information is provided on this After Visit Summary.  MyChart is used  to connect with patients for Virtual Visits (Telemedicine).  Patients are able to view lab/test results, encounter notes, upcoming appointments, etc.  Non-urgent messages can be sent to your provider as well.   To learn more about what you can  do with MyChart, go to [email or web address].    Your next appointment:   12 month(s)  Provider:   Nanetta Batty, MD

## 2023-11-15 ENCOUNTER — Ambulatory Visit: Payer: PRIVATE HEALTH INSURANCE | Attending: Cardiology | Admitting: Cardiology

## 2023-12-06 ENCOUNTER — Ambulatory Visit (HOSPITAL_COMMUNITY)
Admission: RE | Admit: 2023-12-06 | Discharge: 2023-12-06 | Disposition: A | Discharge status: Home / Self Care | Payer: PRIVATE HEALTH INSURANCE | Source: Ambulatory Visit | Attending: Cardiovascular Disease | Admitting: Cardiovascular Disease

## 2023-12-06 ENCOUNTER — Ambulatory Visit (HOSPITAL_BASED_OUTPATIENT_CLINIC_OR_DEPARTMENT_OTHER)
Admission: RE | Admit: 2023-12-06 | Discharge: 2023-12-06 | Disposition: A | Discharge status: Home / Self Care | Payer: PRIVATE HEALTH INSURANCE | Source: Ambulatory Visit | Attending: Cardiovascular Disease | Admitting: Cardiovascular Disease

## 2023-12-06 DIAGNOSIS — Z95820 Peripheral vascular angioplasty status with implants and grafts: Secondary | ICD-10-CM | POA: Diagnosis not present

## 2023-12-06 DIAGNOSIS — I10 Essential (primary) hypertension: Secondary | ICD-10-CM | POA: Diagnosis present

## 2023-12-06 DIAGNOSIS — I739 Peripheral vascular disease, unspecified: Secondary | ICD-10-CM | POA: Insufficient documentation

## 2023-12-07 LAB — VAS US ABI WITH/WO TBI
Left ABI: 0.66
Right ABI: 0.73

## 2023-12-28 ENCOUNTER — Ambulatory Visit: Payer: PRIVATE HEALTH INSURANCE | Attending: Cardiovascular Disease | Admitting: Cardiovascular Disease

## 2023-12-28 VITALS — BP 122/70 | HR 66 | Ht 67.0 in | Wt 202.0 lb

## 2023-12-28 DIAGNOSIS — I739 Peripheral vascular disease, unspecified: Secondary | ICD-10-CM | POA: Diagnosis not present

## 2023-12-28 LAB — CBC WITH DIFFERENTIAL/PLATELET

## 2023-12-28 NOTE — Patient Instructions (Addendum)
 Medication Instructions:  Your physician recommends that you continue on your current medications as directed. Please refer to the Current Medication list given to you today.  *If you need a refill on your cardiac medications before your next appointment, please call your pharmacy*   Lab Work: Your physician recommends that you have labs drawn today: BMET & CBC  If you have labs (blood work) drawn today and your tests are completely normal, you will receive your results only by: MyChart Message (if you have MyChart) OR A paper copy in the mail If you have any lab test that is abnormal or we need to change your treatment, we will call you to review the results.   Testing/Procedures: Your physician has requested that you have an Aorta/Iliac Duplex. This will be take place at [address], [address].  No food after 11PM the night before.  Water  is OK. (Don't drink liquids if you have been instructed not to for ANOTHER test) Avoid foods that produce bowel gas, for 24 hours prior to exam (see below). No breakfast, no chewing gum, no smoking or carbonated beverages. Patient may take morning medications with water . Come in for test at least 15 minutes early to register. To do 1-2 weeks after procedure (1/9).  Please note: We ask at that you not bring children with you during ultrasound (echo/ vascular) testing. Due to room size and safety concerns, children are not allowed in the ultrasound rooms during exams. Our front office staff cannot provide observation of children in our lobby area while testing is being conducted. An adult accompanying a patient to their appointment will only be allowed in the ultrasound room at the discretion of the ultrasound technician under special circumstances. We apologize for any inconvenience.  Your physician has requested that you have a lower extremity arterial duplex. During this test, ultrasound is used to evaluate arterial blood flow in the legs. Allow  one hour for this exam. There are no restrictions or special instructions. This will take place at [address], [address]. To do 1-2 weeks after procedure (1/9)  Please note: We ask at that you not bring children with you during ultrasound (echo/ vascular) testing. Due to room size and safety concerns, children are not allowed in the ultrasound rooms during exams. Our front office staff cannot provide observation of children in our lobby area while testing is being conducted. An adult accompanying a patient to their appointment will only be allowed in the ultrasound room at the discretion of the ultrasound technician under special circumstances. We apologize for any inconvenience.  Your physician has requested that you have an ankle brachial index (ABI). During this test an ultrasound and blood pressure cuff are used to evaluate the arteries that supply the arms and legs with blood. Allow thirty minutes for this exam. There are no restrictions or special instructions. This will take place at [address], [address]. To do 1-2 weeks after procedure (1/9).   Please note: We ask at that you not bring children with you during ultrasound (echo/ vascular) testing. Due to room size and safety concerns, children are not allowed in the ultrasound rooms during exams. Our front office staff cannot provide observation of children in our lobby area while testing is being conducted. An adult accompanying a patient to their appointment will only be allowed in the ultrasound room at the discretion of the ultrasound technician under special circumstances. We apologize for any inconvenience.    Follow-Up: At [facility], you and your health needs are our priority.  As part of our continuing mission to provide you with exceptional heart care, we have created designated Provider Care Teams.  These Care Teams include your primary Cardiologist (physician) and Advanced Practice Providers (APPs -   Physician Assistants and Nurse Practitioners) who all work together to provide you with the care you need, when you need it.  We recommend signing up for the patient portal called MyChart.  Sign up information is provided on this After Visit Summary.  MyChart is used to connect with patients for Virtual Visits (Telemedicine).  Patients are able to view lab/test results, encounter notes, upcoming appointments, etc.  Non-urgent messages can be sent to your provider as well.   To learn more about what you can do with MyChart, go to [email or web address].    Your next appointment:   2-3 week(s) after procedure (1/9)  Provider:   Dorn Lesches, MD   Other Instructions       Cardiac/Peripheral Catheterization   You are scheduled for a Peripheral Angiogram on Thursday, January 9 with Dr. Dorn Lesches.  1. Please arrive at the [facility] (Main Entrance A) at [facility]: [address] [place], [place] [address] at 9:30 AM (This time is 2 hour(s) before your procedure to ensure your preparation).   Free valet parking service is available. You will check in at ADMITTING. The support person will be asked to wait in the waiting room.  It is OK to have someone drop you off and come back when you are ready to be discharged.        Special note: Every effort is made to have your procedure done on time. Please understand that emergencies sometimes delay scheduled procedures.  2. Diet: Do not eat solid foods after midnight.  You may have clear liquids until 5 AM the day of the procedure.  3. Labs: You will need to have blood drawn today (1/7).  4. Medication instructions in preparation for your procedure:    Stop taking, Jardiance  Tuesday, January 7,   On the morning of your procedure, take Aspirin  81 mg and Plavix /Clopidogrel  and any morning medicines NOT listed above.  You may use sips of water .  5. Plan to go home the same day, you will only stay overnight if medically  necessary. 6. You MUST have a responsible adult to drive you home. 7. An adult MUST be with you the first 24 hours after you arrive home. 8. Bring a current list of your medications, and the last time and date medication taken. 9. Bring ID and current insurance cards. 10.Please wear clothes that are easy to get on and off and wear slip-on shoes.  Thank you for allowing us  to care for you!   -- [facility]

## 2023-12-28 NOTE — Assessment & Plan Note (Signed)
 [name] returns today for follow-up of her PAD.  She had right common iliac artery PTA and covered stenting using a VBX 7 mm x 20 mm long covered stent of a subtotally occluded right common iliac artery 12/29/2021.  She did have a flush occluded right SFA reconstituting in the adductor canal.  She also had a 95% left common femoral artery stenosis, mild mid left SFA stenosis with two-vessel runoff.  She has had progressive left lower extremity claudication.  Dopplers have shown a decline in her left ABI from 0.76-0.66 with what appears to be an occluded left external iliac artery and common femoral artery.  I plans to perform diagnostic angiography to define her anatomy.  I suspect she will require surgical revascularization.

## 2023-12-28 NOTE — H&P (View-Only) (Signed)
 12/28/2023 Alan Riles   1976-10-22  [id number]  Primary Physician Billy Philippe SAUNDERS, NP (Inactive) Primary Cardiologist: Dorn JINNY Lesches MD [place], [place]  HPI:  [name] is a 48 y.o.   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  She was referred by Dr. Krasowski, her cardiologist, for evaluation of lifestyle limiting claudication/PAD.  I last saw her in the office 11/01/2023.  Risk factors include 30 to 50 pack years of tobacco abuse continuing to smoke 1 to 1-1/2 packs a day.  She has treated hypertension, hyperlipidemia and diabetes.  She has had a heart attack in the past but no stroke.  She had bypass surgery in 2017 and had cardiac catheterization performed by Dr. Jordan June 2021 with medical treatment recommended.  She gets occasional nitrate risks responsive chest pain.  She is complained of right greater than left lower extremity claudication for the last year.  She did have recent Dopplers that ruled out DVT but suggested SFA disease bilaterally.  I obtained lower extremity arterial Doppler studies on 12/16/2021 revealing right ABI of 0.45 and a left of 0.65.  She had a high-frequency signal at the origin of her right common iliac artery, occluded right SFA and high-grade distal left common femoral artery stenosis.  She has right greater than left lower extremity claudication which has been lifestyle limiting and progressive for over a year.  e wishes to proceed with endovascular therapy.  I performed peripheral angiography on her 12/29/2021 revealing a subtotally occluded right common iliac artery which I stented using a 7 mm x 39 mm long VBX covered stent.  She did have a totally occluded right SFA from the origin down to the adductor canal with three-vessel runoff as well as a 95% distal left common femoral artery stenosis with two-vessel runoff.  Her  claudication symptoms have markedly improved as has her left ABI from 0.45 up to 0.77.   Since I saw her 2 years ago she does continue to smoke a half a pack a day which is somewhat improved.  Her claudication has remained fairly stable.  She complains of mild claudication on the right as well is on the left.  Her last lower extremity arterial Doppler study performed 06/29/2021 revealed a patent right common iliac artery stent.  She apparently was hospitalized in October at [facility] with pleural effusion.  2D echo revealed normal LV systolic function and apparently a perfusion study showed an abnormality for which she is following up with Dr. Krasowski in the next couple weeks.  Because of recurrent left lower extremity claudication I did obtain Doppler studies 12/06/2023 revealing a decline in her left ABI from 0.76-0.66 with what appears to be an occluded left external iliac artery and common femoral artery.  This is currently lifestyle limiting.  She wishes to proceed with outpatient peripheral angiography to define her anatomy.  I suspect this will be surgical anatomy.   Current Meds  Medication Sig   aspirin  EC 81 MG tablet Take 1 tablet (81 mg total) by mouth daily. Swallow whole.   atorvastatin  (LIPITOR ) 80 MG tablet Take 1 tablet (80 mg total) by mouth daily. (Patient taking differently: Take 80 mg by mouth at bedtime.)   busPIRone  (BUSPAR ) 15 MG tablet Take 15 mg by mouth 2 (two) times daily.   carvedilol  (COREG ) 12.5 MG tablet Take 2 tablets (25 mg total) by  mouth 2 (two) times daily with a meal.   cilostazol  (PLETAL ) 50 MG tablet Take 1 tablet (50 mg total) by mouth 2 (two) times daily.   clopidogrel  (PLAVIX ) 75 MG tablet Take 1 tablet (75 mg total) by mouth daily with breakfast.   empagliflozin  (JARDIANCE ) 25 MG TABS tablet Take 25 mg by mouth daily.   escitalopram (LEXAPRO) 10 MG tablet Take 10 mg by mouth daily.   gabapentin  (NEURONTIN ) 300 MG capsule Take 600 mg by mouth 2  (two) times daily.   glipiZIDE  (GLUCOTROL ) 10 MG tablet Take 10 mg by mouth daily before breakfast.   isosorbide  mononitrate (IMDUR ) 120 MG 24 hr tablet Take 1 tablet (120 mg total) by mouth daily.   losartan  (COZAAR ) 25 MG tablet Take 1 tablet (25 mg total) by mouth daily.   metFORMIN  (GLUCOPHAGE -XR) 500 MG 24 hr tablet Take 2 tablets (1,000 mg total) by mouth 2 (two) times daily.   nitroGLYCERIN  (NITROSTAT ) 0.4 MG SL tablet DISSOLVE ONE TABLET UNDER THE TONGUE EVERY 5 MINUTES AS NEEDED (Patient taking differently: Place 0.4 mg under the tongue every 5 (five) minutes as needed for chest pain.)   ranolazine  (RANEXA ) 1000 MG SR tablet Take 1 tablet by mouth twice daily (Patient taking differently: Take 1,000 mg by mouth 2 (two) times daily.)   SV IRON 325 MG tablet Take 325 mg by mouth daily.     No Known Allergies  Social History   Socioeconomic History   Marital status: Single    Spouse name: Not on file   Number of children: Not on file   Years of education: Not on file   Highest education level: Not on file  Occupational History   Not on file  Tobacco Use   Smoking status: Every Day    Current packs/day: 0.50    Average packs/day: 0.5 packs/day for 21.0 years (10.5 ttl pk-yrs)    Types: Cigarettes   Smokeless tobacco: Never  Substance and Sexual Activity   Alcohol use: Yes    Comment: rare social   Drug use: No    Comment: x1 episode Meth use.   Sexual activity: Not Currently    Birth control/protection: None  Other Topics Concern   Not on file  Social History Narrative   Not on file   Social Drivers of Health   Financial Resource Strain: Not on file  Food Insecurity: Not on file  Transportation Needs: Not on file  Physical Activity: Not on file  Stress: Not on file  Social Connections: Not on file  Intimate Partner Violence: Not on file     Review of Systems: General: negative for chills, fever, night sweats or weight changes.  Cardiovascular: negative for  chest pain, dyspnea on exertion, edema, orthopnea, palpitations, paroxysmal nocturnal dyspnea or shortness of breath Dermatological: negative for rash Respiratory: negative for cough or wheezing Urologic: negative for hematuria Abdominal: negative for nausea, vomiting, diarrhea, bright red blood per rectum, melena, or hematemesis Neurologic: negative for visual changes, syncope, or dizziness All other systems reviewed and are otherwise negative except as noted above.    Blood pressure 122/70, pulse 66, height 5' 7 (1.702 m), weight 202 lb (91.6 kg), SpO2 95%.  General appearance: alert and no distress Neck: no adenopathy, no carotid bruit, no JVD, supple, symmetrical, trachea midline, and thyroid  not enlarged, symmetric, no tenderness/mass/nodules Lungs: clear to auscultation bilaterally Heart: regular rate and rhythm, S1, S2 normal, no murmur, click, rub or gallop Extremities: extremities normal, atraumatic, no cyanosis or  edema Pulses: Diminished pedal pulses bilaterally Skin: Skin color, texture, turgor normal. No rashes or lesions Neurologic: Grossly normal  EKG not performed today.      ASSESSMENT AND PLAN:   Claudication in peripheral vascular disease [facility]) Ms. Weigel returns today for follow-up of her PAD.  She had right common iliac artery PTA and covered stenting using a VBX 7 mm x 20 mm long covered stent of a subtotally occluded right common iliac artery 12/29/2021.  She did have a flush occluded right SFA reconstituting in the adductor canal.  She also had a 95% left common femoral artery stenosis, mild mid left SFA stenosis with two-vessel runoff.  She has had progressive left lower extremity claudication.  Dopplers have shown a decline in her left ABI from 0.76-0.66 with what appears to be an occluded left external iliac artery and common femoral artery.  I plans to perform diagnostic angiography to define her anatomy.  I suspect she will require surgical  revascularization.     Dorn DOROTHA Lesches MD FACP,FACC,FAHA, [facility] 12/28/2023 9:13 AM

## 2023-12-28 NOTE — Progress Notes (Signed)
 12/28/2023 Alan Riles   1976-10-22  [id number]  Primary Physician Billy Philippe SAUNDERS, NP (Inactive) Primary Cardiologist: Dorn JINNY Lesches MD [facility] [place] [place], [place]  HPI:  [name] is a 48 y.o.   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  She was referred by Dr. Krasowski, her cardiologist, for evaluation of lifestyle limiting claudication/PAD.  I last saw her in the office 11/01/2023.  Risk factors include 30 to 50 pack years of tobacco abuse continuing to smoke 1 to 1-1/2 packs a day.  She has treated hypertension, hyperlipidemia and diabetes.  She has had a heart attack in the past but no stroke.  She had bypass surgery in 2017 and had cardiac catheterization performed by Dr. Jordan June 2021 with medical treatment recommended.  She gets occasional nitrate risks responsive chest pain.  She is complained of right greater than left lower extremity claudication for the last year.  She did have recent Dopplers that ruled out DVT but suggested SFA disease bilaterally.  I obtained lower extremity arterial Doppler studies on 12/16/2021 revealing right ABI of 0.45 and a left of 0.65.  She had a high-frequency signal at the origin of her right common iliac artery, occluded right SFA and high-grade distal left common femoral artery stenosis.  She has right greater than left lower extremity claudication which has been lifestyle limiting and progressive for over a year.  e wishes to proceed with endovascular therapy.  I performed peripheral angiography on her 12/29/2021 revealing a subtotally occluded right common iliac artery which I stented using a 7 mm x 39 mm long VBX covered stent.  She did have a totally occluded right SFA from the origin down to the adductor canal with three-vessel runoff as well as a 95% distal left common femoral artery stenosis with two-vessel runoff.  Her  claudication symptoms have markedly improved as has her left ABI from 0.45 up to 0.77.   Since I saw her 2 years ago she does continue to smoke a half a pack a day which is somewhat improved.  Her claudication has remained fairly stable.  She complains of mild claudication on the right as well is on the left.  Her last lower extremity arterial Doppler study performed 06/29/2021 revealed a patent right common iliac artery stent.  She apparently was hospitalized in October at [facility] with pleural effusion.  2D echo revealed normal LV systolic function and apparently a perfusion study showed an abnormality for which she is following up with Dr. Krasowski in the next couple weeks.  Because of recurrent left lower extremity claudication I did obtain Doppler studies 12/06/2023 revealing a decline in her left ABI from 0.76-0.66 with what appears to be an occluded left external iliac artery and common femoral artery.  This is currently lifestyle limiting.  She wishes to proceed with outpatient peripheral angiography to define her anatomy.  I suspect this will be surgical anatomy.   Current Meds  Medication Sig   aspirin  EC 81 MG tablet Take 1 tablet (81 mg total) by mouth daily. Swallow whole.   atorvastatin  (LIPITOR ) 80 MG tablet Take 1 tablet (80 mg total) by mouth daily. (Patient taking differently: Take 80 mg by mouth at bedtime.)   busPIRone  (BUSPAR ) 15 MG tablet Take 15 mg by mouth 2 (two) times daily.   carvedilol  (COREG ) 12.5 MG tablet Take 2 tablets (25 mg total) by  mouth 2 (two) times daily with a meal.   cilostazol  (PLETAL ) 50 MG tablet Take 1 tablet (50 mg total) by mouth 2 (two) times daily.   clopidogrel  (PLAVIX ) 75 MG tablet Take 1 tablet (75 mg total) by mouth daily with breakfast.   empagliflozin  (JARDIANCE ) 25 MG TABS tablet Take 25 mg by mouth daily.   escitalopram (LEXAPRO) 10 MG tablet Take 10 mg by mouth daily.   gabapentin  (NEURONTIN ) 300 MG capsule Take 600 mg by mouth 2  (two) times daily.   glipiZIDE  (GLUCOTROL ) 10 MG tablet Take 10 mg by mouth daily before breakfast.   isosorbide  mononitrate (IMDUR ) 120 MG 24 hr tablet Take 1 tablet (120 mg total) by mouth daily.   losartan  (COZAAR ) 25 MG tablet Take 1 tablet (25 mg total) by mouth daily.   metFORMIN  (GLUCOPHAGE -XR) 500 MG 24 hr tablet Take 2 tablets (1,000 mg total) by mouth 2 (two) times daily.   nitroGLYCERIN  (NITROSTAT ) 0.4 MG SL tablet DISSOLVE ONE TABLET UNDER THE TONGUE EVERY 5 MINUTES AS NEEDED (Patient taking differently: Place 0.4 mg under the tongue every 5 (five) minutes as needed for chest pain.)   ranolazine  (RANEXA ) 1000 MG SR tablet Take 1 tablet by mouth twice daily (Patient taking differently: Take 1,000 mg by mouth 2 (two) times daily.)   SV IRON 325 MG tablet Take 325 mg by mouth daily.     No Known Allergies  Social History   Socioeconomic History   Marital status: Single    Spouse name: Not on file   Number of children: Not on file   Years of education: Not on file   Highest education level: Not on file  Occupational History   Not on file  Tobacco Use   Smoking status: Every Day    Current packs/day: 0.50    Average packs/day: 0.5 packs/day for 21.0 years (10.5 ttl pk-yrs)    Types: Cigarettes   Smokeless tobacco: Never  Substance and Sexual Activity   Alcohol use: Yes    Comment: rare social   Drug use: No    Comment: x1 episode Meth use.   Sexual activity: Not Currently    Birth control/protection: None  Other Topics Concern   Not on file  Social History Narrative   Not on file   Social Drivers of Health   Financial Resource Strain: Not on file  Food Insecurity: Not on file  Transportation Needs: Not on file  Physical Activity: Not on file  Stress: Not on file  Social Connections: Not on file  Intimate Partner Violence: Not on file     Review of Systems: General: negative for chills, fever, night sweats or weight changes.  Cardiovascular: negative for  chest pain, dyspnea on exertion, edema, orthopnea, palpitations, paroxysmal nocturnal dyspnea or shortness of breath Dermatological: negative for rash Respiratory: negative for cough or wheezing Urologic: negative for hematuria Abdominal: negative for nausea, vomiting, diarrhea, bright red blood per rectum, melena, or hematemesis Neurologic: negative for visual changes, syncope, or dizziness All other systems reviewed and are otherwise negative except as noted above.    Blood pressure 122/70, pulse 66, height 5' 7 (1.702 m), weight 202 lb (91.6 kg), SpO2 95%.  General appearance: alert and no distress Neck: no adenopathy, no carotid bruit, no JVD, supple, symmetrical, trachea midline, and thyroid  not enlarged, symmetric, no tenderness/mass/nodules Lungs: clear to auscultation bilaterally Heart: regular rate and rhythm, S1, S2 normal, no murmur, click, rub or gallop Extremities: extremities normal, atraumatic, no cyanosis or  edema Pulses: Diminished pedal pulses bilaterally Skin: Skin color, texture, turgor normal. No rashes or lesions Neurologic: Grossly normal  EKG not performed today.      ASSESSMENT AND PLAN:   Claudication in peripheral vascular disease [facility]) Ms. Weigel returns today for follow-up of her PAD.  She had right common iliac artery PTA and covered stenting using a VBX 7 mm x 20 mm long covered stent of a subtotally occluded right common iliac artery 12/29/2021.  She did have a flush occluded right SFA reconstituting in the adductor canal.  She also had a 95% left common femoral artery stenosis, mild mid left SFA stenosis with two-vessel runoff.  She has had progressive left lower extremity claudication.  Dopplers have shown a decline in her left ABI from 0.76-0.66 with what appears to be an occluded left external iliac artery and common femoral artery.  I plans to perform diagnostic angiography to define her anatomy.  I suspect she will require surgical  revascularization.     Dorn DOROTHA Lesches MD FACP,FACC,FAHA, [facility] 12/28/2023 9:13 AM

## 2023-12-29 ENCOUNTER — Telehealth: Payer: Self-pay | Admitting: *Deleted

## 2023-12-29 LAB — CBC WITH DIFFERENTIAL/PLATELET
Basos: 1 %
EOS (ABSOLUTE): 0.1 10*3/uL (ref 0.0–0.2)
Eos: 2 %
Hematocrit: 54.8 % — ABNORMAL HIGH (ref 34.0–46.6)
Hemoglobin: 17 g/dL — ABNORMAL HIGH (ref 11.1–15.9)
Immature Granulocytes: 0 %
Immature Granulocytes: 0 10*3/uL (ref 0.0–0.1)
Lymphs: 21 %
MCH: 25.7 pg — ABNORMAL LOW (ref 26.6–33.0)
MCHC: 31 g/dL — ABNORMAL LOW (ref 31.5–35.7)
MCV: 83 fL (ref 79–97)
Monocytes Absolute: 0.1 10*3/uL (ref 0.0–0.4)
Monocytes Absolute: 0.5 10*3/uL (ref 0.1–0.9)
Monocytes: 7 %
Neutrophils Absolute: 1.7 10*3/uL (ref 0.7–3.1)
Neutrophils Absolute: 5.6 10*3/uL (ref 1.4–7.0)
Neutrophils: 69 %
Platelets: 193 10*3/uL (ref 150–450)
RBC: 6.61 x10E6/uL — ABNORMAL HIGH (ref 3.77–5.28)
RDW: 14 % (ref 11.7–15.4)
WBC: 8 10*3/uL (ref 3.4–10.8)

## 2023-12-29 LAB — BASIC METABOLIC PANEL
BUN/Creatinine Ratio: 22 (ref 9–23)
BUN: 15 mg/dL (ref 6–24)
CO2: 23 mmol/L (ref 20–29)
Calcium: 9.2 mg/dL (ref 8.7–10.2)
Chloride: 96 mmol/L (ref 96–106)
Creatinine, Ser: 0.68 mg/dL (ref 0.57–1.00)
Glucose: 476 mg/dL — ABNORMAL HIGH (ref 70–99)
Potassium: 5.4 mmol/L — ABNORMAL HIGH (ref 3.5–5.2)
Sodium: 134 mmol/L (ref 134–144)
eGFR: 107 mL/min/{1.73_m2} (ref >59)

## 2023-12-29 NOTE — Telephone Encounter (Signed)
 Abdominal aortogram  scheduled at [facility] for:  Thursday December 30, 2023 11:30 AM Arrival time [facility] Main Entrance A at: 9:30 AM  Nothing to eat after midnight prior to procedure, clear liquids until 5 AM day of procedure.  Medication instructions: -Hold:  Metformin -day of procedure and 48 hours post procedure  Jardiance /Torsemide -AM of procedure -Other usual morning medications can be taken with sips of water  including aspirin  81 mg.  Plan to go home the same day, you will only stay overnight if medically necessary.  You must have responsible adult to drive you home.  Someone must be with you the first 24 hours after you arrive home.  Reviewed procedure instructions with patient.

## 2023-12-30 ENCOUNTER — Ambulatory Visit (HOSPITAL_COMMUNITY)
Admission: RE | Admit: 2023-12-30 | Discharge: 2023-12-30 | Disposition: A | Discharge status: Home / Self Care | Payer: PRIVATE HEALTH INSURANCE | Attending: Cardiovascular Disease | Admitting: Cardiovascular Disease

## 2023-12-30 ENCOUNTER — Other Ambulatory Visit: Payer: Self-pay

## 2023-12-30 ENCOUNTER — Encounter (HOSPITAL_COMMUNITY)
Admission: RE | Disposition: A | Discharge status: Home / Self Care | Payer: Self-pay | Source: Home / Self Care | Attending: Cardiovascular Disease

## 2023-12-30 DIAGNOSIS — I252 Old myocardial infarction: Secondary | ICD-10-CM | POA: Insufficient documentation

## 2023-12-30 DIAGNOSIS — F17210 Nicotine dependence, cigarettes, uncomplicated: Secondary | ICD-10-CM | POA: Diagnosis not present

## 2023-12-30 DIAGNOSIS — Z7984 Long term (current) use of oral hypoglycemic drugs: Secondary | ICD-10-CM | POA: Insufficient documentation

## 2023-12-30 DIAGNOSIS — Z951 Presence of aortocoronary bypass graft: Secondary | ICD-10-CM | POA: Diagnosis not present

## 2023-12-30 DIAGNOSIS — I10 Essential (primary) hypertension: Secondary | ICD-10-CM | POA: Diagnosis not present

## 2023-12-30 DIAGNOSIS — I7092 Chronic total occlusion of artery of the extremities: Secondary | ICD-10-CM | POA: Insufficient documentation

## 2023-12-30 DIAGNOSIS — I739 Peripheral vascular disease, unspecified: Secondary | ICD-10-CM

## 2023-12-30 DIAGNOSIS — E785 Hyperlipidemia, unspecified: Secondary | ICD-10-CM | POA: Insufficient documentation

## 2023-12-30 DIAGNOSIS — E1151 Type 2 diabetes mellitus with diabetic peripheral angiopathy without gangrene: Secondary | ICD-10-CM | POA: Diagnosis present

## 2023-12-30 DIAGNOSIS — Z95820 Peripheral vascular angioplasty status with implants and grafts: Secondary | ICD-10-CM | POA: Insufficient documentation

## 2023-12-30 DIAGNOSIS — I70213 Atherosclerosis of native arteries of extremities with intermittent claudication, bilateral legs: Secondary | ICD-10-CM | POA: Insufficient documentation

## 2023-12-30 DIAGNOSIS — E1165 Type 2 diabetes mellitus with hyperglycemia: Secondary | ICD-10-CM

## 2023-12-30 HISTORY — PX: ABDOMINAL AORTOGRAM W/LOWER EXTREMITY: CATH118223

## 2023-12-30 LAB — GLUCOSE, CAPILLARY
Glucose-Capillary: 410 mg/dL — ABNORMAL HIGH (ref 70–99)
Glucose-Capillary: 304 mg/dL — ABNORMAL HIGH (ref 70–99)
Glucose-Capillary: 256 mg/dL — ABNORMAL HIGH (ref 70–99)

## 2023-12-30 LAB — PREGNANCY, URINE: Preg Test, Ur: NEGATIVE

## 2023-12-30 SURGERY — ABDOMINAL AORTOGRAM W/LOWER EXTREMITY
Anesthesia: LOCAL

## 2023-12-30 MED ORDER — HYDRALAZINE HCL 20 MG/ML IJ SOLN: 5.0000 mg | INTRAMUSCULAR | Status: DC | PRN

## 2023-12-30 MED ORDER — ALBUTEROL SULFATE HFA 108 (90 BASE) MCG/ACT IN AERS: 2.0000 | INHALATION_SPRAY | Freq: Four times a day (QID) | RESPIRATORY_TRACT | Status: DC | PRN

## 2023-12-30 MED ORDER — HEPARIN (PORCINE) IN NACL 1000-0.9 UT/500ML-% IV SOLN
INTRAVENOUS | Status: DC | PRN
  Administered 2023-12-30: 1000 mL

## 2023-12-30 MED ORDER — LOSARTAN POTASSIUM 25 MG PO TABS: 25.0000 mg | ORAL_TABLET | Freq: Every day | ORAL | Status: DC

## 2023-12-30 MED ORDER — SODIUM CHLORIDE 0.9 % WEIGHT BASED INFUSION: 1.0000 mL/kg/h | INTRAVENOUS | Status: DC

## 2023-12-30 MED ORDER — NITROGLYCERIN 0.4 MG SL SUBL: 0.4000 mg | SUBLINGUAL_TABLET | SUBLINGUAL | Status: DC | PRN

## 2023-12-30 MED ORDER — IODIXANOL 320 MG/ML IV SOLN
INTRAVENOUS | Status: DC | PRN
  Administered 2023-12-30: 90 mL

## 2023-12-30 MED ORDER — FENTANYL CITRATE (PF) 100 MCG/2ML IJ SOLN
INTRAMUSCULAR | Status: DC | PRN
  Administered 2023-12-30 (×2): 25 ug via INTRAVENOUS

## 2023-12-30 MED ORDER — SODIUM CHLORIDE 0.9 % IV SOLN: INTRAVENOUS | Status: AC

## 2023-12-30 MED ORDER — LIDOCAINE HCL (PF) 1 % IJ SOLN
INTRAMUSCULAR | Status: AC
  Filled 2023-12-30: qty 30

## 2023-12-30 MED ORDER — ACETAMINOPHEN 325 MG PO TABS: 650.0000 mg | ORAL_TABLET | ORAL | Status: DC | PRN

## 2023-12-30 MED ORDER — LIDOCAINE HCL (PF) 1 % IJ SOLN
INTRAMUSCULAR | Status: DC | PRN
  Administered 2023-12-30: 5 mL

## 2023-12-30 MED ORDER — INSULIN ASPART 100 UNIT/ML IJ SOLN
15.0000 [IU] | Freq: Once | INTRAMUSCULAR | Status: AC
  Administered 2023-12-30: 15 [IU] via SUBCUTANEOUS
  Filled 2023-12-30: qty 1

## 2023-12-30 MED ORDER — MIDAZOLAM HCL 2 MG/2ML IJ SOLN
INTRAMUSCULAR | Status: DC | PRN
  Administered 2023-12-30 (×2): 1 mg via INTRAVENOUS

## 2023-12-30 MED ORDER — ASPIRIN 81 MG PO TBEC: 81.0000 mg | DELAYED_RELEASE_TABLET | Freq: Every day | ORAL | Status: DC

## 2023-12-30 MED ORDER — SODIUM CHLORIDE 0.9 % IV SOLN: 250.0000 mL | INTRAVENOUS | Status: DC | PRN

## 2023-12-30 MED ORDER — SODIUM CHLORIDE 0.9 % WEIGHT BASED INFUSION: 3.0000 mL/kg/h | INTRAVENOUS | Status: DC

## 2023-12-30 MED ORDER — CARVEDILOL 25 MG PO TABS: 25.0000 mg | ORAL_TABLET | Freq: Two times a day (BID) | ORAL | Status: DC

## 2023-12-30 MED ORDER — TORSEMIDE 20 MG PO TABS: 20.0000 mg | ORAL_TABLET | Freq: Every day | ORAL | Status: DC

## 2023-12-30 MED ORDER — FENTANYL CITRATE (PF) 100 MCG/2ML IJ SOLN
INTRAMUSCULAR | Status: AC
  Filled 2023-12-30: qty 2

## 2023-12-30 MED ORDER — SODIUM CHLORIDE 0.9% FLUSH: 3.0000 mL | Freq: Two times a day (BID) | INTRAVENOUS | Status: DC

## 2023-12-30 MED ORDER — LABETALOL HCL 5 MG/ML IV SOLN: 10.0000 mg | INTRAVENOUS | Status: DC | PRN

## 2023-12-30 MED ORDER — MIDAZOLAM HCL 2 MG/2ML IJ SOLN
INTRAMUSCULAR | Status: AC
  Filled 2023-12-30: qty 2

## 2023-12-30 MED ORDER — SODIUM CHLORIDE 0.9% FLUSH: 3.0000 mL | INTRAVENOUS | Status: DC | PRN

## 2023-12-30 MED ORDER — MORPHINE SULFATE (PF) 2 MG/ML IV SOLN: 2.0000 mg | INTRAVENOUS | Status: DC | PRN

## 2023-12-30 MED ORDER — ASPIRIN 81 MG PO CHEW: 81.0000 mg | CHEWABLE_TABLET | ORAL | Status: DC

## 2023-12-30 MED ORDER — EMPAGLIFLOZIN 25 MG PO TABS: 25.0000 mg | ORAL_TABLET | Freq: Every day | ORAL | Status: DC

## 2023-12-30 MED ORDER — ONDANSETRON HCL 4 MG/2ML IJ SOLN: 4.0000 mg | Freq: Four times a day (QID) | INTRAMUSCULAR | Status: DC | PRN

## 2023-12-30 SURGICAL SUPPLY — 6 items
CATH ANGIO 5F PIGTAIL 65CM (CATHETERS) IMPLANT
SET ATX-X65L (MISCELLANEOUS) IMPLANT
SHEATH PINNACLE 5F 10CM (SHEATH) IMPLANT
SHEATH PROBE COVER 6X72 (BAG) IMPLANT
TRAY PV CATH (CUSTOM PROCEDURE TRAY) ×1 IMPLANT
WIRE HITORQ VERSACORE ST 145CM (WIRE) IMPLANT

## 2023-12-30 NOTE — Interval H&P Note (Signed)
 History and Physical Interval Note:  12/30/2023 11:25 AM  [name]  has presented today for surgery, with the diagnosis of pad.  The various methods of treatment have been discussed with the patient and family. After consideration of risks, benefits and other options for treatment, the patient has consented to  Procedure(s): ABDOMINAL AORTOGRAM W/LOWER EXTREMITY (N/A) as a surgical intervention.  The patient's history has been reviewed, patient examined, no change in status, stable for surgery.  I have reviewed the patient's chart and labs.  Questions were answered to the patient's satisfaction.     Dorn Lesches

## 2023-12-30 NOTE — Progress Notes (Signed)
 Site area: right groin  5 fr sheath  Site Prior to Removal:  Level 0 Pressure Applied For: 25 mins  Manual:    Patient Status During Pull:  tolerated well  Post Pull Site:  Level 0 Post Pull Instructions Given:  yes Post Pull Pulses Present: doppler  Dressing Applied:  yes Bedrest begins @ 1305 Comments:

## 2023-12-31 ENCOUNTER — Encounter (HOSPITAL_COMMUNITY): Payer: Self-pay | Admitting: Cardiovascular Disease

## 2023-12-31 ENCOUNTER — Telehealth: Payer: Self-pay

## 2023-12-31 NOTE — Telephone Encounter (Signed)
 Attempted to call pt for sooner appointment with Dr. Allyson Sabal post procedure. No voicemail. Will send a mychart message to pt.

## 2024-01-03 ENCOUNTER — Encounter: Payer: Self-pay | Admitting: Cardiovascular Disease

## 2024-01-03 ENCOUNTER — Ambulatory Visit: Payer: PRIVATE HEALTH INSURANCE | Attending: Cardiovascular Disease | Admitting: Cardiovascular Disease

## 2024-01-03 VITALS — BP 120/76 | HR 77 | Ht 67.0 in | Wt 198.6 lb

## 2024-01-03 DIAGNOSIS — I739 Peripheral vascular disease, unspecified: Secondary | ICD-10-CM

## 2024-01-03 NOTE — Progress Notes (Signed)
 [name] returns today after her recent peripheral vascular angiogram performed 12/30/2023.  This revealed a patent right common iliac artery stent, occluded right SFA which was old and occluded distal left extrailiac artery and common femoral artery.  She does have a patent left SFA with three-vessel runoff.  I believe the best treatment for her given her symptoms would be left extrailiac and common femoral endarterectomy patch angioplasty.  She has point to stay Dr. Lanis February 6.  She does have a nonischemic Myoview  10/02/2023 with an EF of 50 to 55% by 2D echo 10/01/2023.  I will see her back in 3 months.  Dorn DOROTHA Lesches, M.D., FACP, [facility], [facility] [address] [place], [place]  [address]  [phone] 01/03/2024 3:48 PM

## 2024-01-03 NOTE — Patient Instructions (Signed)
 Medication Instructions:  Your physician recommends that you continue on your current medications as directed. Please refer to the Current Medication list given to you today.  *If you need a refill on your cardiac medications before your next appointment, please call your pharmacy*   Follow-Up: At [facility], you and your health needs are our priority.  As part of our continuing mission to provide you with exceptional heart care, we have created designated Provider Care Teams.  These Care Teams include your primary Cardiologist (physician) and Advanced Practice Providers (APPs -  Physician Assistants and Nurse Practitioners) who all work together to provide you with the care you need, when you need it.  We recommend signing up for the patient portal called "MyChart".  Sign up information is provided on this After Visit Summary.  MyChart is used to connect with patients for Virtual Visits (Telemedicine).  Patients are able to view lab/test results, encounter notes, upcoming appointments, etc.  Non-urgent messages can be sent to your provider as well.   To learn more about what you can do with MyChart, go to [email or web address].    Your next appointment:   3 month(s)  Provider:   Nanetta Batty, MD     Other Instructions

## 2024-01-05 DIAGNOSIS — I340 Nonrheumatic mitral (valve) insufficiency: Secondary | ICD-10-CM | POA: Diagnosis not present

## 2024-01-19 LAB — LAB REPORT - SCANNED: EGFR: 90.2

## 2024-01-24 ENCOUNTER — Encounter (HOSPITAL_COMMUNITY): Payer: PRIVATE HEALTH INSURANCE

## 2024-01-26 NOTE — Progress Notes (Signed)
 Office Note     CC: Occluded left common femoral artery with lifestyle liming claudication Requesting Provider:  Nestor Elston NOVAK, NP  HPI: [name] is a 48 y.o. (August 05, 1976) female presenting at the request of Dr. Court with occluded left common femoral artery with lifestyle and claudication.  On exam, [name] was doing well, accompanied by her daughter.  A native of [place], she continues to live there.  Medical history is extensive at the age of 79, and includes NSTEMI-CABG, type 2 diabetes, PAD-right iliac artery stenting - [facility].  She continues to work full-time, daily smoker roughly half pack per day.  [name] has appreciated left lower extremity lifestyle-limiting claudication for quite some time.  She denies rest pain, tissue loss.  The pain affects her work, causing her to be slow.  She needs breaks when walking around which is inconvenient as she is on her feet all day and held to an efficiency standard.  Since last seen by Dr. Wadie, [name] unfortunately had a stroke a few weeks ago with MRI at [facility] demonstrating an acute infarct in the left internal capsule.  Etiology currently unknown.  She has not seen neurology regarding her stroke. She has no residual deficits that she is aware of.  The pt is not on a statin for cholesterol management.  The pt is  on a daily aspirin .   Other AC:  - The pt is  on medication for hypertension.   The pt is  diabetic.  Tobacco hx:  current  Past Medical History:  Diagnosis Date   Abnormal stress test 08/20/2016   Formatting of this note might be different from the original. Added automatically from request for surgery [id number]   Acute appendicitis 01/28/2016   Acute appendicitis with localized peritonitis 01/28/2016   Acute non-ST segment elevation myocardial infarction (HCC) 03/17/2021   Anemia    Angina pectoris (HCC) 06/27/2020   Anxiety disorder 03/17/2021   Calculus of gallbladder 06/05/2016   Cardiomyopathy (HCC)     postpartem cardiomyopathy- 2012- has since been released by cardiology   Coronary artery disease 06/27/2020   COVID 10/14/2020   c/o cough and chest pressure    Depression    Dyslipidemia 06/27/2020   Dyspnea on exertion 06/27/2020   History of coronary artery bypass graft 09/22/2016   Formatting of this note might be different from the original. LIMA to LAD, SVG to PDA in September 2017 at [facility]   Hyperglycemia due to type 2 diabetes mellitus (HCC) 02/25/2016   Hypertension    Liver abscess    2012- Leonce Birk drained placed for awhile.   Long-term insulin  use (HCC) 01/26/2017   Mixed hyperlipidemia 02/25/2016   NSTEMI (non-ST elevated myocardial infarction) (HCC) 06/07/2020   Obesity 09/18/2011   PCOS (polycystic ovarian syndrome) 02/25/2016   Persistent microalbuminuria associated with type 2 diabetes mellitus (HCC) 02/25/2016   Renal failure 09/25/2011   Due to vancomycin , IV contrast, sepsis.     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  Was hospitalized, intubated, had cardiopulmonary failure.    Tobacco abuse    Type 2 diabetes mellitus with hyperglycemia (HCC) 02/25/2016    Past Surgical History:  Procedure Laterality Date   ABDOMINAL AORTOGRAM W/LOWER EXTREMITY N/A 12/29/2021   Procedure:  ABDOMINAL AORTOGRAM W/LOWER EXTREMITY;  Surgeon: Court Dorn PARAS, MD;  Location: [facility] INVASIVE CV LAB;  Service: Cardiovascular;  Laterality: N/A;   ABDOMINAL AORTOGRAM W/LOWER EXTREMITY N/A 12/30/2023   Procedure: ABDOMINAL AORTOGRAM W/LOWER EXTREMITY;  Surgeon: Court Dorn PARAS, MD;  Location: [facility] INVASIVE CV LAB;  Service: Cardiovascular;  Laterality: N/A;   APPENDECTOMY     CORONARY ARTERY BYPASS GRAFT  2017   [facility]   CRYOTHERAPY     90's- cervix   DILATION AND CURETTAGE OF UTERUS  1994   LAPAROSCOPIC APPENDECTOMY N/A 01/28/2016   Procedure: APPENDECTOMY LAPAROSCOPIC;  Surgeon: Jina Nephew, MD;  Location: [facility] OR;  Service: General;  Laterality: N/A;   LEFT HEART CATH AND CORS/GRAFTS ANGIOGRAPHY N/A 06/07/2020   Procedure: LEFT HEART CATH AND CORS/GRAFTS ANGIOGRAPHY;  Surgeon: Jordan, Peter M, MD;  Location: [facility] INVASIVE CV LAB;  Service: Cardiovascular;  Laterality: N/A;   PERIPHERAL VASCULAR INTERVENTION  12/29/2021   Procedure: PERIPHERAL VASCULAR INTERVENTION;  Surgeon: Court Dorn PARAS, MD;  Location: [facility] INVASIVE CV LAB;  Service: Cardiovascular;;  Rt. Iliac   ROBOTIC ASSISTED SALPINGO OOPHERECTOMY Right 03/24/2016   Procedure: XI ROBOTIC ASSISTED LAPAROSCOPIC RIGHT SALPINGO OOPHORECTOMY;  Surgeon: Maurilio Ship, MD;  Location: [facility] ORS;  Service: Gynecology;  Laterality: Right;   TONSILLECTOMY      Social History   Socioeconomic History   Marital status: Single    Spouse name: Not on file   Number of children: Not on file   Years of education: Not on file   Highest education level: Not on file  Occupational History   Not on file  Tobacco Use   Smoking status: Every Day    Current packs/day: 0.50    Average packs/day: 0.5 packs/day for 21.0 years (10.5 ttl pk-yrs)    Types: Cigarettes   Smokeless tobacco: Never  Substance and Sexual Activity   Alcohol use: Yes    Comment: rare social   Drug use: No    Comment: x1 episode Meth use.   Sexual activity: Not Currently    Birth control/protection: None  Other Topics Concern   Not on file  Social History Narrative   Not on file   Social Drivers of Health   Financial Resource Strain: Not on file  Food Insecurity: Not on file  Transportation Needs: Not on file  Physical Activity: Not on file  Stress: Not on file  Social Connections: Not on file  Intimate Partner Violence: Not on file   Family History  Problem Relation Age of Onset    CAD Mother    CAD Father    Diabetes Maternal Grandmother    Diabetes Paternal Grandfather     Current Outpatient Medications  Medication Sig Dispense Refill   albuterol  (VENTOLIN  HFA) 108 (90 Base) MCG/ACT inhaler Inhale 2 puffs into the lungs every 6 (six) hours as needed (Asthma).     aspirin  EC 81 MG tablet Take 1 tablet (81 mg total) by mouth daily. Swallow whole. 90 tablet 3   carvedilol  (COREG ) 12.5 MG tablet Take 2 tablets (25 mg total) by mouth 2 (two) times daily with a meal. (Patient taking differently: Take 25 mg by mouth daily.) 180 tablet 1   empagliflozin  (JARDIANCE ) 25 MG TABS tablet Take 25 mg by mouth daily.     losartan  (COZAAR ) 25 MG tablet Take 1 tablet (25 mg total) by mouth daily. (Patient taking differently: Take 50 mg by mouth daily.) 90 tablet 1   metFORMIN  (GLUCOPHAGE -XR) 500 MG  24 hr tablet Take 2 tablets (1,000 mg total) by mouth 2 (two) times daily.     nitroGLYCERIN  (NITROSTAT ) 0.4 MG SL tablet DISSOLVE ONE TABLET UNDER THE TONGUE EVERY 5 MINUTES AS NEEDED (Patient taking differently: Place 0.4 mg under the tongue every 5 (five) minutes as needed for chest pain.) 25 tablet 0   torsemide  (DEMADEX ) 20 MG tablet Take 20 mg by mouth daily.     No current facility-administered medications for this visit.    No Known Allergies   REVIEW OF SYSTEMS:  [X]  denotes positive finding, [ ]  denotes negative finding Cardiac  Comments:  Chest pain or chest pressure:    Shortness of breath upon exertion:    Short of breath when lying flat:    Irregular heart rhythm:        Vascular    Pain in calf, thigh, or hip brought on by ambulation:    Pain in feet at night that wakes you up from your sleep:     Blood clot in your veins:    Leg swelling:         Pulmonary    Oxygen at home:    Productive cough:     Wheezing:         Neurologic    Sudden weakness in arms or legs:     Sudden numbness in arms or legs:     Sudden onset of difficulty speaking or slurred  speech:    Temporary loss of vision in one eye:     Problems with dizziness:         Gastrointestinal    Blood in stool:     Vomited blood:         Genitourinary    Burning when urinating:     Blood in urine:        Psychiatric    Major depression:         Hematologic    Bleeding problems:    Problems with blood clotting too easily:        Skin    Rashes or ulcers:        Constitutional    Fever or chills:      PHYSICAL EXAMINATION:  There were no vitals filed for this visit.  General:  WDWN in NAD; vital signs documented above, obese Gait: Not observed HENT: WNL, normocephalic Pulmonary: normal non-labored breathing , without wheezing Cardiac: regular HR Abdomen: soft, NT, no masses Skin: without rashes Vascular Exam/Pulses:  Right Left  Radial 2+ (normal) 2+ (normal)  Ulnar    Femoral  absent  Popliteal    DP nonpalpable nonpalpable  PT     Extremities: without ischemic changes, without Gangrene , without cellulitis; without open wounds;  Musculoskeletal: no muscle wasting or atrophy  Neurologic: A&O X 3;  No focal weakness or paresthesias are detected Psychiatric:  The pt has Normal affect.   Non-Invasive Vascular Imaging:    ABI Findings:  +---------+------------------+-----+----------+--------+  Right   Rt Pressure (mmHg)IndexWaveform  Comment   +---------+------------------+-----+----------+--------+  Brachial 166                                        +---------+------------------+-----+----------+--------+  PTA     121               0.73 monophasic          +---------+------------------+-----+----------+--------+  DP  115               0.69 monophasic          +---------+------------------+-----+----------+--------+  Great Toe94                0.57 Abnormal            +---------+------------------+-----+----------+--------+   +---------+------------------+-----+----------+-------+  Left    Lt Pressure  (mmHg)IndexWaveform  Comment  +---------+------------------+-----+----------+-------+  Brachial 159                                       +---------+------------------+-----+----------+-------+  PTA     104               0.63 monophasic         +---------+------------------+-----+----------+-------+  DP      109               0.66 monophasic         +---------+------------------+-----+----------+-------+  Great Toe91                0.55 Abnormal           +---------+------------------+-----+----------+-------+   +-------+-----------+-----------+------------+------------+  ABI/TBIToday's ABIToday's TBIPrevious ABIPrevious TBI  +-------+-----------+-----------+------------+------------+  Right .73        .57        .69         .45           +-------+-----------+-----------+------------+------------+  Left  .66        .55        .76         .40           +-------+-----------+-----------+------------+------------+     ASSESSMENT/PLAN: Carrin Vannostrand is a 48 y.o. female presenting with left lower extremity lifestyle-limiting claudication.  I find it interesting that her right leg does not cause more symptoms, especially as her ABIs are relatively symmetric.  Regardless, imaging demonstrates left-sided common femoral artery occlusion with reconstitution of the profunda and superficial femoral artery distally  I think [name] would have significant improvement with left iliofemoral endarterectomy with patch plasty.  With her medical comorbidities including diabetes, as well as her obesity, she is at high risk for wound breakdown and infection.  I was very clear about this during our discussion.  After discussing the risks and benefits, Adrean elected to proceed with surgery.    Being that she recently had a stroke of unknown etiology, I asked her to see neurology prior to moving forward with an elective procedure.  Dr. Court has cleared her from a  cardiac standpoint.   My plan is to call in 1 month's time after she has been seen by neurology.  Barring no issues, we will work to schedule her surgery after that.    Fonda FORBES Rim, MD [facility] [phone] Total time of patient care including pre-visit research, consultation, and documentation greater than 60 minutes

## 2024-01-27 ENCOUNTER — Encounter: Payer: Self-pay | Admitting: Vascular Surgery

## 2024-01-27 ENCOUNTER — Ambulatory Visit (INDEPENDENT_AMBULATORY_CARE_PROVIDER_SITE_OTHER): Payer: Self-pay | Admitting: Vascular Surgery

## 2024-01-27 VITALS — BP 178/103 | HR 90 | Temp 97.7°F | Resp 20 | Ht 67.0 in | Wt 204.0 lb

## 2024-01-27 DIAGNOSIS — I739 Peripheral vascular disease, unspecified: Secondary | ICD-10-CM

## 2024-02-09 ENCOUNTER — Ambulatory Visit: Payer: PRIVATE HEALTH INSURANCE | Attending: Cardiology | Admitting: Cardiology

## 2024-02-09 ENCOUNTER — Encounter: Payer: Self-pay | Admitting: Cardiology

## 2024-02-09 ENCOUNTER — Ambulatory Visit: Payer: PRIVATE HEALTH INSURANCE | Admitting: Cardiovascular Disease

## 2024-02-09 VITALS — BP 160/90 | HR 94 | Ht 67.0 in | Wt 202.0 lb

## 2024-02-09 DIAGNOSIS — I25708 Atherosclerosis of coronary artery bypass graft(s), unspecified, with other forms of angina pectoris: Secondary | ICD-10-CM

## 2024-02-09 DIAGNOSIS — E1165 Type 2 diabetes mellitus with hyperglycemia: Secondary | ICD-10-CM | POA: Diagnosis not present

## 2024-02-09 DIAGNOSIS — I639 Cerebral infarction, unspecified: Secondary | ICD-10-CM

## 2024-02-09 DIAGNOSIS — Z720 Tobacco use: Secondary | ICD-10-CM

## 2024-02-09 DIAGNOSIS — E785 Hyperlipidemia, unspecified: Secondary | ICD-10-CM

## 2024-02-09 DIAGNOSIS — Z794 Long term (current) use of insulin: Secondary | ICD-10-CM

## 2024-02-09 DIAGNOSIS — I10 Essential (primary) hypertension: Secondary | ICD-10-CM

## 2024-02-09 DIAGNOSIS — I739 Peripheral vascular disease, unspecified: Secondary | ICD-10-CM | POA: Diagnosis not present

## 2024-02-09 DIAGNOSIS — I255 Ischemic cardiomyopathy: Secondary | ICD-10-CM

## 2024-02-09 MED ORDER — LOSARTAN POTASSIUM 50 MG PO TABS: 50.0000 mg | ORAL_TABLET | Freq: Every day | ORAL | 3 refills | Status: DC

## 2024-02-09 MED ORDER — ATORVASTATIN CALCIUM 80 MG PO TABS: 80.0000 mg | ORAL_TABLET | Freq: Every day | ORAL | 3 refills | Status: DC

## 2024-02-09 NOTE — Patient Instructions (Addendum)
 Medication Instructions:   INCREASE: Losartan to 50mg  daily  START: Lipitor 80mg  1 tablet daily   Lab Work:  Your physician recommends that you return for lab work in: 1 week You need to have labs done when you are fasting.  You can come Monday through Friday 8:30 am to 12:00 pm and 1:15 to 4:30. You do not need to make an appointment as the order has already been placed. The labs you are going to have done are BMET.    Your physician recommends that you return for lab work in: 6 weeks You need to have labs done when you are fasting.  You can come Monday through Friday 8:30 am to 12:00 pm and 1:15 to 4:30. You do not need to make an appointment as the order has already been placed. The labs you are going to have done are AST, ALT Lipids.    Testing/Procedures: None Ordered   Follow-Up: At [facility], you and your health needs are our priority.  As part of our continuing mission to provide you with exceptional heart care, we have created designated Provider Care Teams.  These Care Teams include your primary Cardiologist (physician) and Advanced Practice Providers (APPs -  Physician Assistants and Nurse Practitioners) who all work together to provide you with the care you need, when you need it.  We recommend signing up for the patient portal called "MyChart".  Sign up information is provided on this After Visit Summary.  MyChart is used to connect with patients for Virtual Visits (Telemedicine).  Patients are able to view lab/test results, encounter notes, upcoming appointments, etc.  Non-urgent messages can be sent to your provider as well.   To learn more about what you can do with MyChart, go to [email or web address].    Your next appointment:   4 month(s)  The format for your next appointment:   In Person  Provider:   Gypsy Balsam, MD    Other Instructions Referral to Neurologist- They will call for appt

## 2024-02-09 NOTE — Progress Notes (Unsigned)
 Cardiology Office Note:    Date:  02/09/2024   ID:  [name], DOB Feb 11, 1976, MRN [id number]  PCP:  Lars Mage, NP  Cardiologist:  Gypsy Balsam, MD    Referring MD: Lars Mage, NP   Chief Complaint  Patient presents with   Follow-up  I had a stroke  History of Present Illness:    [name] is a 48 y.o. female complex past medical history in 2017 she did have coronary artery bypass graft with LIMA to LAD SVG to RCA history.  Significant for essential hypertension type 2 diabetes smoking to still ongoing, in 2021 she had cardiac catheterization done which showed completely occluded LAD however LIMA to LAD was functional and patent, there was complete occlusion of the proximal and mid RCA SVG to RCA was completely occluded however RCA distal portion had pretty decent collateralization.  She also got 70% mid to distal obtuse marginal branch.  She does have severe peripheral vascular disease and actually surgery is contemplated for claudications.  She ended up being in a hospital in January of this year apparently diagnosis was stroke.  She was noted not to take her medication continue smoking and also poorly controlled diabetes.  She comes today to months for regular follow-up.  She is supposed to see neurologist however for some reason did not happen yet.  Will make another referral.  Cardiac wise she says she is doing fine denies have any chest pain tightness squeezing pressure burning chest does describe to have shortness of breath and claudication in the left lower extremity.  Past Medical History:  Diagnosis Date   Abnormal stress test 08/20/2016   Formatting of this note might be different from the original. Added automatically from request for surgery [id number]   Acute appendicitis 01/28/2016   Acute appendicitis with localized peritonitis 01/28/2016   Acute non-ST segment elevation myocardial infarction (HCC) 03/17/2021   Anemia    Angina pectoris (HCC) 06/27/2020    Anxiety disorder 03/17/2021   Calculus of gallbladder 06/05/2016   Cardiomyopathy (HCC)    "postpartem cardiomyopathy"- 2012- has since been released by cardiology   Coronary artery disease 06/27/2020   COVID 10/14/2020   c/o cough and chest pressure    Depression    Dyslipidemia 06/27/2020   Dyspnea on exertion 06/27/2020   History of coronary artery bypass graft 09/22/2016   Formatting of this note might be different from the original. LIMA to LAD, SVG to PDA in September 2017 at [facility]   Hyperglycemia due to type 2 diabetes mellitus (HCC) 02/25/2016   Hypertension    Liver abscess    2012- Al Pimple drained placed for awhile.   Long-term insulin use (HCC) 01/26/2017   Mixed hyperlipidemia 02/25/2016   NSTEMI (non-ST elevated myocardial infarction) (HCC) 06/07/2020   Obesity 09/18/2011   PCOS (polycystic ovarian syndrome) 02/25/2016   Persistent microalbuminuria associated with type 2 diabetes mellitus (HCC) 02/25/2016   Renal failure 09/25/2011   Due to vancomycin, IV contrast, sepsis.     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  Was hospitalized, intubated, had cardiopulmonary failure.    Tobacco abuse    Type 2 diabetes mellitus with hyperglycemia (HCC) 02/25/2016    Past Surgical History:  Procedure Laterality Date  ABDOMINAL AORTOGRAM W/LOWER EXTREMITY N/A 12/29/2021   Procedure: ABDOMINAL AORTOGRAM W/LOWER EXTREMITY;  Surgeon: Runell Gess, MD;  Location: [facility] INVASIVE CV LAB;  Service: Cardiovascular;  Laterality: N/A;   ABDOMINAL AORTOGRAM W/LOWER EXTREMITY N/A 12/30/2023   Procedure: ABDOMINAL AORTOGRAM W/LOWER EXTREMITY;  Surgeon: Runell Gess, MD;  Location: [facility] INVASIVE CV LAB;  Service: Cardiovascular;   Laterality: N/A;   APPENDECTOMY     CORONARY ARTERY BYPASS GRAFT  2017   [facility]   CRYOTHERAPY     90's- cervix   DILATION AND CURETTAGE OF UTERUS  1994   LAPAROSCOPIC APPENDECTOMY N/A 01/28/2016   Procedure: APPENDECTOMY LAPAROSCOPIC;  Surgeon: Almond Lint, MD;  Location: [facility] OR;  Service: General;  Laterality: N/A;   LEFT HEART CATH AND CORS/GRAFTS ANGIOGRAPHY N/A 06/07/2020   Procedure: LEFT HEART CATH AND CORS/GRAFTS ANGIOGRAPHY;  Surgeon: [place]az[place], Peter M, MD;  Location: [facility] INVASIVE CV LAB;  Service: Cardiovascular;  Laterality: N/A;   PERIPHERAL VASCULAR INTERVENTION  12/29/2021   Procedure: PERIPHERAL VASCULAR INTERVENTION;  Surgeon: Runell Gess, MD;  Location: [facility] INVASIVE CV LAB;  Service: Cardiovascular;;  Rt. Iliac   ROBOTIC ASSISTED SALPINGO OOPHERECTOMY Right 03/24/2016   Procedure: XI ROBOTIC ASSISTED LAPAROSCOPIC RIGHT SALPINGO OOPHORECTOMY;  Surgeon: Adolphus Birchwood, MD;  Location: [facility] ORS;  Service: Gynecology;  Laterality: Right;   TONSILLECTOMY      Current Medications: Current Meds  Medication Sig   albuterol (VENTOLIN HFA) 108 (90 Base) MCG/ACT inhaler Inhale 2 puffs into the lungs every 6 (six) hours as needed (Asthma).   aspirin EC 81 MG tablet Take 1 tablet (81 mg total) by mouth daily. Swallow whole.   carvedilol (COREG) 12.5 MG tablet Take 2 tablets (25 mg total) by mouth 2 (two) times daily with a meal.   clopidogrel (PLAVIX) 75 MG tablet Take 75 mg by mouth daily.   empagliflozin (JARDIANCE) 25 MG TABS tablet Take 25 mg by mouth daily.   gabapentin (NEURONTIN) 100 MG capsule Take 100 mg by mouth 3 (three) times daily.   losartan (COZAAR) 25 MG tablet Take 1 tablet (25 mg total) by mouth daily.   metFORMIN (GLUCOPHAGE-XR) 500 MG 24 hr tablet Take 2 tablets (1,000 mg total) by mouth 2 (two) times daily.   nitroGLYCERIN (NITROSTAT) 0.4 MG SL tablet DISSOLVE ONE TABLET UNDER THE TONGUE EVERY 5 MINUTES AS NEEDED (Patient taking differently: Place  0.4 mg under the tongue every 5 (five) minutes as needed for chest pain.)   NOVOLIN 70/30 KWIKPEN (70-30) 100 UNIT/ML KwikPen Inject into the skin.   torsemide (DEMADEX) 20 MG tablet Take 20 mg by mouth daily.     Allergies:   Patient has no known allergies.   Social History   Socioeconomic History   Marital status: Single    Spouse name: Not on file   Number of children: Not on file   Years of education: Not on file   Highest education level: Not on file  Occupational History   Not on file  Tobacco Use   Smoking status: Every Day    Current packs/day: 0.50    Average packs/day: 0.5 packs/day for 21.0 years (10.5 ttl pk-yrs)    Types: Cigarettes   Smokeless tobacco: Never  Substance and Sexual Activity   Alcohol use: Yes    Comment: rare social   Drug use: No    Comment: x1 episode "Meth" use.   Sexual activity: Not Currently    Birth control/protection: None  Other Topics Concern  Not on file  Social History Narrative   Not on file   Social Drivers of Health   Financial Resource Strain: Not on file  Food Insecurity: Not on file  Transportation Needs: Not on file  Physical Activity: Not on file  Stress: Not on file  Social Connections: Not on file     Family History: The patient's family history includes CAD in her father and mother; Diabetes in her maternal grandmother and paternal grandfather. ROS:   Please see the history of present illness.    All 14 point review of systems negative except as described per history of present illness  EKGs/Labs/Other Studies Reviewed:         Recent Labs: 12/28/2023: BUN 15; Creatinine, Ser 0.68; Hemoglobin 17.0; Platelets 193; Potassium 5.4; Sodium 134  Recent Lipid Panel    Component Value Date/Time   CHOL 99 (L) 12/02/2021 0926   TRIG 132 12/02/2021 0926   HDL 30 (L) 12/02/2021 0926   CHOLHDL 3.3 12/02/2021 0926   CHOLHDL 4.9 06/07/2020 1422   VLDL 23 06/07/2020 1422   LDLCALC 45 12/02/2021 0926    Physical  Exam:    VS:  BP (!) 160/90 (BP Location: Left Arm, Patient Position: Sitting, Cuff Size: Normal)   Pulse 94   Ht 5\' 7"  (1.702 m)   Wt 202 lb (91.6 kg)   SpO2 95%   BMI 31.64 kg/m     Wt Readings from Last 3 Encounters:  02/09/24 202 lb (91.6 kg)  01/27/24 204 lb (92.5 kg)  01/03/24 198 lb 9.6 oz (90.1 kg)     GEN:  Well nourished, well developed in no acute distress HEENT: Normal NECK: No JVD; No carotid bruits LYMPHATICS: No lymphadenopathy CARDIAC: RRR, no murmurs, no rubs, no gallops RESPIRATORY:  Clear to auscultation without rales, wheezing or rhonchi  ABDOMEN: Soft, non-tender, non-distended MUSCULOSKELETAL:  No edema; No deformity  SKIN: Warm and dry LOWER EXTREMITIES: no swelling NEUROLOGIC:  Alert and oriented x 3 PSYCHIATRIC:  Normal affect   ASSESSMENT:    1. Ischemic cardiomyopathy   2. Coronary artery disease of bypass graft of native heart with stable angina pectoris (HCC)   3. Type 2 diabetes mellitus with hyperglycemia, with long-term current use of insulin (HCC)   4. Claudication in peripheral vascular disease (HCC)   5. Dyslipidemia   6. Tobacco abuse    PLAN:    In order of problems listed above:  History of ischemic cardiomyopathy.  I will increase dose of losartan today from 25-50, echocardiogram reviewed from hospital showed ejection fraction 50 to 55%. Coronary disease.  Stress test done in the hospital showed no evidence of ischemia just old myocardial infarction. Type 2 diabetes still not well-controlled she understands the problem follow-up with primary care physician. Essential hypertension poorly controlled we will double the dose of losartan from 25-50 we will check Chem-7 next week. Tobacco abuse obviously still huge problem she understand is hopeful she will be able to quit. Dyslipidemia I do not see her on any cholesterol-lowering medication we will put Lipitor 80.  Continue with Plavix   Medication Adjustments/Labs and Tests  Ordered: Current medicines are reviewed at length with the patient today.  Concerns regarding medicines are outlined above.  No orders of the defined types were placed in this encounter.  Medication changes: No orders of the defined types were placed in this encounter.   Signed, Georgeanna Lea, MD, [facility] 02/09/2024 11:42 AM    [facility]

## 2024-04-10 ENCOUNTER — Ambulatory Visit: Payer: PRIVATE HEALTH INSURANCE | Attending: Cardiovascular Disease | Admitting: Cardiovascular Disease

## 2024-04-10 ENCOUNTER — Encounter: Payer: Self-pay | Admitting: Cardiovascular Disease

## 2024-04-10 VITALS — BP 120/76 | HR 82 | Ht 67.0 in | Wt 200.0 lb

## 2024-04-10 DIAGNOSIS — I739 Peripheral vascular disease, unspecified: Secondary | ICD-10-CM

## 2024-04-10 NOTE — Patient Instructions (Signed)
 Medication Instructions:  Your physician recommends that you continue on your current medications as directed. Please refer to the Current Medication list given to you today.  *If you need a refill on your cardiac medications before your next appointment, please call your pharmacy*  Follow-Up: At [facility], you and your health needs are our priority.  As part of our continuing mission to provide you with exceptional heart care, our providers are all part of one team.  This team includes your primary Cardiologist (physician) and Advanced Practice Providers or APPs (Physician Assistants and Nurse Practitioners) who all work together to provide you with the care you need, when you need it.  Your next appointment:   6 month(s)  Provider:   Nanetta Batty, MD     We recommend signing up for the patient portal called "MyChart".  Sign up information is provided on this After Visit Summary.  MyChart is used to connect with patients for Virtual Visits (Telemedicine).  Patients are able to view lab/test results, encounter notes, upcoming appointments, etc.  Non-urgent messages can be sent to your provider as well.   To learn more about what you can do with MyChart, go to [email or web address].   Other Instructions       1st Floor: - Lobby - Registration  - Pharmacy  - Lab - Cafe  2nd Floor: - PV Lab - Diagnostic Testing (echo, CT, nuclear med)  3rd Floor: - Vacant  4th Floor: - TCTS (cardiothoracic surgery) - AFib Clinic - Structural Heart Clinic - Vascular Surgery  - Vascular Ultrasound  5th Floor: - HeartCare Cardiology (general and EP) - Clinical Pharmacy for coumadin, hypertension, lipid, weight-loss medications, and med management appointments    Valet parking services will be available as well.

## 2024-04-10 NOTE — Assessment & Plan Note (Signed)
 History of PAD status post peripheral angiography which I performed 12/29/2021 revealing subtotal occluded right common iliac artery which I stented using a 7 mm x 39 mm long VBX covered stent.  She did have a totally occluded right SFA from the origin down to the adductor canal with three-vessel runoff as well as a 95% left common femoral artery stenosis with two-vessel runoff.  Claudication symptoms markedly improved.  Because of progressive left lower extremity claudication and decline in her left ABI from 0.76-0.66 I proceeded with outpatient peripheral angiography on her 12/30/2023 revealing a patent iliac stent on the right, and total occlusion of her left distal external iliac artery and common femoral artery.  She had three-vessel runoff.  I did not think she had percutaneous options and referred her to Dr. Rosalva Comber, vein and vascular specialist, who agreed that she would best be served with endarterectomy and patch angioplasty however in the interim she did develop a stroke and is scheduled to see a neurologist for neurologic clearance.

## 2024-04-10 NOTE — Progress Notes (Signed)
 04/10/2024 [name]   [birth year]  [id number]  Primary Physician Teofilo Fellers, NP Primary Cardiologist: Avanell Leigh MD Dillon Frames  HPI:  [name] is a 48 y.o.    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  She was referred by Dr. Krasowski, her cardiologist, for evaluation of lifestyle limiting claudication/PAD.  I last saw her in the office 12/28/2023.  Risk factors include 30 to 50 pack years of tobacco abuse continuing to smoke 1 to 1-1/2 packs a day.  She has treated hypertension, hyperlipidemia and diabetes.  She has had a heart attack in the past but no stroke.  She had bypass surgery in 2017 and had cardiac catheterization performed by Dr. Swaziland June 2021 with medical treatment recommended.  She gets occasional nitrate risks responsive chest pain.  She is complained of right greater than left lower extremity claudication for the last year.  She did have recent Dopplers that ruled out DVT but suggested SFA disease bilaterally.  I obtained lower extremity arterial Doppler studies on 12/16/2021 revealing right ABI of 0.45 and a left of 0.65.  She had a high-frequency signal at the origin of her right common iliac artery, occluded right SFA and high-grade distal left common femoral artery stenosis.  She has right greater than left lower extremity claudication which has been lifestyle limiting and progressive for over a year.  e wishes to proceed with endovascular therapy.  I performed peripheral angiography on her 12/29/2021 revealing a subtotally occluded right common iliac artery which I stented using a 7 mm x 39 mm long VBX covered stent.  She did have a totally occluded right SFA from the origin down to the adductor canal with three-vessel runoff as well as a 95% distal left common femoral artery stenosis with two-vessel runoff.  Her claudication symptoms  have markedly improved as has her left ABI from 0.45 up to 0.77.   Since I saw her 2 years ago she does continue to smoke a half a pack a day which is somewhat improved.  Her claudication has remained fairly stable.  She complains of mild claudication on the right as well is on the left.  Her last lower extremity arterial Doppler study performed 06/29/2021 revealed a patent right common iliac artery stent.  She apparently was hospitalized in October at [facility] with pleural effusion.  2D echo revealed normal LV systolic function and apparently a perfusion study showed an abnormality for which she is following up with Dr. Krasowski in the next couple weeks.   Because of recurrent left lower extremity claudication I did obtain Doppler studies 12/06/2023 revealing a decline in her left ABI from 0.76-0.66 with what appears to be an occluded left external iliac artery and common femoral artery.  This is currently lifestyle limiting.  I performed peripheral angiography on her 12/30/2023 revealing a patent right common iliac artery stent, occluded right SFA which was old and an occluded distal left external iliac artery and common femoral artery which was new.  I did not think she had percutaneous options and subsequently referred her to Dr. Rosalva Comber, vein and vascular specialist, who saw her on 01/27/2024 who agreed that endarterectomy and patch angioplasty was the best revascularization option however the patient did have an intercurrent left-sided stroke and is currently being worked up by neurology.  Unfortunately, she continued continues to smoke 1/2 pack/day which  is an improvement compared to previously.  Current Meds  Medication Sig   albuterol  (VENTOLIN  HFA) 108 (90 Base) MCG/ACT inhaler Inhale 2 puffs into the lungs every 6 (six) hours as needed (Asthma).   aspirin  EC 81 MG tablet Take 1 tablet (81 mg total) by mouth daily. Swallow whole.   atorvastatin  (LIPITOR ) 80 MG tablet Take 1 tablet (80 mg  total) by mouth daily.   carvedilol  (COREG ) 12.5 MG tablet Take 2 tablets (25 mg total) by mouth 2 (two) times daily with a meal.   clopidogrel  (PLAVIX ) 75 MG tablet Take 75 mg by mouth daily.   Continuous Glucose Receiver (FREESTYLE LIBRE 2 READER) DEVI every 14 (fourteen) days.   Continuous Glucose Sensor (FREESTYLE LIBRE 2 PLUS SENSOR) MISC every 14 (fourteen) days.   empagliflozin  (JARDIANCE ) 25 MG TABS tablet Take 25 mg by mouth daily.   gabapentin  (NEURONTIN ) 100 MG capsule Take 100 mg by mouth 3 (three) times daily.   glipiZIDE (GLUCOTROL XL) 10 MG 24 hr tablet Take 10 mg by mouth daily.   losartan  (COZAAR ) 50 MG tablet Take 1 tablet (50 mg total) by mouth daily.   metFORMIN  (GLUCOPHAGE -XR) 500 MG 24 hr tablet Take 2 tablets (1,000 mg total) by mouth 2 (two) times daily.   nitroGLYCERIN  (NITROSTAT ) 0.4 MG SL tablet DISSOLVE ONE TABLET UNDER THE TONGUE EVERY 5 MINUTES AS NEEDED (Patient taking differently: Place 0.4 mg under the tongue every 5 (five) minutes as needed for chest pain.)   NOVOLIN 70/30 KWIKPEN (70-30) 100 UNIT/ML KwikPen Inject into the skin.   Semaglutide (OZEMPIC, 0.25 OR 0.5 MG/DOSE, [place]) Inject into the skin once a week.   torsemide  (DEMADEX ) 20 MG tablet Take 20 mg by mouth daily.     No Known Allergies  Social History   Socioeconomic History   Marital status: Single    Spouse name: Not on file   Number of children: Not on file   Years of education: Not on file   Highest education level: Not on file  Occupational History   Not on file  Tobacco Use   Smoking status: Every Day    Current packs/day: 0.50    Average packs/day: 0.5 packs/day for 21.0 years (10.5 ttl pk-yrs)    Types: Cigarettes   Smokeless tobacco: Never  Substance and Sexual Activity   Alcohol use: Yes    Comment: rare social   Drug use: No    Comment: x1 episode "Meth" use.   Sexual activity: Not Currently    Birth control/protection: None  Other Topics Concern   Not on file  Social  History Narrative   Not on file   Social Drivers of Health   Financial Resource Strain: Not on file  Food Insecurity: Not on file  Transportation Needs: Not on file  Physical Activity: Not on file  Stress: Not on file  Social Connections: Not on file  Intimate Partner Violence: Not on file     Review of Systems: General: negative for chills, fever, night sweats or weight changes.  Cardiovascular: negative for chest pain, dyspnea on exertion, edema, orthopnea, palpitations, paroxysmal nocturnal dyspnea or shortness of breath Dermatological: negative for rash Respiratory: negative for cough or wheezing Urologic: negative for hematuria Abdominal: negative for nausea, vomiting, diarrhea, bright red blood per rectum, melena, or hematemesis Neurologic: negative for visual changes, syncope, or dizziness All other systems reviewed and are otherwise negative except as noted above.    Blood pressure 120/76, pulse 82, height 5\' 7"  (1.702 m),  weight 200 lb (90.7 kg), SpO2 98%.  General appearance: alert and no distress Neck: no adenopathy, no carotid bruit, no JVD, supple, symmetrical, trachea midline, and thyroid not enlarged, symmetric, no tenderness/mass/nodules Lungs: clear to auscultation bilaterally Heart: regular rate and rhythm, S1, S2 normal, no murmur, click, rub or gallop Extremities: extremities normal, atraumatic, no cyanosis or edema Pulses: 2+ and symmetric Skin: Skin color, texture, turgor normal. No rashes or lesions Neurologic: Grossly normal  EKG not performed today      ASSESSMENT AND PLAN:   Claudication in peripheral vascular disease (HCC) History of PAD status post peripheral angiography which I performed 12/29/2021 revealing subtotal occluded right common iliac artery which I stented using a 7 mm x 39 mm long VBX covered stent.  She did have a totally occluded right SFA from the origin down to the adductor canal with three-vessel runoff as well as a 95% left common  femoral artery stenosis with two-vessel runoff.  Claudication symptoms markedly improved.  Because of progressive left lower extremity claudication and decline in her left ABI from 0.76-0.66 I proceeded with outpatient peripheral angiography on her 12/30/2023 revealing a patent iliac stent on the right, and total occlusion of her left distal external iliac artery and common femoral artery.  She had three-vessel runoff.  I did not think she had percutaneous options and referred her to Dr. Rosalva Comber, vein and vascular specialist, who agreed that she would best be served with endarterectomy and patch angioplasty however in the interim she did develop a stroke and is scheduled to see a neurologist for neurologic clearance.     Avanell Leigh MD FACP,FACC,FAHA, [facility] 04/10/2024 10:07 AM

## 2024-04-17 ENCOUNTER — Ambulatory Visit: Payer: Self-pay | Admitting: Neurology

## 2024-04-17 ENCOUNTER — Encounter: Payer: Self-pay | Admitting: Neurology

## 2024-04-17 VITALS — BP 219/119 | HR 100 | Ht 67.0 in | Wt 198.5 lb

## 2024-04-17 DIAGNOSIS — I6381 Other cerebral infarction due to occlusion or stenosis of small artery: Secondary | ICD-10-CM

## 2024-04-17 DIAGNOSIS — I10 Essential (primary) hypertension: Secondary | ICD-10-CM | POA: Diagnosis not present

## 2024-04-17 NOTE — Patient Instructions (Signed)
 Continue current medication including aspirin  and Plavix  Referral to physical and occupational therapy Continue your other medications Continue to follow up with PCP for management of elevated blood pressure Follow-up in a year or sooner if worse

## 2024-04-17 NOTE — Progress Notes (Signed)
 Office Note     CC: Occluded left common femoral artery with lifestyle liming claudication Requesting Provider:  Teofilo Fellers, NP  HPI: [name] is a 48 y.o. (1976-01-18) female presenting via phone call with known occlusion of the left common femoral artery and severe, lifestyle and claudication.  She was at her home, and I was at my office.    At her last visit, she noted strokelike symptoms.  While we discussed surgery, asked that she be cleared by cardiology, as well as see neurology.  Imaging demonstrated stroke.  She is now optimized on aspirin , Plavix , statin.    Since last seen, [name] has been doing fair.  She continues to have severe, lifestyle-limiting claudication.  The leg hurts her at night on some occasions.  She denies tissue loss.  A native of [place], she continues to live there.  Medical history is extensive at the age of 42, and includes NSTEMI-CABG, type 2 diabetes, PAD-right iliac artery stenting - [place].  She continues to work full-time, daily smoker roughly half pack per day.     The pt is not on a statin for cholesterol management.  The pt is  on a daily aspirin .   Other AC:  - The pt is  on medication for hypertension.   The pt is  diabetic.  Tobacco hx:  current  Past Medical History:  Diagnosis Date   Abnormal stress test 08/20/2016   Formatting of this note might be different from the original. Added automatically from request for surgery [id number]   Acute appendicitis 01/28/2016   Acute appendicitis with localized peritonitis 01/28/2016   Acute non-ST segment elevation myocardial infarction (HCC) 03/17/2021   Anemia    Angina pectoris (HCC) 06/27/2020   Anxiety disorder 03/17/2021   Calculus of gallbladder 06/05/2016   Cardiomyopathy (HCC)    "postpartem cardiomyopathy"- 2012- has since been released by cardiology   Coronary artery disease 06/27/2020   COVID 10/14/2020   c/o cough and chest pressure    CVA (cerebral vascular accident) (HCC)     Depression    Dyslipidemia 06/27/2020   Dyspnea on exertion 06/27/2020   History of coronary artery bypass graft 09/22/2016   Formatting of this note might be different from the original. LIMA to LAD, SVG to PDA in September 2017 [facility]   Hyperglycemia due to type 2 diabetes mellitus (HCC) 02/25/2016   Hypertension    Liver abscess    2012- Bradly Cage drained placed for awhile.   Long-term insulin  use (HCC) 01/26/2017   Mixed hyperlipidemia 02/25/2016   NSTEMI (non-ST elevated myocardial infarction) (HCC) 06/07/2020   Obesity 09/18/2011   PCOS (polycystic ovarian syndrome) 02/25/2016   Persistent microalbuminuria associated with type 2 diabetes mellitus (HCC) 02/25/2016   Renal failure 09/25/2011   Due to vancomycin , IV contrast, sepsis.     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  Was hospitalized, intubated, had cardiopulmonary failure.    Tobacco abuse    Type 2 diabetes mellitus with hyperglycemia (HCC) 02/25/2016    Past Surgical History:  Procedure Laterality Date   ABDOMINAL AORTOGRAM W/LOWER EXTREMITY N/A 12/29/2021   Procedure: ABDOMINAL AORTOGRAM W/LOWER EXTREMITY;  Surgeon: Avanell Leigh, MD;  Location: [facility] INVASIVE CV LAB;  Service: Cardiovascular;  Laterality: N/A;   ABDOMINAL AORTOGRAM W/LOWER EXTREMITY N/A 12/30/2023   Procedure: ABDOMINAL AORTOGRAM W/LOWER EXTREMITY;  Surgeon: Avanell Leigh, MD;  Location: [facility] INVASIVE CV LAB;  Service: Cardiovascular;  Laterality: N/A;   APPENDECTOMY     CORONARY ARTERY BYPASS GRAFT  2017   [facility]   CRYOTHERAPY     90's- cervix   DILATION AND CURETTAGE OF UTERUS  1994   LAPAROSCOPIC APPENDECTOMY N/A 01/28/2016   Procedure: APPENDECTOMY  LAPAROSCOPIC;  Surgeon: Lockie Rima, MD;  Location: [facility] OR;  Service: General;  Laterality: N/A;   LEFT HEART CATH AND CORS/GRAFTS ANGIOGRAPHY N/A 06/07/2020   Procedure: LEFT HEART CATH AND CORS/GRAFTS ANGIOGRAPHY;  Surgeon: Swaziland, Peter M, MD;  Location: [facility] INVASIVE CV LAB;  Service: Cardiovascular;  Laterality: N/A;   PERIPHERAL VASCULAR INTERVENTION  12/29/2021   Procedure: PERIPHERAL VASCULAR INTERVENTION;  Surgeon: Avanell Leigh, MD;  Location: [facility] INVAS[facility] CV [facility];  Service: Cardiovascular;;  Rt. Iliac   ROBOTIC ASSISTED SALPINGO OOPHERECTOMY Right 03/24/2016   Procedure: XI ROBOTIC ASSISTED LAPAROSCOPIC RIGHT SALPINGO OOPHORECTOMY;  Surgeon: Alphonso Aschoff, MD;  Location: [facility] ORS;  Service: Gynecology;  Laterality: Right;   TONSILLECTOMY      Social History   Socioeconomic History   Marital status: Single    Spouse name: Not on file   Number of children: Not on file   Years of education: Not on file   Highest education level: Not on file  Occupational History   Not on file  Tobacco Use   Smoking status: Every Day    Current packs/day: 0.50    Average packs/day: 0.5 packs/day for 21.0 years (10.5 ttl pk-yrs)    Types: Cigarettes   Smokeless tobacco: Never  Substance and Sexual Activity   Alcohol use: Yes    Comment: rare social   Drug use: No    Comment: x1 episode "Meth" use.   Sexual activity: Not Currently    Birth control/protection: None  Other Topics Concern   Not on file  Social History Narrative   Not on file   Social Drivers of Health   Financial Resource Strain: Not on file  Food Insecurity: Not on file  Transportation Needs: Not on file  Physical Activity: Not on file  Stress: Not on file  Social Connections: Not on file  Intimate Partner Violence: Not on file   Family History  Problem Relation Age of Onset   CAD Mother    CAD Father    Diabetes Maternal Grandmother    Diabetes Paternal Grandfather     Current Outpatient Medications  Medication  Sig Dispense Refill   albuterol  (VENTOLIN  HFA) 108 (90 Base) MCG/ACT inhaler Inhale 2 puffs into the lungs every 6 (six) hours as needed (Asthma).     aspirin  EC 81 MG tablet Take 1 tablet (81 mg total) by mouth daily. Swallow whole. 90 tablet 3   atorvastatin  (LIPITOR ) 80 MG tablet Take 1 tablet (80 mg total) by mouth daily. 90 tablet 3   carvedilol  (COREG ) 12.5 MG tablet Take 2 tablets (25 mg total) by mouth 2 (two) times daily with a meal. 180 tablet 1   clopidogrel  (PLAVIX ) 75 MG tablet Take 75 mg by mouth daily.     Continuous Glucose Receiver (FREESTYLE LIBRE 2 READER) DEVI every 14 (fourteen) days.     Continuous Glucose Sensor (FREESTYLE LIBRE 2 PLUS SENSOR) MISC every 14 (fourteen) days.     empagliflozin  (JARDIANCE ) 25 MG TABS tablet Take 25 mg by  mouth daily.     gabapentin  (NEURONTIN ) 100 MG capsule Take 100 mg by mouth 3 (three) times daily.     glipiZIDE (GLUCOTROL XL) 10 MG 24 hr tablet Take 10 mg by mouth daily.     losartan  (COZAAR ) 50 MG tablet Take 1 tablet (50 mg total) by mouth daily. 90 tablet 3   metFORMIN  (GLUCOPHAGE -XR) 500 MG 24 hr tablet Take 2 tablets (1,000 mg total) by mouth 2 (two) times daily.     nitroGLYCERIN  (NITROSTAT ) 0.4 MG SL tablet DISSOLVE ONE TABLET UNDER THE TONGUE EVERY 5 MINUTES AS NEEDED (Patient taking differently: Place 0.4 mg under the tongue every 5 (five) minutes as needed for chest pain.) 25 tablet 0   NOVOLIN 70/30 KWIKPEN (70-30) 100 UNIT/ML KwikPen Inject into the skin.     Semaglutide (OZEMPIC, 0.25 OR 0.5 MG/DOSE, [place]) Inject into the skin once a week.     torsemide  (DEMADEX ) 20 MG tablet Take 20 mg by mouth daily.     No current facility-administered medications for this visit.    No Known Allergies   REVIEW OF SYSTEMS:  [X]  denotes positive finding, [ ]  denotes negative finding Cardiac  Comments:  Chest pain or chest pressure:    Shortness of breath upon exertion:    Short of breath when lying flat:    Irregular heart rhythm:         Vascular    Pain in calf, thigh, or hip brought on by ambulation:    Pain in feet at night that wakes you up from your sleep:     Blood clot in your veins:    Leg swelling:         Pulmonary    Oxygen at home:    Productive cough:     Wheezing:         Neurologic    Sudden weakness in arms or legs:     Sudden numbness in arms or legs:     Sudden onset of difficulty speaking or slurred speech:    Temporary loss of vision in one eye:     Problems with dizziness:         Gastrointestinal    Blood in stool:     Vomited blood:         Genitourinary    Burning when urinating:     Blood in urine:        Psychiatric    Major depression:         Hematologic    Bleeding problems:    Problems with blood clotting too easily:        Skin    Rashes or ulcers:        Constitutional    Fever or chills:      PHYSICAL EXAMINATION:  From her last visit** General:  WDWN in NAD; vital signs documented above, obese Gait: Not observed HENT: WNL, normocephalic Pulmonary: normal non-labored breathing , without wheezing Cardiac: regular HR Abdomen: soft, NT, no masses Skin: without rashes Vascular Exam/Pulses:  Right Left  Radial 2+ (normal) 2+ (normal)  Ulnar    Femoral  absent  Popliteal    DP nonpalpable nonpalpable  PT     Extremities: without ischemic changes, without Gangrene , without cellulitis; without open wounds;  Musculoskeletal: no muscle wasting or atrophy  Neurologic: A&O X 3;  No focal weakness or paresthesias are detected Psychiatric:  The pt has Normal affect.   Non-Invasive Vascular Imaging:    ABI Findings:  +---------+------------------+-----+----------+--------+  Right   Rt Pressure (mmHg)IndexWaveform  Comment   +---------+------------------+-----+----------+--------+  Brachial 166                                        +---------+------------------+-----+----------+--------+  PTA     121               0.73 monophasic           +---------+------------------+-----+----------+--------+  DP      115               0.69 monophasic          +---------+------------------+-----+----------+--------+  Great Toe94                0.57 Abnormal            +---------+------------------+-----+----------+--------+   +---------+------------------+-----+----------+-------+  Left    Lt Pressure (mmHg)IndexWaveform  Comment  +---------+------------------+-----+----------+-------+  Brachial 159                                       +---------+------------------+-----+----------+-------+  PTA     104               0.63 monophasic         +---------+------------------+-----+----------+-------+  DP      109               0.66 monophasic         +---------+------------------+-----+----------+-------+  Great Toe91                0.55 Abnormal           +---------+------------------+-----+----------+-------+   +-------+-----------+-----------+------------+------------+  ABI/TBIToday's ABIToday's TBIPrevious ABIPrevious TBI  +-------+-----------+-----------+------------+------------+  Right .73        .57        .69         .45           +-------+-----------+-----------+------------+------------+  Left  .66        .55        .76         .40           +-------+-----------+-----------+------------+------------+     ASSESSMENT/PLAN: [name] is a 48 y.o. female presenting with left lower extremity lifestyle-limiting claudication.  I find it interesting that her right leg does not cause more symptoms, especially as her ABIs are relatively symmetric.  Regardless, imaging demonstrates left-sided common femoral artery occlusion with reconstitution of the profunda and superficial femoral artery distally  I think [name] would have significant improvement with left iliofemoral endarterectomy with patch plasty.  With her medical comorbidities including diabetes, as well as her  obesity, she is at high risk for wound breakdown and infection.  I was very clear about this during our discussion.  After discussing the risks and benefits, [name] elected to proceed with surgery.    She has been cleared by both neurology and cardiology.  I plan to schedule her for left-sided iliofemoral endarterectomy at her convenience.    Kayla Part, MD [facility] [phone] Total time of patient care including pre-visit research, consultation, and documentation greater than 20 minutes

## 2024-04-17 NOTE — Progress Notes (Unsigned)
 [facility]  PATIENT: [name] DOB: 28-Mar-1976  REQUESTING CLINICIAN: Manfred Seed, MD HISTORY FROM: Patient and daughter  REASON FOR VISIT: Follow up Stroke in January    HISTORICAL  CHIEF COMPLAINT:  Chief Complaint  Patient presents with   New Patient (Initial Visit)    Pt in 13, daughter present, Pt is here for CVA followup: uses cane for mobility since has abnormal gate since cva occurred. Pt stated that her abilities have been restricted due to weakness. She stated that her fine motor skills are suffering such as hand dexterity issues. Left arm and 2 finger numbness, that radiates through the left torso. Right side of face seems always swollen and she constatly feels like she drools out of right side and right side of tongue has the feeling of hair on it.     HISTORY OF PRESENT ILLNESS:  This is a 48 year old woman past medical history of hypertension, hyperlipidemia, diabetes mellitus, coronary artery disease, peripheral disease status post stent placement supposed to be on aspirin  and Plavix  but only taking aspirin  who is presenting after being admitted to [facility] for right-sided weakness, numbness and found to have a acute left thalamocapsular stroke.  Patient's symptom started 5 days prior to presentation with numbness on the right side of her lip and lower face, she also reported right-sided numbness in the arm and leg.  Tells me that symptoms did not improve so she presented to the ED.  In the ED, patient had MRI which showed acute stroke in the left thalamic capsular region.  Stroke etiology likely small vessel disease but cannot rule out large vessel disease. Her workup was completed, and patient admitted.  Upon discharge, she was recommended both aspirin  and Plavix  and acute rehab.  She tells me that at that time she did not have insurance therefore could not go to rehab but she has switched insurance and is ready for rehab.  She has  been using a cane with ambulation, states that her strength is getting better but she is not back to 100%. She is still complaining of abnormal sensation involving her right side of face and RUE. She still having issue with her blood pressure, it remains elevated.  OTHER MEDICAL CONDITIONS: Recent stroke, Hypertension, hyperlipidemia, diabetes, COPD, tobacco use, peripheral vascular disease   REVIEW OF SYSTEMS: Full 14 system review of systems performed and negative with exception of: As noted in the HPI.  ALLERGIES: No Known Allergies  HOME MEDICATIONS: Outpatient Medications Prior to Visit  Medication Sig Dispense Refill   albuterol  (VENTOLIN  HFA) 108 (90 Base) MCG/ACT inhaler Inhale 2 puffs into the lungs every 6 (six) hours as needed (Asthma).     aspirin  EC 81 MG tablet Take 1 tablet (81 mg total) by mouth daily. Swallow whole. 90 tablet 3   atorvastatin  (LIPITOR ) 80 MG tablet Take 1 tablet (80 mg total) by mouth daily. 90 tablet 3   carvedilol  (COREG ) 12.5 MG tablet Take 2 tablets (25 mg total) by mouth 2 (two) times daily with a meal. 180 tablet 1   clopidogrel  (PLAVIX ) 75 MG tablet Take 75 mg by mouth daily.     Continuous Glucose Receiver (FREESTYLE LIBRE 2 READER) DEVI every 14 (fourteen) days.     Continuous Glucose Sensor (FREESTYLE LIBRE 2 PLUS SENSOR) MISC every 14 (fourteen) days.     empagliflozin  (JARDIANCE ) 25 MG TABS tablet Take 25 mg by mouth daily.     gabapentin  (NEURONTIN ) 100 MG capsule Take 100  mg by mouth 3 (three) times daily.     glipiZIDE (GLUCOTROL XL) 10 MG 24 hr tablet Take 10 mg by mouth daily.     losartan  (COZAAR ) 50 MG tablet Take 1 tablet (50 mg total) by mouth daily. 90 tablet 3   metFORMIN  (GLUCOPHAGE -XR) 500 MG 24 hr tablet Take 2 tablets (1,000 mg total) by mouth 2 (two) times daily.     nitroGLYCERIN  (NITROSTAT ) 0.4 MG SL tablet DISSOLVE ONE TABLET UNDER THE TONGUE EVERY 5 MINUTES AS NEEDED (Patient taking differently: Place 0.4 mg under the tongue  every 5 (five) minutes as needed for chest pain.) 25 tablet 0   NOVOLIN 70/30 KWIKPEN (70-30) 100 UNIT/ML KwikPen Inject 25 Units into the skin in the morning and at bedtime.     Semaglutide (OZEMPIC, 0.25 OR 0.5 MG/DOSE, [place]) Inject into the skin once a week.     torsemide  (DEMADEX ) 20 MG tablet Take 20 mg by mouth daily.     No facility-administered medications prior to visit.    PAST MEDICAL HISTORY: Past Medical History:  Diagnosis Date   Abnormal stress test 08/20/2016   Formatting of this note might be different from the original. Added automatically from request for surgery [id number]   Acute appendicitis 01/28/2016   Acute appendicitis with localized peritonitis 01/28/2016   Acute non-ST segment elevation myocardial infarction (HCC) 03/17/2021   Anemia    Angina pectoris (HCC) 06/27/2020   Anxiety disorder 03/17/2021   Calculus of gallbladder 06/05/2016   Cardiomyopathy (HCC)    "postpartem cardiomyopathy"- 2012- has since been released by cardiology   Coronary artery disease 06/27/2020   COVID 10/14/2020   c/o cough and chest pressure    CVA (cerebral vascular accident) (HCC)    Depression    Dyslipidemia 06/27/2020   Dyspnea on exertion 06/27/2020   History of coronary artery bypass graft 09/22/2016   Formatting of this note might be different from the original. LIMA to LAD, SVG to PDA in September 2017 at [facility]   Hyperglycemia due to type 2 diabetes mellitus (HCC) 02/25/2016   Hypertension    Liver abscess    2012- Bradly Cage drained placed for awhile.   Long-term insulin  use (HCC) 01/26/2017   Mixed hyperlipidemia 02/25/2016   NSTEMI (non-ST elevated myocardial infarction) (HCC) 06/07/2020   Obesity 09/18/2011   PCOS (polycystic ovarian syndrome) 02/25/2016   Persistent microalbuminuria associated with type 2 diabetes mellitus (HCC) 02/25/2016   Renal failure 09/25/2011   Due to vancomycin , IV contrast, sepsis.     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  Was hospitalized, intubated, had cardiopulmonary failure.    Tobacco abuse    Type 2 diabetes mellitus with hyperglycemia (HCC) 02/25/2016    PAST SURGICAL HISTORY: Past Surgical History:  Procedure Laterality Date   ABDOMINAL AORTOGRAM W/LOWER EXTREMITY N/A 12/29/2021   Procedure: ABDOMINAL AORTOGRAM W/LOWER EXTREMITY;  Surgeon: Avanell Leigh, MD;  Location: [facility] INVASIVE CV LAB;  Service: Cardiovascular;  Laterality: N/A;   ABDOMINAL AORTOGRAM W/LOWER EXTREMITY N/A 12/30/2023   Procedure: ABDOMINAL AORTOGRAM W/LOWER EXTREMITY;  Surgeon: Avanell Leigh, MD;  Location: [facility] INVASIVE CV LAB;  Service: Cardiovascular;  Laterality: N/A;   APPENDECTOMY     CORONARY ARTERY BYPASS GRAFT  2017  [facility]   CRYOTHERAPY     90's- cervix   DILATION AND CURETTAGE OF UTERUS  1994   LAPAROSCOPIC APPENDECTOMY N/A 01/28/2016   Procedure: APPENDECTOMY LAPAROSCOPIC;  Surgeon: Lockie Rima, MD;  Location: [facility] OR;  Service: General;  Laterality: N/A;   LEFT HEART CATH AND CORS/GRAFTS ANGIOGRAPHY N/A 06/07/2020   Procedure: LEFT HEART CATH AND CORS/GRAFTS ANGIOGRAPHY;  Surgeon: [place], Peter M, MD;  Location: [facility] INVASIVE CV LAB;  Service: Cardiovascular;  Laterality: N/A;   PERIPHERAL VASCULAR INTERVENTION  12/29/2021   Procedure: PERIPHERAL VASCULAR INTERVENTION;  Surgeon: Avanell Leigh, MD;  Location: [facility] INVASIVE CV LAB;  Service: Cardiovascular;;  Rt. Iliac   ROBOTIC ASSISTED SALPINGO OOPHERECTOMY Right 03/24/2016   Procedure: XI ROBOTIC ASSISTED LAPAROSCOPIC RIGHT SALPINGO OOPHORECTOMY;  Surgeon: Alphonso Aschoff, MD;  Location: [facility] ORS;  Service: Gynecology;  Laterality: Right;   TONSILLECTOMY      FAMILY HISTORY: Family History   Problem Relation Age of Onset   CAD Mother    CAD Father    Diabetes Maternal Grandmother    Diabetes Paternal Grandfather     SOCIAL HISTORY: Social History   Socioeconomic History   Marital status: Single    Spouse name: Not on file   Number of children: Not on file   Years of education: Not on file   Highest education level: Not on file  Occupational History   Not on file  Tobacco Use   Smoking status: Every Day    Current packs/day: 0.50    Average packs/day: 0.5 packs/day for 21.0 years (10.5 ttl pk-yrs)    Types: Cigarettes   Smokeless tobacco: Never  Substance and Sexual Activity   Alcohol use: Yes    Comment: rare social   Drug use: No    Comment: x1 episode "Meth" use.   Sexual activity: Not Currently    Birth control/protection: None  Other Topics Concern   Not on file  Social History Narrative   Not on file   Social Drivers of Health   Financial Resource Strain: Not on file  Food Insecurity: Not on file  Transportation Needs: Not on file  Physical Activity: Not on file  Stress: Not on file  Social Connections: Not on file  Intimate Partner Violence: Not on file    PHYSICAL EXAM   GENERAL EXAM/CONSTITUTIONAL: Vitals:  Vitals:   04/17/24 1409 04/17/24 1412  BP: (!) 188/109 (!) 219/119  Pulse:  100  Weight:  198 lb 8 oz (90 kg)  Height:  5\' 7"  (1.702 m)   Body mass index is 31.09 kg/m. Wt Readings from Last 3 Encounters:  04/17/24 198 lb 8 oz (90 kg)  04/10/24 200 lb (90.7 kg)  02/09/24 202 lb (91.6 kg)   Patient is in no distress; well developed, nourished and groomed; neck is supple  MUSCULOSKELETAL: Gait, strength, tone, movements noted in Neurologic exam below  NEUROLOGIC: MENTAL STATUS:      No data to display         awake, alert, oriented to person, place and time recent and remote memory intact normal attention and concentration language fluent, comprehension intact, naming intact fund of knowledge  appropriate  CRANIAL NERVE:  2nd, 3rd, 4th, 6th - Visual fields full to confrontation, extraocular muscles intact, no nystagmus 5th - facial sensation symmetric 7th - facial strength symmetric 8th - hearing intact 9th - palate elevates symmetrically, uvula midline 11th - shoulder shrug symmetric 12th - tongue protrusion midline  MOTOR:  normal  bulk and tone, mild weakness in the RUE and RLE 4/5  SENSORY:  normal and symmetric to light touch  COORDINATION:  finger-nose-finger, fine finger movements normal  GAIT/STATION:  Ambulates with a cane     DIAGNOSTIC DATA (LABS, IMAGING, TESTING) - I reviewed patient records, labs, notes, testing and imaging myself where available.  Lab Results  Component Value Date   WBC 8.0 12/28/2023   HGB 17.0 (H) 12/28/2023   HCT 54.8 (H) 12/28/2023   MCV 83 12/28/2023   PLT 193 12/28/2023      Component Value Date/Time   NA 134 12/28/2023 0944   K 5.4 (H) 12/28/2023 0944   CL 96 12/28/2023 0944   CO2 23 12/28/2023 0944   GLUCOSE 476 (H) 12/28/2023 0944   GLUCOSE 269 (H) 09/01/2022 0727   BUN 15 12/28/2023 0944   CREATININE 0.68 12/28/2023 0944   CREATININE 1.04 09/25/2011 1122   CALCIUM  9.2 12/28/2023 0944   PROT 6.7 05/20/2022 0024   PROT 6.3 12/02/2021 0926   ALBUMIN 3.7 05/20/2022 0024   ALBUMIN 4.4 12/02/2021 0926   AST 11 (L) 05/20/2022 0024   ALT 11 05/20/2022 0024   ALKPHOS 54 05/20/2022 0024   BILITOT 0.3 05/20/2022 0024   BILITOT 0.4 12/02/2021 0926   GFRNONAA >60 09/01/2022 0727   GFRAA 120 02/11/2021 1620   Lab Results  Component Value Date   CHOL 99 (L) 12/02/2021   HDL 30 (L) 12/02/2021   LDLCALC 45 12/02/2021   TRIG 132 12/02/2021   CHOLHDL 3.3 12/02/2021   Lab Results  Component Value Date   HGBA1C 9.6 (H) 06/07/2020   No results found for: "VITAMINB12" No results found for: "TSH"  MRI brain without contrast January 04, 2024 Acute infarct of the posterior limb left internal capsule  CT angiogram  head and neck 01/04/2024 Mild atherosclerotic changes with no significant stenosis, large vessel occlusion or aneurysmal dilatation.  Small early subacute infarct in the left thalamocapsular region. Incidental finding of subcentimeter noted in the right superficial parotid and calcified thyroid nodule  Echocardiogram 01/05/2024: Left ventricular ejection fraction is 50 to 55%.  There is concentric hypertrophy of the left ventricular wall.  Left ventricular chamber size is moderately dilated.  The left atrium is moderately dilated.  There is moderate mitral regurgitation.  Lipid panel 01/04/2024 LDL 150.4 HDL 31 Total cholesterol 249    ASSESSMENT AND PLAN  48 y.o. year old female with vascular risk factor including hypertension, hyperlipidemia, diabetes mellitus, coronary artery disease and peripheral vascular disease who is presenting after being diagnosed with a left thalamic capsular stroke.  Stroke etiology likely small vessel disease but cannot rule out large vessel disease.  Patient is currently on aspirin  and Plavix  and also on Lipitor .  He was recommended outpatient therapy but stated due to lack of insurance he was not able to complete it.  Will send patient for both physical and Occupational Therapy as patient also reports some difficulty with hand dexterity.  Advised her to continue current medication, advised her to follow-up with PCP regarding her elevated blood pressure and I will see her in 1 year for follow-up.  Advised patient to contact me if she has any additional questions or concerns.  She is comfortable with plans.   1. Cerebrovascular accident (CVA) due to occlusion of small artery (HCC)   2. Primary hypertension      Patient Instructions  Continue current medication including aspirin  and Plavix  Referral to physical and occupational therapy Continue your other  medications Continue to follow up with PCP for management of elevated blood pressure Follow-up in a year or  sooner if worse  Orders Placed This Encounter  Procedures   Ambulatory referral to Physical Therapy   Ambulatory referral to Occupational Therapy    No orders of the defined types were placed in this encounter.   Return in about 1 year (around 04/17/2025).  I have spent a total of 65 minutes dedicated to this patient today, preparing to see patient, performing a medically appropriate examination and evaluation, ordering tests and/or medications and procedures, and counseling and educating the patient/family/caregiver; independently interpreting result and communicating results to the family/patient/caregiver; and documenting clinical information in the electronic medical record.   Cassandra Cleveland, MD 04/18/2024, 9:17 PM  [facility] [address] [place], [place] [address] [phone]

## 2024-04-19 ENCOUNTER — Telehealth: Payer: Self-pay | Admitting: Neurology

## 2024-04-19 NOTE — Telephone Encounter (Signed)
 Referral occupational therapy fax to [facility]. Phone: [phone], Fax: [phone]

## 2024-04-19 NOTE — Telephone Encounter (Signed)
 Referral for physical therapy fax to [facility]. Phone: [phone], Fax: [phone]

## 2024-04-20 ENCOUNTER — Ambulatory Visit: Payer: PRIVATE HEALTH INSURANCE | Attending: Vascular Surgery | Admitting: Vascular Surgery

## 2024-04-20 DIAGNOSIS — I739 Peripheral vascular disease, unspecified: Secondary | ICD-10-CM | POA: Diagnosis present

## 2024-04-21 ENCOUNTER — Other Ambulatory Visit: Payer: Self-pay

## 2024-04-21 DIAGNOSIS — I739 Peripheral vascular disease, unspecified: Secondary | ICD-10-CM

## 2024-04-25 NOTE — Progress Notes (Signed)
 Surgical Instructions   Your procedure is scheduled on Friday, May 16th Report to [facility] Main Entrance "A" at 9:30 A.M., then check in with the Admitting office. Any questions or running late day of surgery: call [phone]  Questions prior to your surgery date: call [phone], Monday-Friday, 8am-4pm. If you experience any cold or flu symptoms such as cough, fever, chills, shortness of breath, etc. between now and your scheduled surgery, please notify us  at the above number.     Remember:  Do not eat or drink after midnight the night before your surgery  Take these medicines the morning of surgery with A SIP OF WATER   aspirin   atorvastatin  (LIPITOR )  carvedilol  (COREG )  clopidogrel  (PLAVIX )  gabapentin  (NEURONTIN )    May take these medicines IF NEEDED: albuterol  (VENTOLIN  HFA) inhaler nitroGLYCERIN  (NITROSTAT ) - please call [phone] after taking this medication   One week prior to surgery, STOP taking any Aleve, Naproxen, Ibuprofen , Motrin , Advil , Goody's, BC's, all herbal medications, fish oil, and non-prescription vitamins.  WHAT DO I DO ABOUT MY DIABETES MEDICATION?   Do not take oral diabetes medicines empagliflozin  (JARDIANCE ), glipiZIDE (GLUCOTROL XL) or metFORMIN  (GLUCOPHAGE -XR)  the morning of surgery. Your last dose of the above mentioned medication's should be on Thursday, May 15th in the morning.    Per your physician instruction's, HOLD your Semaglutide [facility]) for 7 days prior to surgery. Last dose should be on or before Thursday, May 8th.  Follow your physician's instruction regarding NOVOLIN 70/30 insulin . Your last regular dose should be taken on Thursday morning, May 16th.  Take half (50%) of your Thursday, May 16th evening dose and half (50%) of your morning dose on Friday, May 16th.   HOW TO MANAGE YOUR DIABETES BEFORE AND AFTER SURGERY  Why is it important to control my blood sugar before and after surgery? Improving blood sugar levels  before and after surgery helps healing and can limit problems. A way of improving blood sugar control is eating a healthy diet by:  Eating less sugar and carbohydrates  Increasing activity/exercise  Talking with your doctor about reaching your blood sugar goals High blood sugars (greater than 180 mg/dL) can raise your risk of infections and slow your recovery, so you will need to focus on controlling your diabetes during the weeks before surgery. Make sure that the doctor who takes care of your diabetes knows about your planned surgery including the date and location.  How do I manage my blood sugar before surgery? Check your blood sugar at least 4 times a day, starting 2 days before surgery, to make sure that the level is not too high or low.  Check your blood sugar the morning of your surgery when you wake up and every 2 hours until you get to the Short Stay unit.  If your blood sugar is less than 70 mg/dL, you will need to treat for low blood sugar: Do not take insulin . Treat a low blood sugar (less than 70 mg/dL) with  cup of clear juice (cranberry or apple), 4 glucose tablets, OR glucose gel. Recheck blood sugar in 15 minutes after treatment (to make sure it is greater than 70 mg/dL). If your blood sugar is not greater than 70 mg/dL on recheck, call [phone] for further instructions. Report your blood sugar to the short stay nurse when you get to Short Stay.  If you are admitted to the hospital after surgery: Your blood sugar will be checked by the staff and you will probably be given  insulin  after surgery (instead of oral diabetes medicines) to make sure you have good blood sugar levels. The goal for blood sugar control after surgery is 80-180 mg/dL.                     Do NOT Smoke (Tobacco/Vaping) for 24 hours prior to your procedure.  If you use a CPAP at night, you may bring your mask/headgear for your overnight stay.   You will be asked to remove any contacts, glasses,  piercing's, hearing aid's, dentures/partials prior to surgery. Please bring cases for these items if needed.    Patients discharged the day of surgery will not be allowed to drive home, and someone needs to stay with them for 24 hours.  SURGICAL WAITING ROOM VISITATION Patients may have no more than 2 support people in the waiting area - these visitors may rotate.   Pre-op nurse will coordinate an appropriate time for 1 ADULT support person, who may not rotate, to accompany patient in pre-op.  Children under the age of 86 must have an adult with them who is not the patient and must remain in the main waiting area with an adult.  If the patient needs to stay at the hospital during part of their recovery, the visitor guidelines for inpatient rooms apply.  Please refer to the [facility] website for the visitor guidelines for any additional information.   If you received a COVID test during your pre-op visit  it is requested that you wear a mask when out in public, stay away from anyone that may not be feeling well and notify your surgeon if you develop symptoms. If you have been in contact with anyone that has tested positive in the last 10 days please notify you surgeon.      Pre-operative CHG Bathing Instructions   You can play a key role in reducing the risk of infection after surgery. Your skin needs to be as free of germs as possible. You can reduce the number of germs on your skin by washing with CHG (chlorhexidine  gluconate) soap before surgery. CHG is an antiseptic soap that kills germs and continues to kill germs even after washing.   DO NOT use if you have an allergy to chlorhexidine /CHG or antibacterial soaps. If your skin becomes reddened or irritated, stop using the CHG and notify one of our RNs at [phone].              TAKE A SHOWER THE NIGHT BEFORE SURGERY AND THE DAY OF SURGERY    Please keep in mind the following:  DO NOT shave, including legs and underarms, 48 hours  prior to surgery.   You may shave your face before/day of surgery.  Place clean sheets on your bed the night before surgery Use a clean washcloth (not used since being washed) for each shower. DO NOT sleep with pet's night before surgery.  CHG Shower Instructions:  Wash your face and private area with normal soap. If you choose to wash your hair, wash first with your normal shampoo.  After you use shampoo/soap, rinse your hair and body thoroughly to remove shampoo/soap residue.  Turn the water  OFF and apply half the bottle of CHG soap to a CLEAN washcloth.  Apply CHG soap ONLY FROM YOUR NECK DOWN TO YOUR TOES (washing for 3-5 minutes)  DO NOT use CHG soap on face, private areas, open wounds, or sores.  Pay special attention to the area where your surgery is  being performed.  If you are having back surgery, having someone wash your back for you may be helpful. Wait 2 minutes after CHG soap is applied, then you may rinse off the CHG soap.  Pat dry with a clean towel  Put on clean pajamas    Additional instructions for the day of surgery: DO NOT APPLY any lotions, deodorants, cologne, or perfumes.   Do not wear jewelry or makeup Do not wear nail polish, gel polish, artificial nails, or any other type of covering on natural nails (fingers and toes) Do not bring valuables to the hospital. [facility] is not responsible for valuables/personal belongings. Put on clean/comfortable clothes.  Please brush your teeth.  Ask your nurse before applying any prescription medications to the skin.

## 2024-04-26 ENCOUNTER — Encounter (HOSPITAL_COMMUNITY): Payer: Self-pay

## 2024-04-26 ENCOUNTER — Other Ambulatory Visit: Payer: Self-pay

## 2024-04-26 ENCOUNTER — Encounter (HOSPITAL_COMMUNITY)
Admission: RE | Admit: 2024-04-26 | Discharge: 2024-04-26 | Disposition: A | Discharge status: Home / Self Care | Source: Ambulatory Visit | Attending: Vascular Surgery | Admitting: Vascular Surgery

## 2024-04-26 VITALS — BP 154/89 | HR 88 | Temp 98.2°F | Resp 17 | Ht 67.0 in | Wt 198.5 lb

## 2024-04-26 DIAGNOSIS — E785 Hyperlipidemia, unspecified: Secondary | ICD-10-CM | POA: Diagnosis not present

## 2024-04-26 DIAGNOSIS — Z7982 Long term (current) use of aspirin: Secondary | ICD-10-CM | POA: Insufficient documentation

## 2024-04-26 DIAGNOSIS — I739 Peripheral vascular disease, unspecified: Secondary | ICD-10-CM | POA: Insufficient documentation

## 2024-04-26 DIAGNOSIS — I255 Ischemic cardiomyopathy: Secondary | ICD-10-CM | POA: Insufficient documentation

## 2024-04-26 DIAGNOSIS — I69351 Hemiplegia and hemiparesis following cerebral infarction affecting right dominant side: Secondary | ICD-10-CM | POA: Insufficient documentation

## 2024-04-26 DIAGNOSIS — Z951 Presence of aortocoronary bypass graft: Secondary | ICD-10-CM | POA: Insufficient documentation

## 2024-04-26 DIAGNOSIS — E1165 Type 2 diabetes mellitus with hyperglycemia: Secondary | ICD-10-CM | POA: Diagnosis not present

## 2024-04-26 DIAGNOSIS — I2510 Atherosclerotic heart disease of native coronary artery without angina pectoris: Secondary | ICD-10-CM | POA: Insufficient documentation

## 2024-04-26 DIAGNOSIS — I10 Essential (primary) hypertension: Secondary | ICD-10-CM | POA: Diagnosis not present

## 2024-04-26 DIAGNOSIS — Z01812 Encounter for preprocedural laboratory examination: Secondary | ICD-10-CM | POA: Insufficient documentation

## 2024-04-26 DIAGNOSIS — Z7902 Long term (current) use of antithrombotics/antiplatelets: Secondary | ICD-10-CM | POA: Diagnosis not present

## 2024-04-26 DIAGNOSIS — Z01818 Encounter for other preprocedural examination: Secondary | ICD-10-CM | POA: Diagnosis present

## 2024-04-26 DIAGNOSIS — I252 Old myocardial infarction: Secondary | ICD-10-CM | POA: Insufficient documentation

## 2024-04-26 LAB — CBC
HCT: 54.1 % — ABNORMAL HIGH (ref 36.0–46.0)
Hemoglobin: 17.6 g/dL — ABNORMAL HIGH (ref 12.0–15.0)
MCH: 27.2 pg (ref 26.0–34.0)
MCHC: 32.5 g/dL (ref 30.0–36.0)
MCV: 83.6 fL (ref 80.0–100.0)
Platelets: 155 10*3/uL (ref 150–400)
RBC: 6.47 MIL/uL — ABNORMAL HIGH (ref 3.87–5.11)
RDW: 15.4 % (ref 11.5–15.5)
WBC: 8.2 10*3/uL (ref 4.0–10.5)
nRBC: 0 % (ref 0.0–0.2)

## 2024-04-26 LAB — PROTIME-INR
INR: 1 (ref 0.8–1.2)
Prothrombin Time: 12.9 s (ref 11.4–15.2)

## 2024-04-26 LAB — COMPREHENSIVE METABOLIC PANEL WITH GFR
ALT: 16 U/L (ref 0–44)
AST: 15 U/L (ref 15–41)
Albumin: 3.4 g/dL — ABNORMAL LOW (ref 3.5–5.0)
Alkaline Phosphatase: 48 U/L (ref 38–126)
Anion gap: 9 (ref 5–15)
BUN: 10 mg/dL (ref 6–20)
CO2: 28 mmol/L (ref 22–32)
Calcium: 9.3 mg/dL (ref 8.9–10.3)
Chloride: 102 mmol/L (ref 98–111)
Creatinine, Ser: 0.99 mg/dL (ref 0.44–1.00)
GFR, Estimated: >60 mL/min (ref >60)
Glucose, Bld: 219 mg/dL — ABNORMAL HIGH (ref 70–99)
Potassium: 4.4 mmol/L (ref 3.5–5.1)
Sodium: 139 mmol/L (ref 135–145)
Total Bilirubin: 0.9 mg/dL (ref 0.0–1.2)
Total Protein: 6.7 g/dL (ref 6.5–8.1)

## 2024-04-26 LAB — SURGICAL PCR SCREEN
MRSA, PCR: NEGATIVE
Staphylococcus aureus: NEGATIVE

## 2024-04-26 LAB — TYPE AND SCREEN
ABO/RH(D): A POS
Antibody Screen: NEGATIVE

## 2024-04-26 LAB — APTT: aPTT: 28 s (ref 24–36)

## 2024-04-26 LAB — GLUCOSE, CAPILLARY: Glucose-Capillary: 213 mg/dL — ABNORMAL HIGH (ref 70–99)

## 2024-04-26 NOTE — Progress Notes (Addendum)
 Patient unable to urinate at this time.  Will need to collect on day of surgery. Instructed patient that she will need to provide a urine sample on day of surgery.  PCP - Dr. Cristina Donath NP Cardiologist - Dr. Ralene Burger & Lauro Portal - LOV 04-10-24 Neurologist - Dr. Amadou Camara - LOV 04/17/24 w/follow up in 1 year  PPM/ICD - Denies Device Orders - n/a Rep Notified - n/a  Chest x-ray - N/A EKG - 11-11-23 Stress Test - 06-07-20 Peripheral Vascular Catheterization 12-30-23 ECHO - 01-05-24 Cardiac Cath - 06-07-20  Sleep Study - Denies CPAP - N/A  Fasting Blood Sugar - 80-120's - not controlled Checks Blood Sugar _____ times a day. Patient wears a [facility] on right arm, due to be changed on 04-28-24 to left arm.   Last dose of GLP1 agonist-  Ozempic GLP1 instructions: per patient last dose was taken on 04-22-24  Blood Thinner Instructions: Plavix  - continue, take on DOS  Aspirin  Instructions: Continue, take on DOS  ERAS Protcol - NPO PRE-SURGERY Ensure or G2- none  COVID TEST- N/A   Anesthesia review: Yes, HTN, DM, cardiac and neuro clearance  Patient denies shortness of breath, fever, cough and chest pain at PAT appointment   All instructions explained to the patient, with a verbal understanding of the material. Patient agrees to go over the instructions while at home for a better understanding. Patient also instructed to self quarantine after being tested for COVID-19. The opportunity to ask questions was provided.

## 2024-04-27 LAB — HEMOGLOBIN A1C
Hgb A1c MFr Bld: 10.3 % — ABNORMAL HIGH (ref 4.8–5.6)
Mean Plasma Glucose: 248.91 mg/dL

## 2024-04-27 NOTE — Anesthesia Preprocedure Evaluation (Addendum)
 Anesthesia Evaluation  Patient identified by MRN, date of birth, ID band Patient awake    Reviewed: Allergy & Precautions, NPO status , Patient's Chart, lab work & pertinent test results  History of Anesthesia Complications Negative for: history of anesthetic complications  Airway Mallampati: III  TM Distance: >3 FB Neck ROM: Full   Comment: Previous grade I view with MAC 3 Dental  (+) Dental Advisory Given, Poor Dentition, Missing   Pulmonary neg shortness of breath, neg sleep apnea, neg COPD, neg recent URI, Current Smoker and Patient abstained from smoking.   Pulmonary exam normal breath sounds clear to auscultation       Cardiovascular hypertension (carvedilol , losartan , torsemide ), Pt. on home beta blockers and Pt. on medications (-) angina + CAD, + Past MI, + CABG (09/22/2016) and + Peripheral Vascular Disease (s/p right iliac artery stenting)  (-) dysrhythmias  Rhythm:Regular Rate:Normal  HLD, h/o postpartum cardiomyopathy 2012  TTE 01/05/2024: EF 50-55%, moderate MR, moderate dilation of LV and LA, LVH   Neuro/Psych neg Seizures PSYCHIATRIC DISORDERS Anxiety Depression    CVA (12/2023, right-sided weakness), Residual Symptoms    GI/Hepatic negative GI ROS, Neg liver ROS,,,  Endo/Other  diabetes, Type 2, Oral Hypoglycemic Agents    Renal/GU Renal disease     Musculoskeletal   Abdominal  (+) + obese  Peds  Hematology negative hematology ROS (+) Lab Results      Component                Value               Date                      WBC                      8.2                 04/26/2024                HGB                      17.6 (H)            04/26/2024                HCT                      54.1 (H)            04/26/2024                MCV                      83.6                04/26/2024                PLT                      155                 04/26/2024              Anesthesia Other Findings Last  Plavix : this morning  Last Ozempic: 04/22/2024  Reproductive/Obstetrics PCOS  Anesthesia Physical Anesthesia Plan  ASA: 3  Anesthesia Plan: General   Post-op Pain Management: Tylenol  PO (pre-op)*   Induction: Intravenous  PONV Risk Score and Plan: 2 and Ondansetron , Dexamethasone  and Treatment may vary due to age or medical condition  Airway Management Planned: Oral ETT  Additional Equipment: Arterial line  Intra-op Plan:   Post-operative Plan: Extubation in OR  Informed Consent: I have reviewed the patients History and Physical, chart, labs and discussed the procedure including the risks, benefits and alternatives for the proposed anesthesia with the patient or authorized representative who has indicated his/her understanding and acceptance.     Dental advisory given  Plan Discussed with: CRNA and Anesthesiologist  Anesthesia Plan Comments: (Risks of general anesthesia discussed including, but not limited to, sore throat, hoarse voice, chipped/damaged teeth, injury to vocal cords, nausea and vomiting, allergic reactions, lung infection, heart attack, stroke, and death. All questions answered.   PAT note by Rudy Costain, PA-C: 48 yo female follows with cardiology for history of HTN, ischemic cardiomyopathy, HLD, CAD s/p CABG x2 2017(LIMA to LAD and SVG to RCA).  She was admitted in 2021 with NSTEMI, cath showed patent LIMA to LAD, occluded SVG to RCA with collaterals, medical management was recommended.  She gets occasional nitroglycerin  responsive chest pain, cardiology is aware.  Nuclear stress test 09/2023 showed prior infarct, negative for current ischemia, EF 39%, intermediate risk.  Echo 12/2023 showed EF 50 to 55%, moderate mitral regurgitation.  She was last seen by Dr. Katheryne Pane on 04/10/2024 and at that time was referred to vascular surgery for consideration of endarterectomy.  She was admitted to [facility] in  January 2025 after presenting with right-sided weakness and numbness and was found to have acute left thalamocapsular stroke.  She had outpatient follow-up with neurologist Dr. Samara Crest on 04/17/2024.  Per note, stroke etiology felt to be likely small vessel disease.  CTA of the head and neck 01/04/2024 showed no significant stenosis or large vessel occlusion.  Patient continues to report some right sided weakness and issues with dexterity in her hands.  She was recommended continue current medications including aspirin  and Plavix  and she was referred to physical and Occupational Therapy.  1 year follow-up recommended.  History of PAD s/p right iliac artery stenting.  Recent imaging demonstrated left-sided common femoral artery occlusion with reconstitution of the profunda and superficial artery distally.  She has lifestyle limiting claudication on the left.  Seen by Dr. Rosalva Comber who recommended endarterectomy.  She was also noted to have similarly depressed ABI in her RLE but she did not report significant symptoms in that leg.  Other pertinent history includes current smoker, uncontrolled IDDM 2.  Preop labs reviewed, glucose elevated 219, A1c 10.3, otherwise unremarkable.  EKG 11/11/2023: Sinus rhythm.  Rate 91.  PACs.  Possible LVH.  Possible septal infarct.  Moderate high lateral and lateral repolarization disturbances, consider ischemia or LV overload.  CT angiogram head and neck 01/04/2024 Mild atherosclerotic changes with no significant stenosis, large vessel occlusion or aneurysmal dilatation.  Small early subacute infarct in the left thalamocapsular region. Incidental finding of subcentimeter noted in the right superficial parotid and calcified thyroid nodule  TTE 01/05/2024: Conclusions: Left ventricular ejection fraction is 50 to 55%. There is concentric hypertrophy of the left ventricular wall. Left ventricular chamber size is moderately dilated. The left atrium is moderately dilated. There  is moderate mitral regurgitation. A saline bubble study was performed. It was unremarkable.  Nuclear stress test 10/01/2023: Impression:  1.  Severe moderate size fixed defect along the anterior apical wall with left ventricular chamber dilation.  Overall appearance is progressive from previous but this has fixed.  No definite reversible change. 2.  Hypokinesis particularly along the anterior wall.  Left ventricular chamber dilation. 3.  Left ventricular ejection fraction 39%.  Previous calculated 46%. 4.  Noninvasive restratification: Intermediate.  Cath 06/07/2020:  Ost LAD to Prox LAD lesion is 100% stenosed.  2nd Mrg lesion is 70% stenosed.  Prox RCA to Mid RCA lesion is 100% stenosed.  SVG graft was visualized by angiography.  Origin to Prox Graft lesion is 100% stenosed.  LIMA graft was visualized by angiography and is large.  The graft exhibits no disease.  LV end diastolic pressure is mildly elevated.   1. 3 vessel obstructive CAD    - 100% proximal LAD    - 70% mid to distal OM2    - 100% proximal RCA 2. Occluded SVG to RCA 3. Patent LIMA to LAD 4. Mildly elevated LVEDP  Plan: optimal medical therapy and risk factor modification. No targets for PCI. RCA does have some collaterals.   )        Anesthesia Quick Evaluation

## 2024-04-27 NOTE — Progress Notes (Signed)
 Anesthesia Chart Review:  48 yo female follows with cardiology for history of HTN, ischemic cardiomyopathy, HLD, CAD s/p CABG x2 2017(LIMA to LAD and SVG to RCA).  She was admitted in 2021 with NSTEMI, cath showed patent LIMA to LAD, occluded SVG to RCA with collaterals, medical management was recommended.  She gets occasional nitroglycerin  responsive chest pain, cardiology is aware.  Nuclear stress test 09/2023 showed prior infarct, negative for current ischemia, EF 39%, intermediate risk.  Echo 12/2023 showed EF 50 to 55%, moderate mitral regurgitation.  She was last seen by Dr. Katheryne Pane on 04/10/2024 and at that time was referred to vascular surgery for consideration of endarterectomy.  She was admitted to [facility] in January 2025 after presenting with right-sided weakness and numbness and was found to have acute left thalamocapsular stroke.  She had outpatient follow-up with neurologist Dr. Samara Crest on 04/17/2024.  Per note, stroke etiology felt to be likely small vessel disease.  CTA of the head and neck 01/04/2024 showed no significant stenosis or large vessel occlusion.  Patient continues to report some right sided weakness and issues with dexterity in her hands.  She was recommended continue current medications including aspirin  and Plavix  and she was referred to physical and Occupational Therapy.  1 year follow-up recommended.  History of PAD s/p right iliac artery stenting.  Recent imaging demonstrated left-sided common femoral artery occlusion with reconstitution of the profunda and superficial artery distally.  She has lifestyle limiting claudication on the left.  Seen by Dr. Rosalva Comber who recommended endarterectomy.  She was also noted to have similarly depressed ABI in her RLE but she did not report significant symptoms in that leg.  Other pertinent history includes current smoker, uncontrolled IDDM 2.  Preop labs reviewed, glucose elevated 219, A1c 10.3, otherwise unremarkable.  EKG  11/11/2023: Sinus rhythm.  Rate 91.  PACs.  Possible LVH.  Possible septal infarct.  Moderate high lateral and lateral repolarization disturbances, consider ischemia or LV overload.  CT angiogram head and neck 01/04/2024 Mild atherosclerotic changes with no significant stenosis, large vessel occlusion or aneurysmal dilatation.  Small early subacute infarct in the left thalamocapsular region. Incidental finding of subcentimeter noted in the right superficial parotid and calcified thyroid nodule  TTE 01/05/2024: Conclusions: Left ventricular ejection fraction is 50 to 55%. There is concentric hypertrophy of the left ventricular wall. Left ventricular chamber size is moderately dilated. The left atrium is moderately dilated. There is moderate mitral regurgitation. A saline bubble study was performed. It was unremarkable.  Nuclear stress test 10/01/2023: Impression:  1.  Severe moderate size fixed defect along the anterior apical wall with left ventricular chamber dilation.  Overall appearance is progressive from previous but this has fixed.  No definite reversible change. 2.  Hypokinesis particularly along the anterior wall.  Left ventricular chamber dilation. 3.  Left ventricular ejection fraction 39%.  Previous calculated 46%. 4.  Noninvasive restratification: Intermediate.  Cath 06/07/2020: Ost LAD to Prox LAD lesion is 100% stenosed. 2nd Mrg lesion is 70% stenosed. Prox RCA to Mid RCA lesion is 100% stenosed. SVG graft was visualized by angiography. Origin to Prox Graft lesion is 100% stenosed. LIMA graft was visualized by angiography and is large. The graft exhibits no disease. LV end diastolic pressure is mildly elevated.   1. 3 vessel obstructive CAD    - 100% proximal LAD    - 70% mid to distal OM2    - 100% proximal RCA 2. Occluded SVG to RCA 3. Patent LIMA to  LAD 4. Mildly elevated LVEDP   Plan: optimal medical therapy and risk factor modification. No targets for PCI. RCA  does have some collaterals.     Edilia Gordon [facility] Short Stay Center/Anesthesiology Phone [phone] 04/27/2024 1:56 PM

## 2024-05-02 DIAGNOSIS — G5622 Lesion of ulnar nerve, left upper limb: Secondary | ICD-10-CM

## 2024-05-02 DIAGNOSIS — G629 Polyneuropathy, unspecified: Secondary | ICD-10-CM

## 2024-05-02 HISTORY — DX: Polyneuropathy, unspecified: G62.9

## 2024-05-02 HISTORY — DX: Lesion of ulnar nerve, left upper limb: G56.22

## 2024-05-02 NOTE — Telephone Encounter (Signed)
 Received a call from [facility]. Decline referral due to is not in patient's insurance network. Refax referral for occupational therapy to [facility]. Phone: [phone], Fax: [phone]

## 2024-05-02 NOTE — Telephone Encounter (Signed)
 Received a call from [facility] Physical [facility]. Decline referral due to is not in patient's insurance network. Refax referral for physical therapy to [facility]. Phone: [phone], Fax: [phone]

## 2024-05-04 NOTE — Progress Notes (Signed)
 Patient called and updated on surgical time change. Patient to arrive at 1010 on 05/05/2024.

## 2024-05-05 ENCOUNTER — Other Ambulatory Visit: Payer: Self-pay

## 2024-05-05 ENCOUNTER — Inpatient Hospital Stay (HOSPITAL_COMMUNITY): Payer: Self-pay | Admitting: Physician Assistant

## 2024-05-05 ENCOUNTER — Encounter (HOSPITAL_COMMUNITY): Payer: Self-pay | Admitting: Vascular Surgery

## 2024-05-05 ENCOUNTER — Encounter (HOSPITAL_COMMUNITY)
Admission: RE | Disposition: A | Discharge status: Home / Self Care | Payer: Self-pay | Source: Home / Self Care | Attending: Vascular Surgery

## 2024-05-05 ENCOUNTER — Inpatient Hospital Stay (HOSPITAL_COMMUNITY)
Admission: RE | Admit: 2024-05-05 | Discharge: 2024-05-07 | DRG: 272 | Disposition: A | Discharge status: Home / Self Care | Attending: Vascular Surgery | Admitting: Vascular Surgery

## 2024-05-05 DIAGNOSIS — E782 Mixed hyperlipidemia: Secondary | ICD-10-CM | POA: Diagnosis present

## 2024-05-05 DIAGNOSIS — F17210 Nicotine dependence, cigarettes, uncomplicated: Secondary | ICD-10-CM | POA: Diagnosis present

## 2024-05-05 DIAGNOSIS — Z951 Presence of aortocoronary bypass graft: Secondary | ICD-10-CM

## 2024-05-05 DIAGNOSIS — Z9889 Other specified postprocedural states: Secondary | ICD-10-CM

## 2024-05-05 DIAGNOSIS — I252 Old myocardial infarction: Secondary | ICD-10-CM | POA: Diagnosis not present

## 2024-05-05 DIAGNOSIS — I70222 Atherosclerosis of native arteries of extremities with rest pain, left leg: Secondary | ICD-10-CM

## 2024-05-05 DIAGNOSIS — E1151 Type 2 diabetes mellitus with diabetic peripheral angiopathy without gangrene: Principal | ICD-10-CM | POA: Diagnosis present

## 2024-05-05 DIAGNOSIS — Z833 Family history of diabetes mellitus: Secondary | ICD-10-CM | POA: Diagnosis not present

## 2024-05-05 DIAGNOSIS — Z794 Long term (current) use of insulin: Secondary | ICD-10-CM | POA: Diagnosis not present

## 2024-05-05 DIAGNOSIS — F32A Depression, unspecified: Secondary | ICD-10-CM | POA: Diagnosis present

## 2024-05-05 DIAGNOSIS — Z6831 Body mass index (BMI) 31.0-31.9, adult: Secondary | ICD-10-CM

## 2024-05-05 DIAGNOSIS — E1165 Type 2 diabetes mellitus with hyperglycemia: Principal | ICD-10-CM

## 2024-05-05 DIAGNOSIS — Z8249 Family history of ischemic heart disease and other diseases of the circulatory system: Secondary | ICD-10-CM

## 2024-05-05 DIAGNOSIS — Z8619 Personal history of other infectious and parasitic diseases: Secondary | ICD-10-CM

## 2024-05-05 DIAGNOSIS — I2510 Atherosclerotic heart disease of native coronary artery without angina pectoris: Secondary | ICD-10-CM

## 2024-05-05 DIAGNOSIS — Z8616 Personal history of COVID-19: Secondary | ICD-10-CM

## 2024-05-05 DIAGNOSIS — F419 Anxiety disorder, unspecified: Secondary | ICD-10-CM | POA: Diagnosis present

## 2024-05-05 DIAGNOSIS — E669 Obesity, unspecified: Secondary | ICD-10-CM | POA: Diagnosis present

## 2024-05-05 DIAGNOSIS — Z90721 Acquired absence of ovaries, unilateral: Secondary | ICD-10-CM

## 2024-05-05 DIAGNOSIS — I10 Essential (primary) hypertension: Secondary | ICD-10-CM | POA: Diagnosis present

## 2024-05-05 DIAGNOSIS — Z7982 Long term (current) use of aspirin: Secondary | ICD-10-CM

## 2024-05-05 DIAGNOSIS — Z8673 Personal history of transient ischemic attack (TIA), and cerebral infarction without residual deficits: Secondary | ICD-10-CM | POA: Diagnosis not present

## 2024-05-05 DIAGNOSIS — Z9079 Acquired absence of other genital organ(s): Secondary | ICD-10-CM | POA: Diagnosis not present

## 2024-05-05 DIAGNOSIS — I70229 Atherosclerosis of native arteries of extremities with rest pain, unspecified extremity: Secondary | ICD-10-CM | POA: Diagnosis present

## 2024-05-05 HISTORY — PX: PATCH ANGIOPLASTY: SHX6230

## 2024-05-05 HISTORY — DX: Atherosclerosis of native arteries of extremities with rest pain, unspecified extremity: I70.229

## 2024-05-05 HISTORY — PX: ENDARTERECTOMY FEMORAL: SHX5804

## 2024-05-05 LAB — CBC
HCT: 46 % (ref 36.0–46.0)
Hemoglobin: 15 g/dL (ref 12.0–15.0)
MCH: 27.5 pg (ref 26.0–34.0)
MCHC: 32.6 g/dL (ref 30.0–36.0)
MCV: 84.4 fL (ref 80.0–100.0)
Platelets: 137 10*3/uL — ABNORMAL LOW (ref 150–400)
RBC: 5.45 MIL/uL — ABNORMAL HIGH (ref 3.87–5.11)
RDW: 15 % (ref 11.5–15.5)
WBC: 11 10*3/uL — ABNORMAL HIGH (ref 4.0–10.5)
nRBC: 0 % (ref 0.0–0.2)

## 2024-05-05 LAB — GLUCOSE, CAPILLARY
Glucose-Capillary: 218 mg/dL — ABNORMAL HIGH (ref 70–99)
Glucose-Capillary: 119 mg/dL — ABNORMAL HIGH (ref 70–99)
Glucose-Capillary: 88 mg/dL (ref 70–99)
Glucose-Capillary: 137 mg/dL — ABNORMAL HIGH (ref 70–99)

## 2024-05-05 LAB — CREATININE, SERUM
Creatinine, Ser: 0.79 mg/dL (ref 0.44–1.00)
GFR, Estimated: >60 mL/min (ref >60)

## 2024-05-05 LAB — POCT PREGNANCY, URINE: Preg Test, Ur: NEGATIVE

## 2024-05-05 SURGERY — ENDARTERECTOMY, FEMORAL
Anesthesia: General | Site: Groin | Laterality: Left

## 2024-05-05 MED ORDER — CLOPIDOGREL BISULFATE 75 MG PO TABS
75.0000 mg | ORAL_TABLET | Freq: Every day | ORAL | Status: DC
  Administered 2024-05-06 – 2024-05-07 (×2): 75 mg via ORAL
  Filled 2024-05-05 (×2): qty 1

## 2024-05-05 MED ORDER — HYDROMORPHONE HCL 1 MG/ML IJ SOLN: 0.5000 mg | INTRAMUSCULAR | Status: DC | PRN

## 2024-05-05 MED ORDER — INSULIN ASPART 100 UNIT/ML IJ SOLN
INTRAMUSCULAR | Status: AC
  Administered 2024-05-05: 4 [IU] via SUBCUTANEOUS
  Filled 2024-05-05: qty 1

## 2024-05-05 MED ORDER — FENTANYL CITRATE (PF) 250 MCG/5ML IJ SOLN
INTRAMUSCULAR | Status: AC
  Filled 2024-05-05: qty 5

## 2024-05-05 MED ORDER — CARVEDILOL 25 MG PO TABS
25.0000 mg | ORAL_TABLET | Freq: Every day | ORAL | Status: DC
  Administered 2024-05-06 – 2024-05-07 (×2): 25 mg via ORAL
  Filled 2024-05-05 (×2): qty 1

## 2024-05-05 MED ORDER — ACETAMINOPHEN 325 MG PO TABS
325.0000 mg | ORAL_TABLET | ORAL | Status: DC | PRN
  Administered 2024-05-05: 650 mg via ORAL
  Filled 2024-05-05: qty 2

## 2024-05-05 MED ORDER — ALBUMIN HUMAN 5 % IV SOLN: INTRAVENOUS | Status: DC | PRN

## 2024-05-05 MED ORDER — CEFAZOLIN SODIUM-DEXTROSE 2-4 GM/100ML-% IV SOLN
INTRAVENOUS | Status: AC
Start: 2024-05-05 — End: 2024-05-05
  Filled 2024-05-05: qty 100

## 2024-05-05 MED ORDER — EPHEDRINE SULFATE-NACL 50-0.9 MG/10ML-% IV SOSY
PREFILLED_SYRINGE | INTRAVENOUS | Status: DC | PRN
  Administered 2024-05-05: 5 mg via INTRAVENOUS
  Administered 2024-05-05: 2.5 mg via INTRAVENOUS

## 2024-05-05 MED ORDER — ACETAMINOPHEN 500 MG PO TABS: 1000.0000 mg | ORAL_TABLET | Freq: Once | ORAL | Status: AC

## 2024-05-05 MED ORDER — METFORMIN HCL ER 500 MG PO TB24
1000.0000 mg | ORAL_TABLET | Freq: Two times a day (BID) | ORAL | Status: DC
  Administered 2024-05-05 – 2024-05-07 (×4): 1000 mg via ORAL
  Filled 2024-05-05 (×4): qty 2

## 2024-05-05 MED ORDER — SODIUM CHLORIDE 0.9 % IV SOLN: INTRAVENOUS | Status: DC

## 2024-05-05 MED ORDER — FLEET ENEMA RE ENEM: 1.0000 | ENEMA | Freq: Once | RECTAL | Status: DC | PRN

## 2024-05-05 MED ORDER — HEPARIN 6000 UNIT IRRIGATION SOLUTION
Status: DC | PRN
  Administered 2024-05-05: 1

## 2024-05-05 MED ORDER — SUGAMMADEX SODIUM 200 MG/2ML IV SOLN
INTRAVENOUS | Status: DC | PRN
  Administered 2024-05-05 (×2): 200 mg via INTRAVENOUS

## 2024-05-05 MED ORDER — INSULIN ASPART 100 UNIT/ML IJ SOLN: 0.0000 [IU] | INTRAMUSCULAR | Status: DC | PRN

## 2024-05-05 MED ORDER — INSULIN ASPART 100 UNIT/ML IJ SOLN
0.0000 [IU] | Freq: Three times a day (TID) | INTRAMUSCULAR | Status: DC
  Administered 2024-05-06: 3 [IU] via SUBCUTANEOUS
  Administered 2024-05-06: 2 [IU] via SUBCUTANEOUS
  Administered 2024-05-06: 3 [IU] via SUBCUTANEOUS

## 2024-05-05 MED ORDER — LACTATED RINGERS IV SOLN: INTRAVENOUS | Status: DC | PRN

## 2024-05-05 MED ORDER — SURGIFLO WITH THROMBIN (HEMOSTATIC MATRIX KIT) OPTIME
TOPICAL | Status: DC | PRN
  Administered 2024-05-05: 1 via TOPICAL

## 2024-05-05 MED ORDER — SENNOSIDES-DOCUSATE SODIUM 8.6-50 MG PO TABS: 1.0000 | ORAL_TABLET | Freq: Every evening | ORAL | Status: DC | PRN

## 2024-05-05 MED ORDER — PANTOPRAZOLE SODIUM 40 MG PO TBEC
40.0000 mg | DELAYED_RELEASE_TABLET | Freq: Every day | ORAL | Status: DC
  Administered 2024-05-05 – 2024-05-07 (×3): 40 mg via ORAL
  Filled 2024-05-05 (×3): qty 1

## 2024-05-05 MED ORDER — 0.9 % SODIUM CHLORIDE (POUR BTL) OPTIME
TOPICAL | Status: DC | PRN
  Administered 2024-05-05: 2000 mL

## 2024-05-05 MED ORDER — PROPOFOL 10 MG/ML IV BOLUS
INTRAVENOUS | Status: DC | PRN
  Administered 2024-05-05: 30 mg via INTRAVENOUS
  Administered 2024-05-05: 150 mg via INTRAVENOUS

## 2024-05-05 MED ORDER — ONDANSETRON HCL 4 MG/2ML IJ SOLN
INTRAMUSCULAR | Status: DC | PRN
Start: 2024-05-05 — End: 2024-05-05
  Administered 2024-05-05: 4 mg via INTRAVENOUS

## 2024-05-05 MED ORDER — CHLORHEXIDINE GLUCONATE 0.12 % MT SOLN
OROMUCOSAL | Status: AC
Start: 2024-05-05 — End: 2024-05-05
  Administered 2024-05-05: 15 mL
  Filled 2024-05-05: qty 15

## 2024-05-05 MED ORDER — MAGNESIUM SULFATE 2 GM/50ML IV SOLN: 2.0000 g | Freq: Every day | INTRAVENOUS | Status: DC | PRN

## 2024-05-05 MED ORDER — MIDAZOLAM HCL 2 MG/2ML IJ SOLN
INTRAMUSCULAR | Status: DC | PRN
  Administered 2024-05-05: 2 mg via INTRAVENOUS

## 2024-05-05 MED ORDER — CEFAZOLIN SODIUM-DEXTROSE 2-4 GM/100ML-% IV SOLN
2.0000 g | Freq: Three times a day (TID) | INTRAVENOUS | Status: AC
  Administered 2024-05-05 – 2024-05-06 (×2): 2 g via INTRAVENOUS
  Filled 2024-05-05 (×2): qty 100

## 2024-05-05 MED ORDER — NICOTINE 21 MG/24HR TD PT24
21.0000 mg | MEDICATED_PATCH | Freq: Every day | TRANSDERMAL | Status: DC
  Administered 2024-05-05 – 2024-05-07 (×3): 21 mg via TRANSDERMAL
  Filled 2024-05-05 (×3): qty 1

## 2024-05-05 MED ORDER — OXYCODONE HCL 5 MG PO TABS: 5.0000 mg | ORAL_TABLET | Freq: Once | ORAL | Status: DC | PRN

## 2024-05-05 MED ORDER — ROCURONIUM BROMIDE 10 MG/ML (PF) SYRINGE
PREFILLED_SYRINGE | INTRAVENOUS | Status: DC | PRN
  Administered 2024-05-05 (×2): 50 mg via INTRAVENOUS
  Administered 2024-05-05: 20 mg via INTRAVENOUS

## 2024-05-05 MED ORDER — PHENYLEPHRINE HCL (PRESSORS) 10 MG/ML IV SOLN
INTRAVENOUS | Status: DC | PRN
  Administered 2024-05-05 (×2): 80 ug via INTRAVENOUS
  Administered 2024-05-05: 40 ug via INTRAVENOUS
  Administered 2024-05-05: 80 ug via INTRAVENOUS

## 2024-05-05 MED ORDER — LIDOCAINE 2% (20 MG/ML) 5 ML SYRINGE
INTRAMUSCULAR | Status: AC
  Filled 2024-05-05: qty 5

## 2024-05-05 MED ORDER — DOCUSATE SODIUM 100 MG PO CAPS
100.0000 mg | ORAL_CAPSULE | Freq: Every day | ORAL | Status: DC
  Administered 2024-05-07: 100 mg via ORAL
  Filled 2024-05-05 (×2): qty 1

## 2024-05-05 MED ORDER — BISACODYL 5 MG PO TBEC: 5.0000 mg | DELAYED_RELEASE_TABLET | Freq: Every day | ORAL | Status: DC | PRN

## 2024-05-05 MED ORDER — INSULIN ASPART 100 UNIT/ML IJ SOLN
0.0000 [IU] | Freq: Every day | INTRAMUSCULAR | Status: DC
Start: 2024-05-05 — End: 2024-05-07

## 2024-05-05 MED ORDER — ALUM & MAG HYDROXIDE-SIMETH 200-200-20 MG/5ML PO SUSP: 15.0000 mL | ORAL | Status: DC | PRN

## 2024-05-05 MED ORDER — METOPROLOL TARTRATE 5 MG/5ML IV SOLN: 2.0000 mg | INTRAVENOUS | Status: DC | PRN

## 2024-05-05 MED ORDER — EMPAGLIFLOZIN 25 MG PO TABS
25.0000 mg | ORAL_TABLET | Freq: Every day | ORAL | Status: DC
  Administered 2024-05-05 – 2024-05-07 (×3): 25 mg via ORAL
  Filled 2024-05-05 (×3): qty 1

## 2024-05-05 MED ORDER — ONDANSETRON HCL 4 MG/2ML IJ SOLN: 4.0000 mg | Freq: Four times a day (QID) | INTRAMUSCULAR | Status: DC | PRN

## 2024-05-05 MED ORDER — GUAIFENESIN-DM 100-10 MG/5ML PO SYRP: 15.0000 mL | ORAL_SOLUTION | ORAL | Status: DC | PRN

## 2024-05-05 MED ORDER — ZOLPIDEM TARTRATE 5 MG PO TABS: 5.0000 mg | ORAL_TABLET | Freq: Every evening | ORAL | Status: DC | PRN

## 2024-05-05 MED ORDER — PHENOL 1.4 % MT LIQD: 1.0000 | OROMUCOSAL | Status: DC | PRN

## 2024-05-05 MED ORDER — PROTAMINE SULFATE 10 MG/ML IV SOLN
INTRAVENOUS | Status: DC | PRN
  Administered 2024-05-05: 30 mg via INTRAVENOUS

## 2024-05-05 MED ORDER — HEMOSTATIC AGENTS (NO CHARGE) OPTIME
TOPICAL | Status: DC | PRN
  Administered 2024-05-05: 2 via TOPICAL

## 2024-05-05 MED ORDER — HEPARIN 6000 UNIT IRRIGATION SOLUTION
Status: AC
  Filled 2024-05-05: qty 500

## 2024-05-05 MED ORDER — LOSARTAN POTASSIUM 50 MG PO TABS
50.0000 mg | ORAL_TABLET | Freq: Every day | ORAL | Status: DC
Start: 2024-05-05 — End: 2024-05-07
  Administered 2024-05-05 – 2024-05-07 (×3): 50 mg via ORAL
  Filled 2024-05-05 (×3): qty 1

## 2024-05-05 MED ORDER — TORSEMIDE 20 MG PO TABS
20.0000 mg | ORAL_TABLET | Freq: Every day | ORAL | Status: DC
  Administered 2024-05-05 – 2024-05-07 (×3): 20 mg via ORAL
  Filled 2024-05-05 (×3): qty 1

## 2024-05-05 MED ORDER — FENTANYL CITRATE (PF) 100 MCG/2ML IJ SOLN: 25.0000 ug | INTRAMUSCULAR | Status: DC | PRN

## 2024-05-05 MED ORDER — ACETAMINOPHEN 500 MG PO TABS
ORAL_TABLET | ORAL | Status: AC
Start: 2024-05-05 — End: 2024-05-05
  Administered 2024-05-05: 1000 mg via ORAL
  Filled 2024-05-05: qty 2

## 2024-05-05 MED ORDER — GABAPENTIN 100 MG PO CAPS
100.0000 mg | ORAL_CAPSULE | Freq: Three times a day (TID) | ORAL | Status: DC
  Administered 2024-05-05 – 2024-05-07 (×6): 100 mg via ORAL
  Filled 2024-05-05 (×6): qty 1

## 2024-05-05 MED ORDER — LABETALOL HCL 5 MG/ML IV SOLN: 10.0000 mg | INTRAVENOUS | Status: DC | PRN

## 2024-05-05 MED ORDER — AMISULPRIDE (ANTIEMETIC) 5 MG/2ML IV SOLN: 10.0000 mg | Freq: Once | INTRAVENOUS | Status: DC | PRN

## 2024-05-05 MED ORDER — CEFAZOLIN SODIUM-DEXTROSE 2-4 GM/100ML-% IV SOLN
2.0000 g | INTRAVENOUS | Status: AC
  Administered 2024-05-05: 2 g via INTRAVENOUS

## 2024-05-05 MED ORDER — SODIUM CHLORIDE 0.9 % IV SOLN: 500.0000 mL | Freq: Once | INTRAVENOUS | Status: DC | PRN

## 2024-05-05 MED ORDER — OXYCODONE HCL 5 MG/5ML PO SOLN: 5.0000 mg | Freq: Once | ORAL | Status: DC | PRN

## 2024-05-05 MED ORDER — HYDRALAZINE HCL 20 MG/ML IJ SOLN: 5.0000 mg | INTRAMUSCULAR | Status: DC | PRN

## 2024-05-05 MED ORDER — GLIPIZIDE ER 10 MG PO TB24
10.0000 mg | ORAL_TABLET | Freq: Every day | ORAL | Status: DC
  Administered 2024-05-05 – 2024-05-07 (×3): 10 mg via ORAL
  Filled 2024-05-05 (×3): qty 1

## 2024-05-05 MED ORDER — POTASSIUM CHLORIDE CRYS ER 20 MEQ PO TBCR: 20.0000 meq | EXTENDED_RELEASE_TABLET | Freq: Every day | ORAL | Status: DC | PRN

## 2024-05-05 MED ORDER — MIDAZOLAM HCL 2 MG/2ML IJ SOLN
INTRAMUSCULAR | Status: AC
  Filled 2024-05-05: qty 2

## 2024-05-05 MED ORDER — CHLORHEXIDINE GLUCONATE CLOTH 2 % EX PADS: 6.0000 | MEDICATED_PAD | Freq: Once | CUTANEOUS | Status: DC

## 2024-05-05 MED ORDER — PROTAMINE SULFATE 10 MG/ML IV SOLN
INTRAVENOUS | Status: AC
  Filled 2024-05-05: qty 5

## 2024-05-05 MED ORDER — OXYCODONE HCL 5 MG PO TABS
5.0000 mg | ORAL_TABLET | ORAL | Status: DC | PRN
  Administered 2024-05-05: 5 mg via ORAL
  Administered 2024-05-06 – 2024-05-07 (×5): 10 mg via ORAL
  Filled 2024-05-05 (×4): qty 2
  Filled 2024-05-05: qty 1
  Filled 2024-05-05: qty 2

## 2024-05-05 MED ORDER — LIDOCAINE 2% (20 MG/ML) 5 ML SYRINGE
INTRAMUSCULAR | Status: DC | PRN
  Administered 2024-05-05: 100 mg via INTRAVENOUS

## 2024-05-05 MED ORDER — ASPIRIN 81 MG PO TBEC
81.0000 mg | DELAYED_RELEASE_TABLET | Freq: Every day | ORAL | Status: DC
Start: 2024-05-06 — End: 2024-05-07
  Administered 2024-05-06 – 2024-05-07 (×2): 81 mg via ORAL
  Filled 2024-05-05 (×2): qty 1

## 2024-05-05 MED ORDER — PROPOFOL 500 MG/50ML IV EMUL
INTRAVENOUS | Status: DC | PRN
  Administered 2024-05-05: 100 ug/kg/min via INTRAVENOUS

## 2024-05-05 MED ORDER — KETAMINE HCL 10 MG/ML IJ SOLN
INTRAMUSCULAR | Status: DC | PRN
  Administered 2024-05-05: 30 mg via INTRAVENOUS
  Administered 2024-05-05: 20 mg via INTRAVENOUS

## 2024-05-05 MED ORDER — PHENYLEPHRINE HCL-NACL 20-0.9 MG/250ML-% IV SOLN
INTRAVENOUS | Status: DC | PRN
  Administered 2024-05-05: 20 ug/min via INTRAVENOUS

## 2024-05-05 MED ORDER — KETAMINE HCL 50 MG/5ML IJ SOSY
PREFILLED_SYRINGE | INTRAMUSCULAR | Status: AC
  Filled 2024-05-05: qty 5

## 2024-05-05 MED ORDER — HEPARIN SODIUM (PORCINE) 1000 UNIT/ML IJ SOLN
INTRAMUSCULAR | Status: AC
  Filled 2024-05-05: qty 1

## 2024-05-05 MED ORDER — HEPARIN SODIUM (PORCINE) 1000 UNIT/ML IJ SOLN
INTRAMUSCULAR | Status: DC | PRN
Start: 2024-05-05 — End: 2024-05-05
  Administered 2024-05-05: 9000 [IU] via INTRAVENOUS

## 2024-05-05 MED ORDER — ACETAMINOPHEN 650 MG RE SUPP: 325.0000 mg | RECTAL | Status: DC | PRN

## 2024-05-05 MED ORDER — FENTANYL CITRATE (PF) 250 MCG/5ML IJ SOLN
INTRAMUSCULAR | Status: DC | PRN
  Administered 2024-05-05 (×3): 50 ug via INTRAVENOUS

## 2024-05-05 MED ORDER — DEXAMETHASONE SODIUM PHOSPHATE 10 MG/ML IJ SOLN
INTRAMUSCULAR | Status: DC | PRN
  Administered 2024-05-05: 10 mg via INTRAVENOUS

## 2024-05-05 MED ORDER — ATORVASTATIN CALCIUM 80 MG PO TABS
80.0000 mg | ORAL_TABLET | Freq: Every day | ORAL | Status: DC
  Administered 2024-05-06 – 2024-05-07 (×2): 80 mg via ORAL
  Filled 2024-05-05 (×2): qty 1

## 2024-05-05 MED ORDER — HEPARIN SODIUM (PORCINE) 5000 UNIT/ML IJ SOLN
5000.0000 [IU] | Freq: Three times a day (TID) | INTRAMUSCULAR | Status: DC
  Administered 2024-05-06 – 2024-05-07 (×4): 5000 [IU] via SUBCUTANEOUS
  Filled 2024-05-05 (×4): qty 1

## 2024-05-05 SURGICAL SUPPLY — 35 items
BAG COUNTER SPONGE SURGICOUNT (BAG) ×2 IMPLANT
CANISTER SUCTION 3000ML PPV (SUCTIONS) ×2 IMPLANT
CLIP TI MEDIUM 24 (CLIP) ×2 IMPLANT
CLIP TI MEDIUM 6 (CLIP) ×2 IMPLANT
CLIP TI WIDE RED SMALL 24 (CLIP) ×2 IMPLANT
CLIP TI WIDE RED SMALL 6 (CLIP) ×2 IMPLANT
COVER SURGICAL LIGHT HANDLE (MISCELLANEOUS) IMPLANT
DERMABOND ADVANCED .7 DNX12 (GAUZE/BANDAGES/DRESSINGS) ×2 IMPLANT
DRESSING PEEL AND PLC PRVNA 13 (GAUZE/BANDAGES/DRESSINGS) IMPLANT
ELECTRODE REM PT RTRN 9FT ADLT (ELECTROSURGICAL) ×2 IMPLANT
EVACUATOR SILICONE 100CC (DRAIN) IMPLANT
GLOVE BIOGEL PI IND STRL 8 (GLOVE) ×2 IMPLANT
GOWN STRL REUS W/ TWL LRG LVL3 (GOWN DISPOSABLE) ×6 IMPLANT
GOWN STRL REUS W/TWL 2XL LVL3 (GOWN DISPOSABLE) ×4 IMPLANT
GRAFT VASC PATCH XENOSURE 1X14 (Vascular Products) IMPLANT
HEMOSTAT SNOW SURGICEL 2X4 (HEMOSTASIS) IMPLANT
KIT BASIN OR (CUSTOM PROCEDURE TRAY) ×2 IMPLANT
KIT DRSG PREVENA PLUS 7DAY 125 (MISCELLANEOUS) IMPLANT
KIT TURNOVER KIT B (KITS) ×2 IMPLANT
LOOP VESSEL MINI RED (MISCELLANEOUS) IMPLANT
NS IRRIG 1000ML POUR BTL (IV SOLUTION) ×4 IMPLANT
PACK PERIPHERAL VASCULAR (CUSTOM PROCEDURE TRAY) ×2 IMPLANT
PAD ARMBOARD POSITIONER FOAM (MISCELLANEOUS) ×4 IMPLANT
SET WALTER ACTIVATION W/DRAPE (SET/KITS/TRAYS/PACK) IMPLANT
STAPLER SKIN PROX 35W (STAPLE) IMPLANT
SURGIFLO W/THROMBIN 8M KIT (HEMOSTASIS) IMPLANT
SUT MNCRL AB 4-0 PS2 18 (SUTURE) ×2 IMPLANT
SUT PROLENE 5 0 C 1 24 (SUTURE) ×2 IMPLANT
SUT PROLENE 6 0 BV (SUTURE) IMPLANT
SUT VIC AB 2-0 CT1 TAPERPNT 27 (SUTURE) ×2 IMPLANT
SUT VIC AB 3-0 SH 27X BRD (SUTURE) ×2 IMPLANT
TOWEL GREEN STERILE (TOWEL DISPOSABLE) ×2 IMPLANT
TRAY FOLEY MTR SLVR 16FR STAT (SET/KITS/TRAYS/PACK) IMPLANT
UNDERPAD 30X36 HEAVY ABSORB (UNDERPADS AND DIAPERS) ×2 IMPLANT
WATER STERILE IRR 1000ML POUR (IV SOLUTION) ×2 IMPLANT

## 2024-05-05 NOTE — Op Note (Signed)
    NAME: [name]    MRN: [id number] DOB: [birth year]    DATE OF OPERATION: 05/05/2024  PREOP DIAGNOSIS:    Left lower extremity [place]uther[place] 4 critical ischemia with rest pain  POSTOP DIAGNOSIS:    Same  PROCEDURE:    Left iliofemoral endarterectomy with bovine pericardial patch plasty Prevena vacuum dressing  SURGEON: Kayla Part  ASSIST: Wynonia Hedges, PA  ANESTHESIA: General  EBL: 100 mL  INDICATIONS:    [name] is a 48 y.o. female well-known to my office having previously undergone left lower extremity angiogram for lifestyle-limiting claudication by my colleague Dr. Lauro Portal.  She was found to have an occluded common femoral artery with reconstitution of both the profunda and superficial femoral artery.  Her claudication progressed to rest pain.  After discussing the risks and benefits of left iliofemoral endarterectomy in an effort to provide inline flow to the left foot and alleviate rest pain, [name] elected to proceed.  FINDINGS:   Occluded common femoral artery with ruptured plaque  TECHNIQUE:   Patient is brought to the OR laid in supine position.  General anesthesia induced and patient was prepped and draped in standard fashion.  The case began with ultrasound insonation of the left common femoral artery.  The femoral bifurcation was marked.  An oblique incision was made above the inguinal crease, and carried down to the common femoral artery.  From this point, the superficial femoral artery, profunda, external iliac artery were identified, and controlled with Vesseloops.  As were multiple smaller branches.  The patient was heparinized, arteries clamped, and the common femoral artery opened through a longitudinal arteriotomy.  This was carried into the distal external iliac artery.  Healthy vessel was appreciated.  Next, the endarterectomy plane was carried into the superficial femoral artery.  The artery was completely occluded.  Endarterectomy  followed.  Proximally, a large atherosclerotic plaque was pulled from the distal extrailiac artery.  Distally, the endarterectomy plane was chased into the superficial femoral artery but continued, therefore I elected to excise what I could and tack the remaining intimal disease into the wall of the artery using 6-0 Prolene suture.  Next, long bovine pericardial patch was brought into the field and sewn in standard fashion using running 5-0 Prolene suture.  Arteries were backbled prior to completion, and Prolene then tied down.  A sterile Doppler was brought to the field and the superficial femoral artery, profunda, external iliac artery all insonated.  There were excellent signals in each.  There was a multiphasic signal in the foot.  I was happy with this result.  Heparin  was reversed.  Multiple lymph nodes were ligated using 2-0 silk suture.  The wound bed was irrigated with copious amounts of saline and closed in layers using 2-0 Vicryl suture with staples and Prevena vacuum dressing at the level of skin due to her morbid obesity.  At case completion, the patient had a palpable dorsalis pedis pulse.  Kayla Part, MD [facility] DATE OF DICTATION:   05/05/2024

## 2024-05-05 NOTE — H&P (Signed)
 Office Note   Patient seen and examined in preop holding.  No complaints. No changes to medication history or physical exam since last seen in clinic. After discussing the risks and benefits of left iliofemoral endarterectomy, [name] elected to proceed.   Kayla Part MD   CC: Occluded left common femoral artery with lifestyle liming claudication Requesting Provider:  No ref. provider found  HPI: [name] is a 48 y.o. 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  She was at her home, and I was at my office.    At her last visit, she noted strokelike symptoms.  While we discussed surgery, asked that she be cleared by cardiology, as well as see neurology.  Imaging demonstrated stroke.  She is now optimized on aspirin , Plavix , statin.    Since last seen, [name] has been doing fair.  She continues to have severe, lifestyle-limiting claudication.  The leg hurts her at night on some occasions.  She denies tissue loss.  A native of [place], she continues to live there.  Medical history is extensive at the age of 23, and includes NSTEMI-CABG, type 2 diabetes, PAD-right iliac artery stenting - [facility].  She continues to work full-time, daily smoker roughly half pack per day.     The pt is not on a statin for cholesterol management.  The pt is  on a daily aspirin .   Other AC:  - The pt is  on medication for hypertension.   The pt is  diabetic.  Tobacco hx:  current  Past Medical History:  Diagnosis Date   Abnormal stress test 08/20/2016   Formatting of this note might be different from the original. Added automatically from request for surgery [id number]   Acute appendicitis 01/28/2016   Acute appendicitis with localized peritonitis 01/28/2016   Acute non-ST segment elevation myocardial infarction (HCC) 03/17/2021   Anemia    Angina pectoris (HCC) 06/27/2020   Anxiety disorder  03/17/2021   Calculus of gallbladder 06/05/2016   Cardiomyopathy (HCC)    "postpartem cardiomyopathy"- 2012- has since been released by cardiology   Coronary artery disease 06/27/2020   COVID 10/14/2020   c/o cough and chest pressure    CVA (cerebral vascular accident) (HCC)    Depression    Dyslipidemia 06/27/2020   Dyspnea on exertion 06/27/2020   History of coronary artery bypass graft 09/22/2016   Formatting of this note might be different from the original. LIMA to LAD, SVG to PDA in September 2017 at [facility]   Hyperglycemia due to type 2 diabetes mellitus (HCC) 02/25/2016   Hypertension    Liver abscess    2012- Bradly Cage drained placed for awhile.   Long-term insulin  use (HCC) 01/26/2017   Mixed hyperlipidemia 02/25/2016   NSTEMI (non-ST elevated myocardial infarction) (HCC) 06/07/2020   Obesity 09/18/2011   PCOS (polycystic ovarian syndrome) 02/25/2016   Persistent microalbuminuria associated with type 2 diabetes mellitus (HCC) 02/25/2016   Renal failure 09/25/2011   Due to vancomycin , IV contrast, sepsis.     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  Was hospitalized, intubated, had cardiopulmonary failure.    Tobacco abuse    Type 2 diabetes mellitus with  hyperglycemia (HCC) 02/25/2016    Past Surgical History:  Procedure Laterality Date   ABDOMINAL AORTOGRAM W/LOWER EXTREMITY N/A 12/29/2021   Procedure: ABDOMINAL AORTOGRAM W/LOWER EXTREMITY;  Surgeon: Avanell Leigh, MD;  Location: [facility];  Service: Cardiovascular;  Laterality: N/A;   ABDOMINAL AORTOGRAM W/LOWER EXTREMITY N/A 12/30/2023   Procedure: ABDOMINAL AORTOGRAM W/LOWER EXTREMITY;  Surgeon: Avanell Leigh, MD;  Location: [facility];   Service: Cardiovascular;  Laterality: N/A;   APPENDECTOMY     CORONARY ARTERY BYPASS GRAFT  2017   [facility]   CRYOTHERAPY     90's- cervix   DILATION AND CURETTAGE OF UTERUS  1994   LAPAROSCOPIC APPENDECTOMY N/A 01/28/2016   Procedure: APPENDECTOMY LAPAROSCOPIC;  Surgeon: Lockie Rima, MD;  Location: [facility] OR;  Service: General;  Laterality: N/A;   LEFT HEART CATH AND CORS/GRAFTS ANGIOGRAPHY N/A 06/07/2020   Procedure: LEFT HEART CATH AND CORS/GRAFTS ANGIOGRAPHY;  Surgeon: [place], Peter M, MD;  Location: [facility];  Service: Cardiovascular;  Laterality: N/A;   PERIPHERAL VASCULAR INTERVENTION  12/29/2021   Procedure: PERIPHERAL VASCULAR INTERVENTION;  Surgeon: Avanell Leigh, MD;  Location: [facility];  Service: Cardiovascular;;  Rt. Iliac   ROBOTIC ASSISTED SALPINGO OOPHERECTOMY Right 03/24/2016   Procedure: XI ROBOTIC ASSISTED LAPAROSCOPIC RIGHT SALPINGO OOPHORECTOMY;  Surgeon: Alphonso Aschoff, MD;  Location: [facility];  Service: Gynecology;  Laterality: Right;   TONSILLECTOMY      Social History   Socioeconomic History   Marital status: Single    Spouse name: Not on file   Number of children: Not on file   Years of education: Not on file   Highest education level: Not on file  Occupational History   Not on file  Tobacco Use   Smoking status: Every Day    Current packs/day: 0.50    Average packs/day: 0.5 packs/day for 0.3 years (0.1 ttl pk-yrs)    Types: Cigarettes    Start date: 01/22/2024   Smokeless tobacco: Never  Vaping Use   Vaping status: Never Used  Substance and Sexual Activity   Alcohol use: Yes    Comment: rare social   Drug use: No    Comment: x1 episode "Meth" use.   Sexual activity: Not Currently    Birth control/protection: None  Other Topics Concern   Not on file  Social History Narrative   Not on file   Social Drivers of Health   Financial Resource Strain: Not on file  Food Insecurity: Low Risk  (04/27/2024)   Received  from [facility]   Hunger Vital Sign    Worried About Running Out of Food in the Last Year: Never true    Ran Out of Food in the Last Year: Never true  Transportation Needs: No Transportation Needs (04/27/2024)   Received from [facility]    In the past 12 months, has lack of reliable transportation kept you from medical appointments, meetings, work or from getting things needed for daily living? : No  Physical Activity: Not on file  Stress: Not on file  Social Connections: Not on file  Intimate Partner Violence: Not on file   Family History  Problem Relation Age of Onset   CAD Mother    CAD Father    Diabetes Maternal Grandmother    Diabetes Paternal Grandfather     Current Facility-Administered Medications  Medication Dose Route Frequency Provider Last Rate Last Admin   0.9 %  sodium  chloride infusion   Intravenous Continuous Kayla Part, MD       ceFAZolin (ANCEF) 2-4 GM/100ML-% IVPB            ceFAZolin (ANCEF) IVPB 2g/100 mL premix  2 g Intravenous 30 min Pre-Op Natesha Hassey E, MD       Chlorhexidine  Gluconate Cloth 2 % PADS 6 each  6 each Topical Once Harpreet Pompey E, MD       And   Chlorhexidine  Gluconate Cloth 2 % PADS 6 each  6 each Topical Once Toron Bowring E, MD       insulin  aspart (novoLOG ) injection 0-14 Units  0-14 Units Subcutaneous Q2H PRN Conard Decent, MD   4 Units at 05/05/24 0945    No Known Allergies   REVIEW OF SYSTEMS:  [X]  denotes positive finding, [ ]  denotes negative finding Cardiac  Comments:  Chest pain or chest pressure:    Shortness of breath upon exertion:    Short of breath when lying flat:    Irregular heart rhythm:        Vascular    Pain in calf, thigh, or hip brought on by ambulation:    Pain in feet at night that wakes you up from your sleep:     Blood clot in your veins:    Leg swelling:         Pulmonary    Oxygen at home:    Productive cough:     Wheezing:         Neurologic     Sudden weakness in arms or legs:     Sudden numbness in arms or legs:     Sudden onset of difficulty speaking or slurred speech:    Temporary loss of vision in one eye:     Problems with dizziness:         Gastrointestinal    Blood in stool:     Vomited blood:         Genitourinary    Burning when urinating:     Blood in urine:        Psychiatric    Major depression:         Hematologic    Bleeding problems:    Problems with blood clotting too easily:        Skin    Rashes or ulcers:        Constitutional    Fever or chills:      PHYSICAL EXAMINATION:  From her last visit** General:  WDWN in NAD; vital signs documented above, obese Gait: Not observed HENT: WNL, normocephalic Pulmonary: normal non-labored breathing , without wheezing Cardiac: regular HR Abdomen: soft, NT, no masses Skin: without rashes Vascular Exam/Pulses:  Right Left  Radial 2+ (normal) 2+ (normal)  Ulnar    Femoral  absent  Popliteal    DP nonpalpable nonpalpable  PT     Extremities: without ischemic changes, without Gangrene , without cellulitis; without open wounds;  Musculoskeletal: no muscle wasting or atrophy  Neurologic: A&O X 3;  No focal weakness or paresthesias are detected Psychiatric:  The pt has Normal affect.   Non-Invasive Vascular Imaging:    ABI Findings:  +---------+------------------+-----+----------+--------+  Right   Rt Pressure (mmHg)IndexWaveform  Comment   +---------+------------------+-----+----------+--------+  Brachial 166                                        +---------+------------------+-----+----------+--------+  PTA     121               0.73 monophasic          +---------+------------------+-----+----------+--------+  DP      115               0.69 monophasic          +---------+------------------+-----+----------+--------+  Great Toe94                0.57 Abnormal             +---------+------------------+-----+----------+--------+   +---------+------------------+-----+----------+-------+  Left    Lt Pressure (mmHg)IndexWaveform  Comment  +---------+------------------+-----+----------+-------+  Brachial 159                                       +---------+------------------+-----+----------+-------+  PTA     104               0.63 monophasic         +---------+------------------+-----+----------+-------+  DP      109               0.66 monophasic         +---------+------------------+-----+----------+-------+  Great Toe91                0.55 Abnormal           +---------+------------------+-----+----------+-------+   +-------+-----------+-----------+------------+------------+  ABI/TBIToday's ABIToday's TBIPrevious ABIPrevious TBI  +-------+-----------+-----------+------------+------------+  Right .73        .57        .69         .45           +-------+-----------+-----------+------------+------------+  Left  .66        .55        .76         .40           +-------+-----------+-----------+------------+------------+     ASSESSMENT/PLAN: [name] is a 48 y.o. female presenting with left lower extremity lifestyle-limiting claudication.  I find it interesting that her right leg does not cause more symptoms, especially as her ABIs are relatively symmetric.  Regardless, imaging demonstrates left-sided common femoral artery occlusion with reconstitution of the profunda and superficial femoral artery distally  I think [name] would have significant improvement with left iliofemoral endarterectomy with patch plasty.  With her medical comorbidities including diabetes, as well as her obesity, she is at high risk for wound breakdown and infection.  I was very clear about this during our discussion.  After discussing the risks and benefits, [name] elected to proceed with surgery.    She has been cleared by both  neurology and cardiology.  I plan to schedule her for left-sided iliofemoral endarterectomy at her convenience.    Kayla Part, MD [facility] [phone] Total time of patient care including pre-visit research, consultation, and documentation greater than 20 minutes

## 2024-05-05 NOTE — Anesthesia Procedure Notes (Signed)
 Arterial Line Insertion Start/End5/16/2025 9:55 AM, 05/05/2024 10:05 AM Performed by: Conard Decent, MD, Alphia Jasmine, CRNA, CRNA  Patient location: Pre-op. Preanesthetic checklist: patient identified, IV checked, site marked, risks and benefits discussed, surgical consent, monitors and equipment checked, pre-op evaluation, timeout performed and anesthesia consent Lidocaine  1% used for infiltration Right, radial was placed Catheter size: 20 G Hand hygiene performed  and maximum sterile barriers used   Attempts: 2 Procedure performed using ultrasound guided (No image saved.) technique. Ultrasound Notes:anatomy identified, needle tip was noted to be adjacent to the nerve/plexus identified and no ultrasound evidence of intravascular and/or intraneural injection Following insertion, dressing applied and Biopatch. Post procedure assessment: normal and unchanged  Patient tolerated the procedure well with no immediate complications.

## 2024-05-05 NOTE — Transfer of Care (Signed)
 Immediate Anesthesia Transfer of Care Note  Patient: [name]  Procedure(s) Performed: ENDARTERECTOMY, FEMORAL (Left) ANGIOPLASTY, USING XENOSURE BIOLOGIC PATCH (Left: Groin)  Patient Location: PACU  Anesthesia Type:General  Level of Consciousness: awake and drowsy  Airway & Oxygen Therapy: Patient Spontanous Breathing and Patient connected to face mask oxygen  Post-op Assessment: Report given to RN, Post -op Vital signs reviewed and stable, and Patient moving all extremities X 4  Post vital signs: Reviewed and stable  Last Vitals:  Vitals Value Taken Time  BP    Temp    Pulse    Resp    SpO2      Last Pain:  Vitals:   05/05/24 0917  TempSrc:   PainSc: 0-No pain         Complications: No notable events documented.

## 2024-05-05 NOTE — Progress Notes (Signed)
  Progress Note    05/05/2024 3:17 PM * Day of Surgery *  Subjective:  seen in PACU, drowsy after surgery but no complaints   Vitals:   05/05/24 1445 05/05/24 1500  BP: (!) 140/72 135/67  Pulse: 75 75  Resp: 18 19  Temp:    SpO2: 98% 97%   Physical Exam: Cardiac:  regular Lungs:  non labored Incisions:  left groin incision with Prevena VAC with good seal, groin soft without swelling or hematoma Extremities:  LLE well perfused and warm with palpable DP Abdomen:  soft Neurologic: alert and oriented  CBC    Component Value Date/Time   WBC 8.2 04/26/2024 1000   RBC 6.47 (H) 04/26/2024 1000   HGB 17.6 (H) 04/26/2024 1000   HGB 17.0 (H) 12/28/2023 0944   HCT 54.1 (H) 04/26/2024 1000   HCT 54.8 (H) 12/28/2023 0944   PLT 155 04/26/2024 1000   PLT 193 12/28/2023 0944   MCV 83.6 04/26/2024 1000   MCV 83 12/28/2023 0944   MCH 27.2 04/26/2024 1000   MCHC 32.5 04/26/2024 1000   RDW 15.4 04/26/2024 1000   RDW 14.0 12/28/2023 0944   LYMPHSABS 1.7 12/28/2023 0944   MONOABS 0.7 08/25/2022 1830   EOSABS 0.1 12/28/2023 0944   BASOSABS 0.1 12/28/2023 0944    BMET    Component Value Date/Time   NA 139 04/26/2024 1000   NA 134 12/28/2023 0944   K 4.4 04/26/2024 1000   CL 102 04/26/2024 1000   CO2 28 04/26/2024 1000   GLUCOSE 219 (H) 04/26/2024 1000   BUN 10 04/26/2024 1000   BUN 15 12/28/2023 0944   CREATININE 0.99 04/26/2024 1000   CREATININE 1.04 09/25/2011 1122   CALCIUM  9.3 04/26/2024 1000   GFRNONAA >60 04/26/2024 1000   GFRAA 120 02/11/2021 1620    INR    Component Value Date/Time   INR 1.0 04/26/2024 1000     Intake/Output Summary (Last 24 hours) at 05/05/2024 1517 Last data filed at 05/05/2024 1257 Gross per 24 hour  Intake 1500 ml  Output 200 ml  Net 1300 ml     Assessment/Plan:  48 y.o. female is s/p Left iliofemoral endarterectomy with bovine pericardial patch plasty * Day of Surgery *   Left groin prevena VAC with good seal, soft around  incision, no swelling or hematoma Palpable Left DP pulse Pain well controlled Hemodynamically stable To 4E later this afternoon  Deneen Finical, PA-C [facility] [phone] 05/05/2024 3:17 PM

## 2024-05-05 NOTE — Anesthesia Postprocedure Evaluation (Signed)
 Anesthesia Post Note  Patient: [name]  Procedure(s) Performed: ENDARTERECTOMY, FEMORAL (Left) ANGIOPLASTY, USING XENOSURE BIOLOGIC PATCH (Left: Groin)     Patient location during evaluation: PACU Anesthesia Type: General Level of consciousness: awake Pain management: pain level controlled Vital Signs Assessment: post-procedure vital signs reviewed and stable Respiratory status: spontaneous breathing, nonlabored ventilation and respiratory function stable Cardiovascular status: blood pressure returned to baseline and stable Postop Assessment: no apparent nausea or vomiting Anesthetic complications: no   No notable events documented.  Last Vitals:  Vitals:   05/05/24 1330 05/05/24 1345  BP: 124/65 (!) 140/67  Pulse: 77 77  Resp: 17 16  Temp:    SpO2: 97% 98%    Last Pain:  Vitals:   05/05/24 1300  TempSrc:   PainSc: Asleep                 Conard Decent

## 2024-05-06 LAB — LIPID PANEL
Cholesterol: 121 mg/dL (ref 0–200)
HDL: 24 mg/dL — ABNORMAL LOW (ref >40)
LDL Cholesterol: 72 mg/dL (ref 0–99)
Total CHOL/HDL Ratio: 5 ratio
Triglycerides: 127 mg/dL (ref <150)
VLDL: 25 mg/dL (ref 0–40)

## 2024-05-06 LAB — GLUCOSE, CAPILLARY
Glucose-Capillary: 143 mg/dL — ABNORMAL HIGH (ref 70–99)
Glucose-Capillary: 178 mg/dL — ABNORMAL HIGH (ref 70–99)
Glucose-Capillary: 163 mg/dL — ABNORMAL HIGH (ref 70–99)
Glucose-Capillary: 163 mg/dL — ABNORMAL HIGH (ref 70–99)

## 2024-05-06 LAB — CBC
HCT: 45.8 % (ref 36.0–46.0)
Hemoglobin: 15 g/dL (ref 12.0–15.0)
MCH: 27.5 pg (ref 26.0–34.0)
MCHC: 32.8 g/dL (ref 30.0–36.0)
MCV: 83.9 fL (ref 80.0–100.0)
Platelets: 147 10*3/uL — ABNORMAL LOW (ref 150–400)
RBC: 5.46 MIL/uL — ABNORMAL HIGH (ref 3.87–5.11)
RDW: 15 % (ref 11.5–15.5)
WBC: 13.8 10*3/uL — ABNORMAL HIGH (ref 4.0–10.5)
nRBC: 0 % (ref 0.0–0.2)

## 2024-05-06 LAB — BASIC METABOLIC PANEL WITH GFR
Anion gap: 10 (ref 5–15)
BUN: 11 mg/dL (ref 6–20)
CO2: 23 mmol/L (ref 22–32)
Calcium: 8.4 mg/dL — ABNORMAL LOW (ref 8.9–10.3)
Chloride: 105 mmol/L (ref 98–111)
Creatinine, Ser: 0.84 mg/dL (ref 0.44–1.00)
GFR, Estimated: >60 mL/min (ref >60)
Glucose, Bld: 86 mg/dL (ref 70–99)
Potassium: 3.4 mmol/L — ABNORMAL LOW (ref 3.5–5.1)
Sodium: 138 mmol/L (ref 135–145)

## 2024-05-06 MED ORDER — EZETIMIBE 10 MG PO TABS
10.0000 mg | ORAL_TABLET | Freq: Every day | ORAL | Status: DC
  Administered 2024-05-06 – 2024-05-07 (×2): 10 mg via ORAL
  Filled 2024-05-06 (×2): qty 1

## 2024-05-06 NOTE — Progress Notes (Addendum)
  Progress Note    05/06/2024 8:04 AM 1 Day Post-Op  Subjective:  sitting up eating breakfast, says overall doing well   Vitals:   05/06/24 0300 05/06/24 0735  BP: (!) 113/58 130/72  Pulse: 92 100  Resp: 20 20  Temp: 98.5 F (36.9 C) 98.4 F (36.9 C)  SpO2: 92% 95%   Physical Exam: Cardiac:  regular Lungs:  non labored Incisions:  left groin incision with Prevena VAC to suction, good seal Extremities:  LLE remains well perfused with palpable DP Abdomen:  soft Neurologic: alert and oriented  CBC    Component Value Date/Time   WBC 13.8 (H) 05/06/2024 0013   RBC 5.46 (H) 05/06/2024 0013   HGB 15.0 05/06/2024 0013   HGB 17.0 (H) 12/28/2023 0944   HCT 45.8 05/06/2024 0013   HCT 54.8 (H) 12/28/2023 0944   PLT 147 (L) 05/06/2024 0013   PLT 193 12/28/2023 0944   MCV 83.9 05/06/2024 0013   MCV 83 12/28/2023 0944   MCH 27.5 05/06/2024 0013   MCHC 32.8 05/06/2024 0013   RDW 15.0 05/06/2024 0013   RDW 14.0 12/28/2023 0944   LYMPHSABS 1.7 12/28/2023 0944   MONOABS 0.7 08/25/2022 1830   EOSABS 0.1 12/28/2023 0944   BASOSABS 0.1 12/28/2023 0944    BMET    Component Value Date/Time   NA 138 05/06/2024 0013   NA 134 12/28/2023 0944   K 3.4 (L) 05/06/2024 0013   CL 105 05/06/2024 0013   CO2 23 05/06/2024 0013   GLUCOSE 86 05/06/2024 0013   BUN 11 05/06/2024 0013   BUN 15 12/28/2023 0944   CREATININE 0.84 05/06/2024 0013   CREATININE 1.04 09/25/2011 1122   CALCIUM  8.4 (L) 05/06/2024 0013   GFRNONAA >60 05/06/2024 0013   GFRAA 120 02/11/2021 1620    INR    Component Value Date/Time   INR 1.0 04/26/2024 1000     Intake/Output Summary (Last 24 hours) at 05/06/2024 0804 Last data filed at 05/06/2024 4098 Gross per 24 hour  Intake 2080 ml  Output 3960 ml  Net -1880 ml     Assessment/Plan:  48 y.o. female is s/p Left iliofemoral endarterectomy with bovine pericardial patch plasty  1 Day Post-Op   LLE well perfused and warm with palpable DP Left groin  incision with Prevena VAC, good seal Pain well controlled H&H stable Mobilize as tolerated Home when pain controlled and mobilizing better  Deneen Finical, Kirby Peoples [facility] [phone] 05/06/2024 8:04 AM  VASCULAR STAFF ADDENDUM: I have independently interviewed and examined the patient. I agree with the above.   Heber Little. Edgardo Goodwill, MD [facility] Phone Number: [phone] 05/06/2024 3:26 PM

## 2024-05-06 NOTE — Plan of Care (Signed)

## 2024-05-06 NOTE — Plan of Care (Signed)

## 2024-05-06 NOTE — Evaluation (Addendum)
 Physical Therapy Evaluation Patient Details Name: [name] MRN: [id number] DOB: 02/26/1976 Today's Date: 05/06/2024  History of Present Illness  48 y.o. female presents to [facility] 05/05/24 for L iliofemoral endarterectomy. PMHx: anemia, cardiomyopathy, CVA, HTN, NSTEMI, obesity, renal failure, T2DM  Clinical Impression  Pt in bed upon arrival and agreeable to PT eval. PTA, pt was ModI with SP cane. In today's session, pt required MinA for bed mobility and CGA/supervision to stand and ambulate 57ft with RW. Pt had increased pain in LLE with movement with increased time and effort needed for mobility. Pt has 24/7 physical assist available upon return home. Pt was about to begin working with OT/PT prior to admit with new referral needed. Pt currently with functional limitations due to the deficits listed below (see PT Problem List). Pt would benefit from acute skilled PT to address functional impairments. Acute PT to follow.         If plan is discharge home, recommend the following: A little help with walking and/or transfers;A little help with bathing/dressing/bathroom;Assistance with cooking/housework;Assist for transportation;Help with stairs or ramp for entrance   Can travel by private vehicle    Yes    Equipment Recommendations None recommended by PT     Functional Status Assessment Patient has had a recent decline in their functional status and demonstrates the ability to make significant improvements in function in a reasonable and predictable amount of time.     Precautions / Restrictions Precautions Precautions: Fall Precaution/Restrictions Comments: L groin wound vac Restrictions Weight Bearing Restrictions Per Provider Order: No      Mobility  Bed Mobility Overal bed mobility: Needs Assistance Bed Mobility: Supine to Sit    Supine to sit: Min assist, HOB elevated    General bed mobility comments: MinA to scoot hips forward to EOB using bed pad. Increased time and  effort due to pain in L groin    Transfers Overall transfer level: Needs assistance Equipment used: Rolling walker (2 wheels) Transfers: Sit to/from Stand Sit to Stand: Contact guard assist, Supervision    General transfer comment: CGA with cues for hand placement. Increased time and effort. Cues to keep L LE slightly anterior prior to standing/sitting    Ambulation/Gait Ambulation/Gait assistance: Contact guard assist, Supervision Gait Distance (Feet): 30 Feet Assistive device: Rolling walker (2 wheels) Gait Pattern/deviations: Step-to pattern, Decreased step length - left, Decreased stance time - left, Antalgic Gait velocity: decr     General Gait Details: antalgic gait pattern with decreased WB/step length on LLE. Cues to step first with LLE. Increased time and effort    Balance Overall balance assessment: Needs assistance, Mild deficits observed, not formally tested Sitting-balance support: No upper extremity supported, Feet supported Sitting balance-Leahy Scale: Fair     Standing balance support: No upper extremity supported Standing balance-Leahy Scale: Fair Standing balance comment: able to stand statically at sink with no UE support        Pertinent Vitals/Pain Pain Assessment Pain Assessment: Faces Faces Pain Scale: Hurts little more Pain Location: groin site Pain Descriptors / Indicators: Burning, Sore, Nagging Pain Intervention(s): Limited activity within patient's tolerance, Monitored during session, Repositioned, Premedicated before session    Home Living Family/patient expects to be discharged to:: Private residence Living Arrangements: Spouse/significant other;Children (BF, 28ish, 15 y/o) Available Help at Discharge: Family;Available 24 hours/day Type of Home: House Home Access: Ramped entrance      Home Layout: One level Home Equipment: Cane - single point;Shower seat - built in;Grab bars - Chartered loss adjuster (  2 wheels)      Prior Function  Prior Level of Function : Independent/Modified Independent    Mobility Comments: using SPC, starting PT on June 2nd ADLs Comments: mod I with DME, starting OT June 2nd     Extremity/Trunk Assessment   Upper Extremity Assessment Upper Extremity Assessment: Defer to OT evaluation    Lower Extremity Assessment Lower Extremity Assessment: LLE deficits/detail LLE Deficits / Details: WFL ROM, deferred MMT due to pain. At least 3/5 LLE Sensation: WNL    Cervical / Trunk Assessment Cervical / Trunk Assessment: Normal  Communication   Communication Communication: No apparent difficulties    Cognition Arousal: Alert Behavior During Therapy: WFL for tasks assessed/performed   PT - Cognitive impairments: No apparent impairments    Following commands: Intact       Cueing Cueing Techniques: Verbal cues     General Comments General comments (skin integrity, edema, etc.): L groin dry with wound vac attached prior to and after mobility. Blood noted with pt performing pericare after toileting with RN notified.     PT Assessment Patient needs continued PT services  PT Problem List Decreased strength;Decreased activity tolerance;Decreased balance;Decreased coordination       PT Treatment Interventions DME instruction;Gait training;Stair training;Therapeutic activities;Functional mobility training;Therapeutic exercise;Balance training;Neuromuscular re-education;Patient/family education    PT Goals (Current goals can be found in the Care Plan section)  Acute Rehab PT Goals Patient Stated Goal: to work with PT/OT after leaving the hospital PT Goal Formulation: With patient Time For Goal Achievement: 05/20/24 Potential to Achieve Goals: Good    Frequency Min 2X/week     Co-evaluation   Reason for Co-Treatment: To address functional/ADL transfers PT goals addressed during session: Mobility/safety with mobility;Balance;Proper use of DME         AM-PAC PT "6 Clicks" Mobility   Outcome Measure Help needed turning from your back to your side while in a flat bed without using bedrails?: A Little Help needed moving from lying on your back to sitting on the side of a flat bed without using bedrails?: A Little Help needed moving to and from a bed to a chair (including a wheelchair)?: A Little Help needed standing up from a chair using your arms (e.g., wheelchair or bedside chair)?: A Little Help needed to walk in hospital room?: A Little Help needed climbing 3-5 steps with a railing? : A Lot 6 Click Score: 17    End of Session Equipment Utilized During Treatment: Gait belt Activity Tolerance: Patient tolerated treatment well Patient left: in chair;with call bell/phone within reach Nurse Communication: Mobility status;Other (comment) (blood noted while performing pericare) PT Visit Diagnosis: Unsteadiness on feet (R26.81);Other abnormalities of gait and mobility (R26.89);Muscle weakness (generalized) (M62.81)    Time: 4098-1191 PT Time Calculation (min) (ACUTE ONLY): 37 min   Charges:   PT Evaluation $PT Eval Low Complexity: 1 Low   PT General Charges $$ ACUTE PT VISIT: 1 Visit        Orysia Blas, PT, DPT Secure Chat Preferred  Rehab Office [phone]   Alissa April Adela Ades 05/06/2024, 11:49 AM

## 2024-05-06 NOTE — Progress Notes (Signed)
 PHARMACIST LIPID MONITORING   [name] is a 48 y.o. female admitted on 05/05/2024 with occluded left common femoral artery.  Pharmacy has been consulted to optimize lipid-lowering therapy with the indication of secondary prevention for clinical ASCVD.  Recent Labs:  Lipid Panel (last 6 months):   Lab Results  Component Value Date   CHOL 121 05/06/2024   TRIG 127 05/06/2024   HDL 24 (L) 05/06/2024   CHOLHDL 5.0 05/06/2024   VLDL 25 05/06/2024   LDLCALC 72 05/06/2024    Hepatic function panel (last 6 months):   Lab Results  Component Value Date   AST 15 04/26/2024   ALT 16 04/26/2024   ALKPHOS 48 04/26/2024   BILITOT 0.9 04/26/2024    SCr (since admission):   Serum creatinine: 0.84 mg/dL 16/10/96 0454 Estimated creatinine clearance: 93.4 mL/min  Current therapy and lipid therapy tolerance Current lipid-lowering therapy: atorvastatin  80 mg Previous lipid-lowering therapies (if applicable): n/a Documented or reported allergies or intolerances to lipid-lowering therapies (if applicable): n/a  Assessment:   Patient LDL 72 mg/dL, near goal of <09 mg/dL on atorvastatin  80 mg. Patient agrees with changes to lipid-lowering therapy  Plan:    1.Statin intensity (high intensity recommended for all patients regardless of the LDL):  No statin changes. The patient is already on a high intensity statin.  2.Add ezetimibe (if any one of the following):  Start ezetimibe 10 mg PO daily.  On a high intensity statin with LDL > 70.  3.Refer to lipid clinic:   No  4.Follow-up with:  Cardiology provider - Ralene Burger, MD  5.Follow-up labs after discharge:  Changes in lipid therapy were made. Check a lipid panel in 8-12 weeks then annually.     Volney Grumbles, PharmD PGY-1 Acute Care Pharmacy Resident 05/06/2024 7:05 AM

## 2024-05-06 NOTE — Evaluation (Signed)
 Occupational Therapy Evaluation Patient Details Name: [name] MRN: [id number] DOB: December 02, 1976 Today's Date: 05/06/2024   History of Present Illness   48 y.o. female presents to [facility] 05/05/24 for L iliofemoral endarterectomy. PMHx: anemia, cardiomyopathy, CVA, HTN, NSTEMI, obesity, renal failure, T2DM     Clinical Impressions Pt is typically mod I with SPC for mobility and utilizing shower chair for bathing - but otherwise independent. Pt is getting HHOT/PT evaluations on June 2nd. Today she was premedicated prior to the session and able to demonstrate transfers (toilet and bed>recliner) sink level grooming, and in room mobilization. She requires max A for LB ADL at this time, set up for UB ADL and able to perform standing for brief periods of time for functional ADL. Recommending continue OT in the acute setting as well as post-acute OT at the [facility] level. Next session take AE demo kit and educate on improved access to LB through AE.      If plan is discharge home, recommend the following:   A little help with walking and/or transfers;A lot of help with bathing/dressing/bathroom;Assist for transportation;Help with stairs or ramp for entrance     Functional Status Assessment   Patient has had a recent decline in their functional status and demonstrates the ability to make significant improvements in function in a reasonable and predictable amount of time.     Equipment Recommendations   BSC/3in1;Other (comment) (AE: grabber/long handle sponge)     Recommendations for Other Services   PT consult     Precautions/Restrictions   Precautions Precautions: Fall Precaution/Restrictions Comments: L groin wound vac Restrictions Weight Bearing Restrictions Per Provider Order: No     Mobility Bed Mobility Overal bed mobility: Needs Assistance Bed Mobility: Supine to Sit     Supine to sit: Min assist, HOB elevated     General bed mobility comments: MinA to scoot hips  forward to EOB using bed pad. Increased time and effort due to pain in L groin    Transfers Overall transfer level: Needs assistance Equipment used: Rolling walker (2 wheels) Transfers: Sit to/from Stand Sit to Stand: Contact guard assist, Supervision           General transfer comment: CGA with cues for hand placement. Increased time and effort. Cues to keep L LE slightly anterior prior to standing/sitting      Balance Overall balance assessment: Needs assistance, Mild deficits observed, not formally tested Sitting-balance support: No upper extremity supported, Feet supported Sitting balance-Leahy Scale: Fair     Standing balance support: No upper extremity supported Standing balance-Leahy Scale: Fair Standing balance comment: able to stand statically at sink with no UE support                           ADL either performed or assessed with clinical judgement   ADL Overall ADL's : Needs assistance/impaired Eating/Feeding: Modified independent;Sitting   Grooming: Wash/dry hands;Wash/dry face;Oral care;Minimal assistance;Standing Grooming Details (indicate cue type and reason): sink level. Upper Body Bathing: Minimal assistance Upper Body Bathing Details (indicate cue type and reason): for back Lower Body Bathing: Maximal assistance Lower Body Bathing Details (indicate cue type and reason): knees down Upper Body Dressing : Set up;Sitting   Lower Body Dressing: Maximal assistance Lower Body Dressing Details (indicate cue type and reason): donning socks Toilet Transfer: Contact guard assist;Ambulation;Regular Toilet;Grab bars Toilet Transfer Details (indicate cue type and reason): in bathroom Toileting- Clothing Manipulation and Hygiene: Maximal assistance;Sit to/from stand Toileting -  Clothing Manipulation Details (indicate cue type and reason): Pt able to perform, OT also performed for thoroughness     Functional mobility during ADLs: Contact guard  assist;Rolling walker (2 wheels) General ADL Comments: decreased activity tolerance, decreased balance, decreased access to LB for ADL     Vision Baseline Vision/History: 1 Wears glasses Ability to See in Adequate Light: 0 Adequate Patient Visual Report: No change from baseline Vision Assessment?: No apparent visual deficits     Perception         Praxis         Pertinent Vitals/Pain Pain Assessment Pain Assessment: 0-10 Pain Score: 4  Faces Pain Scale: Hurts little more Pain Location: groin site Pain Descriptors / Indicators: Burning, Sore, Nagging Pain Intervention(s): Monitored during session, Repositioned     Extremity/Trunk Assessment Upper Extremity Assessment Upper Extremity Assessment: Generalized weakness   Lower Extremity Assessment Lower Extremity Assessment: Defer to PT evaluation LLE Deficits / Details: WFL ROM, deferred MMT due to pain. At least 3/5 LLE Sensation: WNL   Cervical / Trunk Assessment Cervical / Trunk Assessment: Normal   Communication Communication Communication: No apparent difficulties   Cognition Arousal: Alert Behavior During Therapy: WFL for tasks assessed/performed                                 Following commands: Intact       Cueing  General Comments   Cueing Techniques: Verbal cues  L groin dry with wound vac attached prior to and after mobility. Blood noted with pt performing pericare after toileting with RN notified.   Exercises     Shoulder Instructions      Home Living Family/patient expects to be discharged to:: Private residence Living Arrangements: Spouse/significant other;Children (BF, 28ish, 58 y/o) Available Help at Discharge: Family;Available 24 hours/day Type of Home: House Home Access: Ramped entrance     Home Layout: One level     Bathroom Shower/Tub: Walk-in Pensions consultant: Standard Bathroom Accessibility: Yes How Accessible: Accessible via walker Home  Equipment: Cane - single point;Shower seat - built in;Grab bars - Chartered loss adjuster (2 wheels)          Prior Functioning/Environment Prior Level of Function : Independent/Modified Independent             Mobility Comments: using SPC, starting PT on June 2nd ADLs Comments: mod I with DME, starting OT June 2nd    OT Problem List: Decreased strength;Decreased activity tolerance;Decreased range of motion;Impaired balance (sitting and/or standing)   OT Treatment/Interventions: Self-care/ADL training;Energy conservation;DME and/or AE instruction;Therapeutic activities;Patient/family education;Balance training      OT Goals(Current goals can be found in the care plan section)   Acute Rehab OT Goals Patient Stated Goal: get back to independence OT Goal Formulation: With patient Time For Goal Achievement: 05/20/24 Potential to Achieve Goals: Good ADL Goals Pt Will Perform Grooming: with modified independence;standing Pt Will Perform Upper Body Dressing: with modified independence;sitting Pt Will Perform Lower Body Dressing: with supervision;sit to/from stand;with adaptive equipment Pt Will Transfer to Toilet: ambulating;with supervision Pt Will Perform Toileting - Clothing Manipulation and hygiene: with supervision;sit to/from stand;sitting/lateral leans   OT Frequency:  Min 2X/week    Co-evaluation   Reason for Co-Treatment: To address functional/ADL transfers PT goals addressed during session: Mobility/safety with mobility;Balance;Proper use of DME        AM-PAC OT "6 Clicks" Daily Activity     Outcome Measure Help  from another person eating meals?: None Help from another person taking care of personal grooming?: A Little Help from another person toileting, which includes using toliet, bedpan, or urinal?: A Lot Help from another person bathing (including washing, rinsing, drying)?: A Lot Help from another person to put on and taking off regular upper body  clothing?: A Little Help from another person to put on and taking off regular lower body clothing?: A Lot 6 Click Score: 16   End of Session Equipment Utilized During Treatment: Gait belt;Rolling walker (2 wheels) Nurse Communication: Mobility status;Precautions  Activity Tolerance: Patient tolerated treatment well Patient left: in chair;with call bell/phone within reach;with chair alarm set  OT Visit Diagnosis: Unsteadiness on feet (R26.81);Other abnormalities of gait and mobility (R26.89);Muscle weakness (generalized) (M62.81);Pain Pain - Right/Left: Left Pain - part of body: Leg (groin)                Time: 4696-2952 OT Time Calculation (min): 35 min Charges:  OT General Charges $OT Visit: 1 Visit OT Evaluation $OT Eval Moderate Complexity: 1 Mod  [place] OTR/L Acute Rehabilitation Services Office: [phone]  Quincy Buba 05/06/2024, 3:35 PM

## 2024-05-07 LAB — POCT ACTIVATED CLOTTING TIME
Activated Clotting Time: 256 s
Activated Clotting Time: 205 s

## 2024-05-07 LAB — GLUCOSE, CAPILLARY: Glucose-Capillary: 93 mg/dL (ref 70–99)

## 2024-05-07 MED ORDER — OXYCODONE-ACETAMINOPHEN 5-325 MG PO TABS: 1.0000 | ORAL_TABLET | ORAL | 0 refills | Status: DC | PRN

## 2024-05-07 NOTE — Progress Notes (Addendum)
 OT is recommending a 3-in-1 BSC. Discussed with pt the DME. She agreed with the 3-in-1 BSC. Pt doesn't have a preference for a DME agency. Contacted Jermaine with [employer] for the DME referral. He accepted the referral.

## 2024-05-07 NOTE — Discharge Summary (Signed)
 [facility] Discharge Summary   Patient ID:  [name] MRN: [id number] DOB/AGE: August 30, 1976 48 y.o.  Admit date: 05/05/2024 Discharge date: 05/07/2024 Attending Surgeon: [name] Admission Diagnosis: Claudication, intermittent (HCC) [I73.9] Atherosclerosis of artery of extremity with rest pain [facility]) [I70.229]  Discharge Diagnoses:  Claudication, intermittent (HCC) [I73.9] Atherosclerosis of artery of extremity with rest pain (HCC) [I70.229]  Secondary Diagnoses: Past Medical History:  Diagnosis Date   Abnormal stress test 08/20/2016   Formatting of this note might be different from the original. Added automatically from request for surgery [id number]   Acute appendicitis 01/28/2016   Acute appendicitis with localized peritonitis 01/28/2016   Acute non-ST segment elevation myocardial infarction (HCC) 03/17/2021   Anemia    Angina pectoris (HCC) 06/27/2020   Anxiety disorder 03/17/2021   Calculus of gallbladder 06/05/2016   Cardiomyopathy (HCC)    "postpartem cardiomyopathy"- 2012- has since been released by cardiology   Coronary artery disease 06/27/2020   COVID 10/14/2020   c/o cough and chest pressure    CVA (cerebral vascular accident) (HCC)    Depression    Dyslipidemia 06/27/2020   Dyspnea on exertion 06/27/2020   History of coronary artery bypass graft 09/22/2016   Formatting of this note might be different from the original. LIMA to LAD, SVG to PDA in September 2017 at [facility]   Hyperglycemia due to type 2 diabetes mellitus (HCC) 02/25/2016   Hypertension    Liver abscess    2012- Bradly Cage drained placed for awhile.   Long-term insulin  use (HCC) 01/26/2017   Mixed hyperlipidemia 02/25/2016   NSTEMI (non-ST elevated myocardial infarction) (HCC) 06/07/2020   Obesity 09/18/2011   PCOS (polycystic ovarian syndrome) 02/25/2016   Persistent microalbuminuria associated with type 2 diabetes mellitus (HCC) 02/25/2016   Renal  failure 09/25/2011   Due to vancomycin , IV contrast, sepsis.     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  Was hospitalized, intubated, had cardiopulmonary failure.    Tobacco abuse    Type 2 diabetes mellitus with hyperglycemia (HCC) 02/25/2016    Procedures: 05/05/2024 Procedure(s): ENDARTERECTOMY, FEMORAL ANGIOPLASTY, USING XENOSURE BIOLOGIC PATCH  Discharged Condition: good  HPI: [name] is a 48 y.o. (1976-10-04) female presenting via phone call with known occlusion of the left common femoral artery and severe, lifestyle and claudication.  She was at her home, and I was at my office.     At her last visit, she noted strokelike symptoms.  While we discussed surgery, asked that she be cleared by cardiology, as well as see neurology.  Imaging demonstrated stroke.  She is now optimized on aspirin , Plavix , statin.     Since last seen, [name] has been doing fair.  She continues to have severe, lifestyle-limiting claudication.  The leg hurts her at night on some occasions.  She denies tissue loss.   A native of [place], she continues to live there.  Medical history is extensive at the age of 65, and includes NSTEMI-CABG, type 2 diabetes, PAD-right iliac artery stenting - [facility].  She continues to work full-time, daily smoker roughly half pack per day.       The pt is not on a statin for cholesterol management.  The pt is  on a daily aspirin .   Other  AC:  - The pt is  on medication for hypertension.   The pt is  diabetic.  Tobacco hx:  current   Hospital Course: Patient admitted for left iliofemoral endarterectomy and patch angioplasty.  She tolerated this well.  She had an uneventful hospital recovery with no major issues.  She was discharged on  postoperative day 2 tolerating diet, ambulating, with pain well-controlled.  She will follow-up with us  in 4 weeks with ankle-brachial index and left lower extremity arterial duplex.  Consults:  None  Significant Diagnostic Studies: CBC    Component Value Date/Time   WBC 13.8 (H) 05/06/2024 0013   RBC 5.46 (H) 05/06/2024 0013   HGB 15.0 05/06/2024 0013   HGB 17.0 (H) 12/28/2023 0944   HCT 45.8 05/06/2024 0013   HCT 54.8 (H) 12/28/2023 0944   PLT 147 (L) 05/06/2024 0013   PLT 193 12/28/2023 0944   MCV 83.9 05/06/2024 0013   MCV 83 12/28/2023 0944   MCH 27.5 05/06/2024 0013   MCHC 32.8 05/06/2024 0013   RDW 15.0 05/06/2024 0013   RDW 14.0 12/28/2023 0944   LYMPHSABS 1.7 12/28/2023 0944   MONOABS 0.7 08/25/2022 1830   EOSABS 0.1 12/28/2023 0944   BASOSABS 0.1 12/28/2023 0944    BMET    Component Value Date/Time   NA 138 05/06/2024 0013   NA 134 12/28/2023 0944   K 3.4 (L) 05/06/2024 0013   CL 105 05/06/2024 0013   CO2 23 05/06/2024 0013   GLUCOSE 86 05/06/2024 0013   BUN 11 05/06/2024 0013   BUN 15 12/28/2023 0944   CREATININE 0.84 05/06/2024 0013   CREATININE 1.04 09/25/2011 1122   CALCIUM  8.4 (L) 05/06/2024 0013   GFRNONAA >60 05/06/2024 0013   GFRAA 120 02/11/2021 1620    COAG estimated creatinine clearance is 93.4 mL/min (by C-G formula based on SCr of 0.84 mg/dL).  No results found for: "PTT"  Disposition:  Discharge to :Home Discharge Instructions     Call MD for:  redness, tenderness, or signs of infection (pain, swelling, bleeding, redness, odor or green/yellow discharge around incision site)   Complete by: As directed    Call MD for:  severe or increased pain, loss or decreased feeling  in affected limb(s)   Complete by: As directed    Call MD for:  temperature >100.5   Complete by: As directed    Driving Restrictions   Complete by: As directed    No driving while taking narcotic pain medicine   Lifting restrictions   Complete by: As directed     No lifting greater than a gallon of milk until seen in the office   No dressing needed   Complete by: As directed    Keep Prevena vacuum bandage over left groin until 05/12/2024.  If the vacuum bandage fails before this, remove it and apply a clean gauze bandage.  Once vacuum bandage is removed, keep clean gauze bandage over groin at all times.  Change daily and as needed for soiling.  Call vascular surgery for any concerns about the groin incision.   Resume previous diet   Complete by: As directed       Allergies as of 05/07/2024   No Known Allergies      Medication List     TAKE these medications    albuterol  108 (90 Base) MCG/ACT inhaler Commonly known as: VENTOLIN  HFA Inhale 2 puffs into the lungs every 6 (six) hours as needed for wheezing or shortness of breath.  aspirin  EC 81 MG tablet Take 1 tablet (81 mg total) by mouth daily. Swallow whole.   atorvastatin  80 MG tablet Commonly known as: LIPITOR  Take 1 tablet (80 mg total) by mouth daily.   carvedilol  12.5 MG tablet Commonly known as: COREG  Take 2 tablets (25 mg total) by mouth 2 (two) times daily with a meal. What changed: when to take this   clopidogrel  75 MG tablet Commonly known as: PLAVIX  Take 75 mg by mouth daily.   FreeStyle Libre 2 Plus Sensor Misc every 14 (fourteen) days.   FreeStyle Libre 2 Reader Seymour Dapper every 14 (fourteen) days.   gabapentin  100 MG capsule Commonly known as: NEURONTIN  Take 100 mg by mouth 3 (three) times daily.   glipiZIDE  10 MG 24 hr tablet Commonly known as: GLUCOTROL  XL Take 10 mg by mouth daily.   Jardiance  25 MG Tabs tablet Generic drug: empagliflozin  Take 25 mg by mouth daily.   losartan  50 MG tablet Commonly known as: COZAAR  Take 1 tablet (50 mg total) by mouth daily.   metFORMIN  500 MG 24 hr tablet Commonly known as: GLUCOPHAGE -XR Take 2 tablets (1,000 mg total) by mouth 2 (two) times daily.   nicotine  21 mg/24hr patch Commonly known as: NICODERM CQ  - dosed in  mg/24 hours Place 21 mg onto the skin daily.   nitroGLYCERIN  0.4 MG SL tablet Commonly known as: NITROSTAT  DISSOLVE ONE TABLET UNDER THE TONGUE EVERY 5 MINUTES AS NEEDED   NovoLIN 70/30 Kwikpen (70-30) 100 UNIT/ML KwikPen Generic drug: insulin  isophane & regular human KwikPen Inject 30 Units into the skin in the morning and at bedtime.   oxyCODONE -acetaminophen  5-325 MG tablet Commonly known as: Percocet Take 1 tablet by mouth every 4 (four) hours as needed for severe pain (pain score 7-10).   OZEMPIC (0.25 OR 0.5 MG/DOSE) [place] Inject 0.5 mg into the skin once a week.   torsemide  20 MG tablet Commonly known as: DEMADEX  Take 20 mg by mouth daily.        Verbal and written Discharge instructions given to the patient. Wound care per Discharge AVS  Heber Little. Edgardo Goodwill, MD [facility] Specialists of [facility] Phone Number: [phone] 05/07/2024 9:11 AM

## 2024-05-07 NOTE — Progress Notes (Addendum)
 VASCULAR AND VEIN SPECIALISTS OF [facility] PROGRESS NOTE  ASSESSMENT / PLAN: [name] is a 48 y.o. female status post left iliofemoral endarterectomy with bovine pericardial patch angioplasty 05/05/2024.  Overall doing very well.  Recommend:  Abstinence from all tobacco products. Blood glucose control with goal A1c < 7%. Blood pressure control with goal blood pressure < 140/90 mmHg. Lipid reduction therapy with goal LDL-C <55 Aspirin  81mg  PO QD.  Clopidogrel  75 mg mouth daily Atorvastatin  40-80mg  PO QD (or other "high intensity" statin therapy).  Patient stable for discharge.  Follow-up with Dr. Rosalva Comber in 4 weeks with ankle-brachial index and left lower extremity duplex.  Keep Prevena VAC on until 05/12/2024.  After that or if VAC fails, start dry dressing changes to the left groin.  SUBJECTIVE: No complaints this morning.  Wants to go home.  Has ambulated in the room.  Has tolerated diet.  No pain in the left foot.  OBJECTIVE: BP 125/74 (BP Location: Right Arm)   Pulse 84   Temp 98.2 F (36.8 C) (Oral)   Resp 14   Ht 5\' 7"  (1.702 m)   Wt 90 kg   LMP 04/13/2024 (Approximate) Comment: UPT neg on 05/05/2024  SpO2 92%   BMI 31.09 kg/m   Intake/Output Summary (Last 24 hours) at 05/07/2024 0905 Last data filed at 05/06/2024 1300 Gross per 24 hour  Intake 480 ml  Output --  Net 480 ml    Middle-age woman in no distress Regular rate and rhythm Unlabored breathing Left groin bandage intact with good seal Palpable dorsalis pedis pulse on the left     Latest Ref Rng & Units 05/06/2024   12:13 AM 05/05/2024    4:38 PM 04/26/2024   10:00 AM  CBC  WBC 4.0 - 10.5 K/uL 13.8  11.0  8.2   Hemoglobin 12.0 - 15.0 g/dL 16.1  09.6  04.5   Hematocrit 36.0 - 46.0 % 45.8  46.0  54.1   Platelets 150 - 400 K/uL 147  137  155         Latest Ref Rng & Units 05/06/2024   12:13 AM 05/05/2024    4:38 PM 04/26/2024   10:00 AM  CMP  Glucose 70 - 99 mg/dL 86   409   BUN 6 - 20 mg/dL 11   10    Creatinine 8.11 - 1.00 mg/dL 9.14  7.82  9.56   Sodium 135 - 145 mmol/L 138   139   Potassium 3.5 - 5.1 mmol/L 3.4   4.4   Chloride 98 - 111 mmol/L 105   102   CO2 22 - 32 mmol/L 23   28   Calcium  8.9 - 10.3 mg/dL 8.4   9.3   Total Protein 6.5 - 8.1 g/dL   6.7   Total Bilirubin 0.0 - 1.2 mg/dL   0.9   Alkaline Phos 38 - 126 U/L   48   AST 15 - 41 U/L   15   ALT 0 - 44 U/L   16     Estimated Creatinine Clearance: 93.4 mL/min (by C-G formula based on SCr of 0.84 mg/dL).  Heber Little. Edgardo Goodwill, MD [facility] V[facility] and Vein Specialists of [facility] Phone Number: [phone] 05/07/2024 9:05 AM

## 2024-05-07 NOTE — Plan of Care (Signed)

## 2024-05-07 NOTE — Progress Notes (Signed)
   05/07/24 1053  AVS Discharge Documentation  AVS Discharge Instructions Including Medications Provided to patient/caregiver  Name of Person Receiving AVS Discharge Instructions Including Medications Synthia Ewing RN  Name of Clinician That Reviewed AVS Discharge Instructions Including Medications Zina Hilts    AVS instructions were reviewed with patient and family at bedside. All questions were answered, all personal belongings were returned.   Patient awaiting DME.

## 2024-05-08 ENCOUNTER — Encounter (HOSPITAL_COMMUNITY): Payer: Self-pay | Admitting: Vascular Surgery

## 2024-05-29 ENCOUNTER — Other Ambulatory Visit: Payer: Self-pay

## 2024-05-29 DIAGNOSIS — I739 Peripheral vascular disease, unspecified: Secondary | ICD-10-CM

## 2024-05-30 ENCOUNTER — Telehealth: Payer: Self-pay

## 2024-05-30 NOTE — Telephone Encounter (Signed)
 Orders faxed to [facility] from Dr. Samara Crest

## 2024-06-06 ENCOUNTER — Encounter: Payer: Self-pay | Admitting: Cardiology

## 2024-06-08 ENCOUNTER — Ambulatory Visit: Payer: PRIVATE HEALTH INSURANCE | Attending: Cardiology | Admitting: Cardiology

## 2024-06-08 VITALS — BP 110/72 | HR 90 | Ht 67.0 in | Wt 199.6 lb

## 2024-06-08 DIAGNOSIS — M79605 Pain in left leg: Secondary | ICD-10-CM

## 2024-06-08 DIAGNOSIS — I25708 Atherosclerosis of coronary artery bypass graft(s), unspecified, with other forms of angina pectoris: Secondary | ICD-10-CM | POA: Diagnosis not present

## 2024-06-08 DIAGNOSIS — Z794 Long term (current) use of insulin: Secondary | ICD-10-CM

## 2024-06-08 DIAGNOSIS — I739 Peripheral vascular disease, unspecified: Secondary | ICD-10-CM | POA: Diagnosis present

## 2024-06-08 DIAGNOSIS — I10 Essential (primary) hypertension: Secondary | ICD-10-CM | POA: Insufficient documentation

## 2024-06-08 DIAGNOSIS — M79604 Pain in right leg: Secondary | ICD-10-CM

## 2024-06-08 DIAGNOSIS — Z951 Presence of aortocoronary bypass graft: Secondary | ICD-10-CM | POA: Diagnosis present

## 2024-06-08 DIAGNOSIS — E1165 Type 2 diabetes mellitus with hyperglycemia: Secondary | ICD-10-CM

## 2024-06-08 DIAGNOSIS — I255 Ischemic cardiomyopathy: Secondary | ICD-10-CM | POA: Diagnosis not present

## 2024-06-08 NOTE — Addendum Note (Signed)
 Addended by: Shawnee Dellen D on: 06/08/2024 01:54 PM   Modules accepted: Orders

## 2024-06-08 NOTE — Patient Instructions (Signed)
 Medication Instructions:  Your physician recommends that you continue on your current medications as directed. Please refer to the Current Medication list given to you today.  *If you need a refill on your cardiac medications before your next appointment, please call your pharmacy*   Lab Work: None Ordered If you have labs (blood work) drawn today and your tests are completely normal, you will receive your results only by: MyChart Message (if you have MyChart) OR A paper copy in the mail If you have any lab test that is abnormal or we need to change your treatment, we will call you to review the results.   Testing/Procedures: Your physician has requested that you have a lower or upper extremity venous duplex. This test is an ultrasound of the veins in the legs or arms. It looks at venous blood flow that carries blood from the heart to the legs or arms. Allow one hour for a Lower Venous exam. Allow thirty minutes for an Upper Venous exam. There are no restrictions or special instructions.  Please note: We ask at that you not bring children with you during ultrasound (echo/ vascular) testing. Due to room size and safety concerns, children are not allowed in the ultrasound rooms during exams. Our front office staff cannot provide observation of children in our lobby area while testing is being conducted. An adult accompanying a patient to their appointment will only be allowed in the ultrasound room at the discretion of the ultrasound technician under special circumstances. We apologize for any inconvenience.    Follow-Up: At [facility], you and your health needs are our priority.  As part of our continuing mission to provide you with exceptional heart care, we have created designated Provider Care Teams.  These Care Teams include your primary Cardiologist (physician) and Advanced Practice Providers (APPs -  Physician Assistants and Nurse Practitioners) who all work together to provide you with  the care you need, when you need it.  We recommend signing up for the patient portal called MyChart.  Sign up information is provided on this After Visit Summary.  MyChart is used to connect with patients for Virtual Visits (Telemedicine).  Patients are able to view lab/test results, encounter notes, upcoming appointments, etc.  Non-urgent messages can be sent to your provider as well.   To learn more about what you can do with MyChart, go to [email or web address].    Your next appointment:   5 month(s)  The format for your next appointment:   In Person  Provider:   Ralene Burger, MD    Other Instructions NA

## 2024-06-08 NOTE — Progress Notes (Signed)
 Cardiology Office Note:    Date:  06/08/2024   ID:  [name], DOB March 18, 1976, MRN [id number]  PCP:  Teofilo Fellers, NP  Cardiologist:  Ralene Burger, MD    Referring MD: Teofilo Fellers, NP   Chief Complaint  Patient presents with   Follow-up    History of Present Illness:    [name] is a 48 y.o. female  complex past medical history in 2017 she did have coronary artery bypass graft with LIMA to LAD SVG to RCA history. Significant for essential hypertension type 2 diabetes smoking to still ongoing, in 2021 she had cardiac catheterization done which showed completely occluded LAD however LIMA to LAD was functional and patent, there was complete occlusion of the proximal and mid RCA SVG to RCA was completely occluded however RCA distal portion had pretty decent collateralization. She also got 70% mid to distal obtuse marginal branch. She does have severe peripheral vascular disease.  About a month ago she was admitted to the hospital she did have left femoral artery anterior arthrectomy done for severe claudications.  She feels much better however there is swelling of left lower extremities to the point that she had difficulty putting shoes on which is probably just related to surgery but I want to make sure she does not have DVT.  Calf is somewhat tender she is on dual antiplatelet therapy.  Denies of any cardiac complaints there is no chest pain tightness squeezing pressure burning chest.  But there is significant improvement in her ability to exercise/walk after endarterectomy of femoral artery.  Past Medical History:  Diagnosis Date   Abnormal stress test 08/20/2016   Formatting of this note might be different from the original. Added automatically from request for surgery [id number]   Acute appendicitis 01/28/2016   Acute appendicitis with localized peritonitis 01/28/2016   Acute non-ST segment elevation myocardial infarction (HCC) 03/17/2021   Anemia    Angina pectoris (HCC)  06/27/2020   Anxiety disorder 03/17/2021   Calculus of gallbladder 06/05/2016   Cardiomyopathy (HCC)    postpartem cardiomyopathy- 2012- has since been released by cardiology   Coronary artery disease 06/27/2020   COVID 10/14/2020   c/o cough and chest pressure    CVA (cerebral vascular accident) (HCC)    Depression    Dyslipidemia 06/27/2020   Dyspnea on exertion 06/27/2020   History of coronary artery bypass graft 09/22/2016   Formatting of this note might be different from the original. LIMA to LAD, SVG to PDA in September 2017 at [facility]   Hyperglycemia due to type 2 diabetes mellitus (HCC) 02/25/2016   Hypertension    Liver abscess    2012- Bradly Cage drained placed for awhile.   Long-term insulin  use (HCC) 01/26/2017   Mixed hyperlipidemia 02/25/2016   NSTEMI (non-ST elevated myocardial infarction) (HCC) 06/07/2020   Obesity 09/18/2011   PCOS (polycystic ovarian syndrome) 02/25/2016   Persistent microalbuminuria associated with type 2 diabetes mellitus (HCC) 02/25/2016   Renal failure 09/25/2011   Due to vancomycin , IV contrast, sepsis.     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  Was hospitalized, intubated, had cardiopulmonary failure.    Tobacco abuse    Type 2 diabetes mellitus with hyperglycemia (HCC) 02/25/2016  Past Surgical History:  Procedure Laterality Date   ABDOMINAL AORTOGRAM W/LOWER EXTREMITY N/A 12/29/2021   Procedure: ABDOMINAL AORTOGRAM W/LOWER EXTREMITY;  Surgeon: Avanell Leigh, MD;  Location: [facility];  Service: Cardiovascular;  Laterality: N/A;   ABDOMINAL AORTOGRAM W/LOWER EXTREMITY N/A 12/30/2023   Procedure: ABDOMINAL AORTOGRAM W/LOWER EXTREMITY;  Surgeon: Avanell Leigh, MD;   Location: [facility];  Service: Cardiovascular;  Laterality: N/A;   APPENDECTOMY     CORONARY ARTERY BYPASS GRAFT  2017   [facility]   CRYOTHERAPY     90's- cervix   DILATION AND CURETTAGE OF UTERUS  1994   ENDARTERECTOMY FEMORAL Left 05/05/2024   Procedure: ENDARTERECTOMY, FEMORAL;  Surgeon: Kayla Part, MD;  Location: [facility] OR;  Service: Vascular;  Laterality: Left;   LAPAROSCOPIC APPENDECTOMY N/A 01/28/2016   Procedure: APPENDECTOMY LAPAROSCOPIC;  Surgeon: Lockie Rima, MD;  Location: [facility] OR;  Service: General;  Laterality: N/A;   LEFT HEART CATH AND CORS/GRAFTS ANGIOGRAPHY N/A 06/07/2020   Procedure: LEFT HEART CATH AND CORS/GRAFTS ANGIOGRAPHY;  Surgeon: Swazil[place], Peter M, MD;  Location: [facility];  Service: Cardiovascular;  Laterality: N/A;   PATCH ANGIOPLASTY Left 05/05/2024   Procedure: ANGIOPLASTY, USING Corinna Dickens BIOLOGIC PATCH;  Surgeon: Kayla Part, MD;  Location: [facility] OR;  Service: Vascular;  Laterality: Left;   PERIPHERAL VASCULAR INTERVENTION  12/29/2021   Procedure: PERIPHERAL VASCULAR INTERVENTION;  Surgeon: Avanell Leigh, MD;  Location: [facility];  Service: Cardiovascular;;  Rt. Iliac   ROBOTIC ASSISTED SALPINGO OOPHERECTOMY Right 03/24/2016   Procedure: XI ROBOTIC ASSISTED LAPAROSCOPIC RIGHT SALPINGO OOPHORECTOMY;  Surgeon: Alphonso Aschoff, MD;  Location: [facility] ORS;  Service: Gynecology;  Laterality: Right;   TONSILLECTOMY      Current Medications: Current Meds  Medication Sig   albuterol  (VENTOLIN  HFA) 108 (90 Base) MCG/ACT inhaler Inhale 2 puffs into the lungs every 6 (six) hours as needed for wheezing or shortness of breath.   aspirin  EC 81 MG tablet Take 1 tablet (81 mg total) by mouth daily. Swallow whole.   atorvastatin  (LIPITOR ) 80 MG tablet Take 1 tablet (80 mg total) by mouth daily.   carvedilol  (COREG ) 12.5 MG tablet Take 2 tablets (25 mg total) by mouth 2 (two) times daily with a meal. (Patient taking differently: Take 25  mg by mouth daily.)   clopidogrel  (PLAVIX ) 75 MG tablet Take 75 mg by mouth daily.   Continuous Glucose Receiver (FREESTYLE LIBRE 2 READER) DEVI every 14 (fourteen) days.   Continuous Glucose Sensor (FREESTYLE LIBRE 2 PLUS SENSOR) MISC every 14 (fourteen) days.   empagliflozin  (JARDIANCE ) 25 MG TABS tablet Take 25 mg by mouth daily.   gabapentin  (NEURONTIN ) 100 MG capsule Take 100 mg by mouth 3 (three) times daily.   glipiZIDE  (GLUCOTROL  XL) 10 MG 24 hr tablet Take 10 mg by mouth daily.   losartan  (COZAAR ) 50 MG tablet Take 1 tablet (50 mg total) by mouth daily.   metFORMIN  (GLUCOPHAGE -XR) 500 MG 24 hr tablet Take 2 tablets (1,000 mg total) by mouth 2 (two) times daily.   nicotine  (NICODERM CQ  - DOSED IN MG/24 HOURS) 21 mg/24hr patch Place 21 mg onto the skin daily.   nitroGLYCERIN  (NITROSTAT ) 0.4 MG SL tablet DISSOLVE ONE TABLET UNDER THE TONGUE EVERY 5 MINUTES AS NEEDED   NOVOLIN 70/30 KWIKPEN (70-30) 100 UNIT/ML KwikPen Inject 30 Units into the skin in the morning and at bedtime.   oxyCODONE -acetaminophen  (PERCOCET) 5-325 MG tablet Take 1 tablet by  mouth every 4 (four) hours as needed for severe pain (pain score 7-10).   Semaglutide (OZEMPIC, 0.25 OR 0.5 MG/DOSE, [place]) Inject 0.5 mg into the skin once a week.   torsemide  (DEMADEX ) 20 MG tablet Take 20 mg by mouth daily.     Allergies:   Patient has no known allergies.   Social History   Socioeconomic History   Marital status: Single    Spouse name: Not on file   Number of children: Not on file   Years of education: Not on file   Highest education level: Not on file  Occupational History   Not on file  Tobacco Use   Smoking status: Every Day    Current packs/day: 0.50    Average packs/day: 0.5 packs/day for 0.4 years (0.2 ttl pk-yrs)    Types: Cigarettes    Start date: 01/22/2024   Smokeless tobacco: Never  Vaping Use   Vaping status: Never Used  Substance and Sexual Activity   Alcohol use: Yes    Comment: rare social   Drug  use: No    Comment: x1 episode Meth use.   Sexual activity: Not Currently    Birth control/protection: None  Other Topics Concern   Not on file  Social History Narrative   Not on file   Social Drivers of Health   Financial Resource Strain: Not on file  Food Insecurity: Patient Declined (05/06/2024)   Hunger Vital Sign    Worried About Running Out of Food in the Last Year: Patient declined    Ran Out of Food in the Last Year: Patient declined  Transportation Needs: No Transportation Needs (05/06/2024)   PRAPARE - Administrator, Civil Service (Medical): No    Lack of Transportation (Non-Medical): No  Physical Activity: Not on file  Stress: Not on file  Social Connections: Not on file     Family History: The patient's family history includes CAD in her father and mother; Diabetes in her maternal grandmother and paternal grandfather. ROS:   Please see the history of present illness.    All 14 point review of systems negative except as described per history of present illness  EKGs/Labs/Other Studies Reviewed:    EKG Interpretation Date/Time:  Thursday June 08 2024 12:59:32 EDT Ventricular Rate:  90 PR Interval:  160 QRS Duration:  92 QT Interval:  374 QTC Calculation: 457 R Axis:   -49  Text Interpretation: Normal sinus rhythm Left axis deviation Left ventricular hypertrophy with repolarization abnormality Cannot rule out Septal infarct (cited on or before 01-Nov-2023) Abnormal ECG When compared with ECG of 01-Nov-2023 11:32, QRS axis Shifted left Confirmed by Ralene Burger [phone]) on 06/08/2024 1:13:23 PM    Recent Labs: 04/26/2024: ALT 16 05/06/2024: BUN 11; Creatinine, Ser 0.84; Hemoglobin 15.0; Platelets 147; Potassium 3.4; Sodium 138  Recent Lipid Panel    Component Value Date/Time   CHOL 121 05/06/2024 0012   CHOL 99 (L) 12/02/2021 0926   TRIG 127 05/06/2024 0012   HDL 24 (L) 05/06/2024 0012   HDL 30 (L) 12/02/2021 0926   CHOLHDL 5.0 05/06/2024  0012   VLDL 25 05/06/2024 0012   LDLCALC 72 05/06/2024 0012   LDLCALC 45 12/02/2021 0926    Physical Exam:    VS:  BP 110/72 (BP Location: Left Arm, Patient Position: Sitting)   Pulse 90   Ht 5' 7 (1.702 m)   Wt 199 lb 9.6 oz (90.5 kg)   SpO2 96%   BMI 31.26 kg/m  Wt Readings from Last 3 Encounters:  06/08/24 199 lb 9.6 oz (90.5 kg)  05/05/24 198 lb 8 oz (90 kg)  04/26/24 198 lb 8 oz (90 kg)     GEN:  Well nourished, well developed in no acute distress HEENT: Normal NECK: No JVD; No carotid bruits LYMPHATICS: No lymphadenopathy CARDIAC: RRR, no murmurs, no rubs, no gallops RESPIRATORY:  Clear to auscultation without rales, wheezing or rhonchi  ABDOMEN: Soft, non-tender, non-distended MUSCULOSKELETAL:  No edema; No deformity  SKIN: Warm and dry LOWER EXTREMITIES: no swelling NEUROLOGIC:  Alert and oriented x 3 PSYCHIATRIC:  Normal affect   ASSESSMENT:    1. Ischemic cardiomyopathy   2. Coronary artery disease of bypass graft of native heart with stable angina pectoris (HCC)   3. Primary hypertension   4. Type 2 diabetes mellitus with hyperglycemia, with long-term current use of insulin  (HCC)   5. History of coronary artery bypass graft   6. Claudication in peripheral vascular disease (HCC)    PLAN:    In order of problems listed above:  Coronary artery disease stable from that point review no signs and symptoms of reactivation of the problem. History of cardiomyopathy last echocardiogram show ejection fraction 5055%, then being done at around the hospital.  Continue present management. Essential hypertension blood pressure well-controlled. Dyslipidemia I did review K PN show me LDL 72 HDL 24 this is from 05/06/2024, she is on high intensity statin which I will continue. Diabetes last hemoglobin A1c 10.3 obesity poorly controlled she will be referred back to her internal medicine team to get better control of diabetes. Peripheral vascular disease.  In March  improvement with left lower extremity femoral endarterectomy.  Will schedule her to have DVT study of the left leg make sure she does not have any significant DVT. Smoking obviously huge problem spent at least 5 minutes talking about this I gave her all different things how to try to quit.   Medication Adjustments/Labs and Tests Ordered: Current medicines are reviewed at length with the patient today.  Concerns regarding medicines are outlined above.  Orders Placed This Encounter  Procedures   EKG 12-Lead   Medication changes: No orders of the defined types were placed in this encounter.   Signed, Manfred Seed, MD, [facility] 06/08/2024 1:23 PM    [facility]

## 2024-06-13 NOTE — Progress Notes (Unsigned)
 POST OPERATIVE OFFICE NOTE    CC:  F/u for surgery  HPI:  This is a 48 y.o. female who is s/p left iliofemoral endarterectomy and bovine patch angioplasty by Dr. Lanis on 05/05/2024 due to critical limb ischemia with rest pain of the left foot.  Rest pain in left foot has completely resolved.  She no longer has calf claudication with ambulation.  She believes her groin incision is healing well.  She continues to smoke on a daily basis.  She denies any claudication symptoms of the right lower extremity.  She is on aspirin , statin, Plavix  daily.  No Known Allergies  Current Outpatient Medications  Medication Sig Dispense Refill   albuterol  (VENTOLIN  HFA) 108 (90 Base) MCG/ACT inhaler Inhale 2 puffs into the lungs every 6 (six) hours as needed for wheezing or shortness of breath.     aspirin  EC 81 MG tablet Take 1 tablet (81 mg total) by mouth daily. Swallow whole. 90 tablet 3   atorvastatin  (LIPITOR ) 80 MG tablet Take 1 tablet (80 mg total) by mouth daily. 90 tablet 3   carvedilol  (COREG ) 12.5 MG tablet Take 2 tablets (25 mg total) by mouth 2 (two) times daily with a meal. (Patient taking differently: Take 25 mg by mouth daily.) 180 tablet 1   clopidogrel  (PLAVIX ) 75 MG tablet Take 75 mg by mouth daily.     Continuous Glucose Receiver (FREESTYLE LIBRE 2 READER) DEVI every 14 (fourteen) days.     Continuous Glucose Sensor (FREESTYLE LIBRE 2 PLUS SENSOR) MISC every 14 (fourteen) days.     empagliflozin  (JARDIANCE ) 25 MG TABS tablet Take 25 mg by mouth daily.     gabapentin  (NEURONTIN ) 100 MG capsule Take 100 mg by mouth 3 (three) times daily.     glipiZIDE  (GLUCOTROL  XL) 10 MG 24 hr tablet Take 10 mg by mouth daily.     losartan  (COZAAR ) 50 MG tablet Take 1 tablet (50 mg total) by mouth daily. 90 tablet 3   metFORMIN  (GLUCOPHAGE -XR) 500 MG 24 hr tablet Take 2 tablets (1,000 mg total) by mouth 2 (two) times daily.     nicotine  (NICODERM CQ  - DOSED IN MG/24 HOURS) 21 mg/24hr patch Place 21 mg  onto the skin daily.     nitroGLYCERIN  (NITROSTAT ) 0.4 MG SL tablet DISSOLVE ONE TABLET UNDER THE TONGUE EVERY 5 MINUTES AS NEEDED 25 tablet 0   NOVOLIN 70/30 KWIKPEN (70-30) 100 UNIT/ML KwikPen Inject 30 Units into the skin in the morning and at bedtime.     oxyCODONE -acetaminophen  (PERCOCET) 5-325 MG tablet Take 1 tablet by mouth every 4 (four) hours as needed for severe pain (pain score 7-10). 40 tablet 0   Semaglutide (OZEMPIC, 0.25 OR 0.5 MG/DOSE, [place]) Inject 0.5 mg into the skin once a week.     torsemide  (DEMADEX ) 20 MG tablet Take 20 mg by mouth daily.     No current facility-administered medications for this visit.     ROS:  See HPI  Physical Exam:  Vitals:   06/15/24 1350  BP: (!) 182/101  Pulse: 92  Temp: 97.7 F (36.5 C)  TempSrc: Temporal  SpO2: 92%  Weight: 203 lb 9.6 oz (92.4 kg)  Height: 5' 7 (1.702 m)    Incision:  L groin healing well Extremities:  palpable L DP; L ankle edema  Assessment/Plan:  This is a 48 y.o. female who is s/p: Left iliofemoral endarterectomy with bovine patch angioplasty due to critical limb ischemia with rest pain  Left foot well-perfused with  palpable DP pulse.  Left groin incision also well-appearing.  Staples were removed today.  Continue aspirin , Plavix , statin daily.  Encouraged ambulation.  Encouraged her to practice normal hygiene with soap and water  over the groin incision daily, then pat dry until completely healed.  She will return to the office in 6 months with a repeat ABI.   Donnice Sender, PA-C [facility] [phone]  Clinic MD:  [place]

## 2024-06-14 NOTE — Telephone Encounter (Signed)
 Orders faxed to [facility] from Dr. Samara Crest

## 2024-06-15 ENCOUNTER — Ambulatory Visit (HOSPITAL_COMMUNITY)
Admission: RE | Admit: 2024-06-15 | Discharge: 2024-06-15 | Disposition: A | Discharge status: Home / Self Care | Source: Ambulatory Visit | Attending: Vascular Surgery | Admitting: Vascular Surgery

## 2024-06-15 ENCOUNTER — Ambulatory Visit: Attending: Vascular Surgery | Admitting: Physician Assistant

## 2024-06-15 VITALS — BP 182/101 | HR 92 | Temp 97.7°F | Ht 67.0 in | Wt 203.6 lb

## 2024-06-15 DIAGNOSIS — I739 Peripheral vascular disease, unspecified: Secondary | ICD-10-CM

## 2024-06-15 LAB — VAS US ABI WITH/WO TBI
Left ABI: 0.88
Right ABI: 0.67

## 2024-06-16 ENCOUNTER — Other Ambulatory Visit: Payer: Self-pay | Admitting: *Deleted

## 2024-06-16 DIAGNOSIS — I739 Peripheral vascular disease, unspecified: Secondary | ICD-10-CM

## 2024-07-24 ENCOUNTER — Other Ambulatory Visit: Payer: Self-pay | Admitting: Medical Genetics

## 2024-07-24 ENCOUNTER — Encounter: Payer: Self-pay | Admitting: Cardiology

## 2024-07-25 ENCOUNTER — Other Ambulatory Visit: Payer: Self-pay

## 2024-07-25 DIAGNOSIS — I639 Cerebral infarction, unspecified: Secondary | ICD-10-CM | POA: Insufficient documentation

## 2024-07-25 NOTE — Progress Notes (Addendum)
 " Cardiology Office Note   Date:  07/27/2024  ID:  Donna, Tate 04-19-1976, MRN [id number] PCP: Donna Elston NOVAK, NP  [facility] Providers Cardiologist:  Donna Fitch, MD     History of Present Illness [name] is a 48 y.o. female with a past medical history of CAD, history of stroke, PAD Dr. Lanis, hypertension, HFrEF, DM2 follows with endocrinology at [facility], polyneuropathy, dyslipidemia, tobacco abuse.  05/05/2024 left iliofemoral endarterectomy and bovine patch angioplasty secondary to critical limb ischemia 01/05/2024 echo EF 50 to 55%, concentric LVH, moderate MR 10/02/2023 MPI no ischemic changes 06/07/2020 cardiac cath occluded SVG to RCA, patent LIMA to LAD, no target for PCI 06/08/2020 echo EF 40 to 45%, global hypokinesis, mild concentric LVH, grade 2 DD LA moderately dilated, mild MR 2017 CABG x 2  She is a longstanding patient of Dr. Krasowski for the management of heart failure, CAD and PAD.  In 2017 she underwent a CABG x 2.  In 2021 she underwent left heart cath revealing occluded SVG to RCA, patent LIMA to LAD with no target site for PCI.  In October 2024 she underwent stress evaluation which was negative for ischemia.  In May 2025 she underwent a left iliofemoral endarterectomy and bovine patch secondary to critical limb ischemia and is followed by Dr. Silver with [facility].  Most recently she was evaluated by Dr. Fitch on 06/08/2024, still recovering from her recent vascular surgery, advised to follow-up in 5 months.  She presents today with concern of abdominal swelling and shortness of breath, that has been occurring for the last 2 weeks.  She has not had any significant weight change.  Blood pressure has been very elevated the last few times it has been checked in office setting.  She does not have access to check her blood pressure at home.  She follows with an endocrinologist at [facility].  She has not noticed any pedal edema, possibly some orthopnea at night.  She denies chest pain, palpitations, dyspnea, pnd,  n, v, dizziness, syncope, edema, weight gain, or early satiety.   ROS: Review of Systems  Respiratory:  Positive for shortness of breath.   Cardiovascular:  Positive for orthopnea.  All other systems reviewed and are negative.    Studies Reviewed      Cardiac Studies & Procedures   ______________________________________________________________________________________________ CARDIAC CATHETERIZATION  CARDIAC CATHETERIZATION 06/07/2020  Conclusion  Ost LAD to Prox LAD lesion is 100% stenosed.  2nd Mrg lesion is 70% stenosed.  Prox RCA to Mid RCA lesion is 100% stenosed.  SVG graft was visualized by angiography.  Origin to Prox Graft lesion is 100% stenosed.  LIMA graft was visualized by angiography and is large.  The graft exhibits no disease.  LV end diastolic pressure is mildly elevated.  1. 3 vessel obstructive CAD - 100% proximal LAD - 70% mid to distal OM2 - 100% proximal RCA 2. Occluded SVG to RCA 3. Patent LIMA to LAD 4. Mildly elevated LVEDP  Plan: optimal medical therapy and risk factor modification. No targets for PCI. RCA does have some collaterals.  Findings Coronary Findings Diagnostic  Dominance: Right  Left Main Vessel was injected. Vessel is normal in caliber. Vessel is angiographically normal.  Left Anterior Descending Ost LAD to Prox LAD lesion is 100% stenosed.  Left Circumflex Vessel was injected. Vessel is large. There is moderate diffuse disease throughout the vessel.  Second Obtuse Marginal Branch 2nd Mrg lesion is 70% stenosed.  Right Coronary Artery Prox RCA to Mid  RCA lesion is 100% stenosed.  Right Posterior Descending Artery Collaterals RPDA filled by collaterals from Dist LAD.  Right Posterior Atrioventricular Artery Collaterals RPAV filled by collaterals from Dist Cx.  Saphenous Graft To Dist RCA SVG graft was visualized by angiography. Origin to Prox Graft lesion  is 100% stenosed.  LIMA LIMA Graft To Mid LAD LIMA graft was visualized by angiography and is large. The graft exhibits no disease.  Intervention  No interventions have been documented.   STRESS TESTS  MYOCARDIAL PERFUSION IMAGING 10/02/2023   ECHOCARDIOGRAM  ECHOCARDIOGRAM COMPLETE 03/10/2022  Narrative ECHOCARDIOGRAM REPORT    Patient Name:   [name] Date of Exam: 03/10/2022 Medical Rec #:  [id number]    Height:       67.0 in Accession #:    [id number]   Weight:       212.0 lb Date of Birth:  10-Oct-1976   BSA:          2.073 m Patient Age:    45 years     BP:           142/76 mmHg Patient Gender: F            HR:           78 bpm. Exam Location:  [place]  Procedure: 2D Echo, Cardiac Doppler, Color Doppler and Strain Analysis  Indications:    Coronary artery disease of bypass graft of native heart with stable angina pectoris (HCC) [I25.708 (ICD-10-CM)]; Type 2 diabetes mellitus with hyperglycemia, with long-term current use of insulin  (HCC) [E11.65, Z79.4 (ICD-10-CM)]; Tobacco abuse [Z72.0 (ICD-10-CM)]; History of coronary artery bypass graft [Z95.1 (ICD-10-CM)]; Claudication in peripheral vascular disease (HCC) [I73.9 (ICD-10-CM)]; Ischemic cardiomyopathy [I25.5 (ICD-10-CM)]  History:        Patient has prior history of Echocardiogram examinations, most recent 06/08/2020. Cardiomyopathy, Previous Myocardial Infarction and CAD; Risk Factors:Dyslipidemia.  Sonographer:    Charlie Jointer RDCS Referring Phys: [id number] ROBERT J [name]  IMPRESSIONS   1. Left ventricular ejection fraction, by estimation, is 45 to 50%. The left ventricle has mildly decreased function. The left ventricle has no regional wall motion abnormalities. The left ventricular internal cavity size was moderately dilated. Left ventricular diastolic parameters are consistent with Grade II diastolic dysfunction (pseudonormalization). Elevated left atrial pressure. 2. Right ventricular systolic  function is mildly reduced. The right ventricular size is normal. There is normal pulmonary artery systolic pressure. 3. Left atrial size was mildly dilated. 4. The mitral valve is normal in structure. Mild mitral valve regurgitation. No evidence of mitral stenosis. 5. The aortic valve is tricuspid. Aortic valve regurgitation is not visualized. No aortic stenosis is present. 6. The inferior vena cava is normal in size with greater than 50% respiratory variability, suggesting right atrial pressure of 3 mmHg.  FINDINGS Left Ventricle: Left ventricular ejection fraction, by estimation, is 45 to 50%. The left ventricle has mildly decreased function. The left ventricle has no regional wall motion abnormalities. Global longitudinal strain performed but not reported based on interpreter judgement due to suboptimal tracking. The left ventricular internal cavity size was moderately dilated. There is no left ventricular hypertrophy. Left ventricular diastolic parameters are consistent with Grade II diastolic dysfunction (pseudonormalization). Elevated left atrial pressure.  Right Ventricle: The right ventricular size is normal. No increase in right ventricular wall thickness. Right ventricular systolic function is mildly reduced. There is normal pulmonary artery systolic pressure. The tricuspid regurgitant velocity is 2.64 m/s, and with an assumed right atrial pressure of 3 mmHg, the  estimated right ventricular systolic pressure is 30.9 mmHg.  Left [facility]: Left atrial size was mildly dilated.  Right [facility]: Right atrial size was normal in size.  Pericardium: There is no evidence of pericardial effusion.  Mitral Valve: The mitral valve is normal in structure. Mild mitral valve regurgitation. No evidence of mitral valve stenosis.  Tricuspid Valve: The tricuspid valve is normal in structure. Tricuspid valve regurgitation is mild . No evidence of tricuspid stenosis.  Aortic Valve: The aortic valve is  tricuspid. Aortic valve regurgitation is not visualized. No aortic stenosis is present.  Pulmonic Valve: The pulmonic valve was normal in structure. Pulmonic valve regurgitation is not visualized. No evidence of pulmonic stenosis.  Aorta: The aortic root and ascending aorta are structurally normal, with no evidence of dilitation and the aortic arch was not well visualized.  Venous: The pulmonary veins were not well visualized. The inferior vena cava is normal in size with greater than 50% respiratory variability, suggesting right atrial pressure of 3 mmHg.  IAS/Shunts: No atrial level shunt detected by color flow Doppler.   LEFT VENTRICLE PLAX 2D LVIDd:         5.80 cm      Diastology LVIDs:         4.30 cm      LV e' medial:    5.44 cm/s LV PW:         0.90 cm      LV E/e' medial:  19.3 LV IVS:        0.70 cm      LV e' lateral:   10.80 cm/s LV E/e' lateral: 9.7  LV Volumes (MOD)            2D Longitudinal Strain LV vol d, MOD A2C: 157.0 ml 2D Strain GLS Avg:     -14.5 % LV vol d, MOD A4C: 146.0 ml LV vol s, MOD A2C: 80.2 ml LV vol s, MOD A4C: 76.9 ml LV SV MOD A2C:     76.8 ml LV SV MOD A4C:     146.0 ml LV SV MOD BP:      71.7 ml  RIGHT VENTRICLE            IVC RV Basal diam:  3.40 cm    IVC diam: 2.00 cm RV S prime:     6.74 cm/s TAPSE (M-mode): 1.3 cm  LEFT ATRIUM             Index        RIGHT ATRIUM           Index LA diam:        5.10 cm 2.46 cm/m   RA Area:     10.20 cm LA Vol (A2C):   74.9 ml 36.13 ml/m  RA Volume:   19.90 ml  9.60 ml/m LA Vol (A4C):   65.0 ml 31.36 ml/m LA Biplane Vol: 70.5 ml 34.01 ml/m AORTIC VALVE LVOT Vmax:   90.80 cm/s LVOT Vmean:  66.300 cm/s LVOT VTI:    0.209 m  AORTA Ao Root diam: 2.90 cm Ao Asc diam:  2.70 cm  MITRAL VALVE                TRICUSPID VALVE MV Area (PHT): 3.48 cm     TR Peak grad:   27.9 mmHg MV Decel Time: 218 msec     TR Vmax:        264.00 cm/s MR Peak grad: 99.0 mmHg MR Vmax:  497.50 cm/s   SHUNTS MV  E velocity: 105.00 cm/s  Systemic VTI: 0.21 m MV A velocity: 66.20 cm/s MV E/A ratio:  1.59  Redell Leiter MD Electronically signed by Redell Leiter MD Signature Date/Time: 03/10/2022/4:56:59 PM    Final    MONITORS  LONG TERM MONITOR (3-14 DAYS) 09/16/2022  Narrative Patch Wear Time:  14 days and 0 hours (2023-09-08T16:01:38-0400 to 2023-09-22T16:01:42-0400)  Patient had a min HR of 55 bpm, max HR of 133 bpm, and avg HR of 87 bpm. Predominant underlying rhythm was Sinus Rhythm. 1 run of Supraventricular Tachycardia occurred lasting 5 beats with a max rate of 133 bpm (avg 123 bpm). Isolated SVEs were rare (<1.0%), SVE Couplets were rare (<1.0%), and SVE Triplets were rare (<1.0%). Isolated VEs were rare (<1.0%), VE Couplets were rare (<1.0%), and no VE Triplets were present. Ventricular Bigeminy and Trigeminy were present.  Summary and conclusions: 1 run of supraventricular tachycardia 5 beats at rate of 133. Rare ventricular ectopy noted with bigeminy and trigeminy. Multiple trigger events showing sinus rhythm       ______________________________________________________________________________________________      Risk Assessment/Calculations    Physical Exam VS:  BP (!) 170/88   Pulse 96   Ht 5' 7 (1.702 m)   Wt 202 lb (91.6 kg)   SpO2 96%   BMI 31.64 kg/m        Wt Readings from Last 3 Encounters:  07/26/24 202 lb (91.6 kg)  06/15/24 203 lb 9.6 oz (92.4 kg)  06/08/24 199 lb 9.6 oz (90.5 kg)    GEN: Well nourished, well developed in no acute distress NECK: No JVD; No carotid bruits CARDIAC: RRR, 2/6 systolic murmur LSB, rubs, gallops RESPIRATORY: Trace rales appreciated ABDOMEN: Soft, non-tender, non-distended EXTREMITIES:  No edema; No deformity   ASSESSMENT AND PLAN HFmrEF -NYHA class II, the trace rales were appreciated and she is feeling swollen in her abdomen.  Her weight is unchanged.  She is having episodes of orthopnea at times.  Continue Coreg  but we  will increase to 25 mg twice daily, currently on Jardiance  25 mg daily, continue losartan  but will increase to 50 mg twice daily as her blood pressure is also uncontrolled, continue Demadex  20 mg daily.  Will check c-Met, proBNP.  Repeat echo.  Hypertension-blood pressure is uncontrolled today and has been at the last few office visits that I can view in epic.  Will increase her losartan  to 50 mg twice daily, will increase her Coreg  to 25 mg twice daily.  Prescription to get a blood pressure cuff and have her keep a blood pressure log.  In the interim she will come in for a nurse visit to validate her current blood pressure cuff which is a wrist monitor.    Moderate mitral regurgitation-will repeat echo.  PAD-follows with vascular in [place].  DM2 -follows with endocrinology at [facility]  Dispo: Increase losartan  to 50 mg twice daily, increase Coreg  to 25 mg twice daily, echo, c-Met, proBNP, TSH, prescription given for blood pressure cuff, blood pressure log, nurse visit to validate her current blood pressure cuff (currently using a wrist cuff, in case she is not able to get an arm blood pressure cuff).  Follow up in 3 to 4 weeks.  Signed, Delon JAYSON Hoover, NP  "

## 2024-07-26 ENCOUNTER — Ambulatory Visit: Attending: Cardiology | Admitting: Cardiology

## 2024-07-26 ENCOUNTER — Encounter: Payer: Self-pay | Admitting: Cardiology

## 2024-07-26 VITALS — BP 170/88 | HR 96 | Ht 67.0 in | Wt 202.0 lb

## 2024-07-26 DIAGNOSIS — I255 Ischemic cardiomyopathy: Secondary | ICD-10-CM | POA: Diagnosis present

## 2024-07-26 DIAGNOSIS — E785 Hyperlipidemia, unspecified: Secondary | ICD-10-CM

## 2024-07-26 DIAGNOSIS — I25708 Atherosclerosis of coronary artery bypass graft(s), unspecified, with other forms of angina pectoris: Secondary | ICD-10-CM | POA: Diagnosis present

## 2024-07-26 DIAGNOSIS — E1165 Type 2 diabetes mellitus with hyperglycemia: Secondary | ICD-10-CM | POA: Diagnosis present

## 2024-07-26 DIAGNOSIS — I739 Peripheral vascular disease, unspecified: Secondary | ICD-10-CM | POA: Diagnosis present

## 2024-07-26 DIAGNOSIS — Z720 Tobacco use: Secondary | ICD-10-CM | POA: Diagnosis present

## 2024-07-26 DIAGNOSIS — R0609 Other forms of dyspnea: Secondary | ICD-10-CM

## 2024-07-26 DIAGNOSIS — Z794 Long term (current) use of insulin: Secondary | ICD-10-CM | POA: Diagnosis present

## 2024-07-26 DIAGNOSIS — I200 Unstable angina: Secondary | ICD-10-CM

## 2024-07-26 MED ORDER — LOSARTAN POTASSIUM 50 MG PO TABS: 50.0000 mg | ORAL_TABLET | Freq: Two times a day (BID) | ORAL | 3 refills | Status: DC

## 2024-07-26 MED ORDER — BLOOD PRESSURE CUFF MISC: 1.0000 [IU] | Freq: Every day | 0 refills | Status: DC

## 2024-07-26 MED ORDER — CARVEDILOL 25 MG PO TABS: 25.0000 mg | ORAL_TABLET | Freq: Two times a day (BID) | ORAL | 3 refills | Status: DC

## 2024-07-26 NOTE — Patient Instructions (Addendum)
 Medication Instructions:  Your physician has recommended you make the following change in your medication:   Increase your Losartan  to 50 mg twice daily.  Increase your Carvedilol  to 25 mg twice daily.   *If you need a refill on your cardiac medications before your next appointment, please call your pharmacy*   Lab Work: Your physician recommends that you have a CMP, CBC, ProBNP and thyroid  panel today in the office.  If you have labs (blood work) drawn today and your tests are completely normal, you will receive your results only by: MyChart Message (if you have MyChart) OR A paper copy in the mail If you have any lab test that is abnormal or we need to change your treatment, we will call you to review the results.   Testing/Procedures: Your physician has requested that you have an echocardiogram. Echocardiography is a painless test that uses sound waves to create images of your heart. It provides your doctor with information about the size and shape of your heart and how well your heart's chambers and valves are working. This procedure takes approximately one hour. There are no restrictions for this procedure. Please do NOT wear cologne, perfume, aftershave, or lotions (deodorant is allowed). Please arrive 15 minutes prior to your appointment time.  Please note: We ask at that you not bring children with you during ultrasound (echo/ vascular) testing. Due to room size and safety concerns, children are not allowed in the ultrasound rooms during exams. Our front office staff cannot provide observation of children in our lobby area while testing is being conducted. An adult accompanying a patient to their appointment will only be allowed in the ultrasound room at the discretion of the ultrasound technician under special circumstances. We apologize for any inconvenience.    Follow-Up: At [facility], you and your health needs are our priority.  As part of our continuing mission  to provide you with exceptional heart care, we have created designated Provider Care Teams.  These Care Teams include your primary Cardiologist (physician) and Advanced Practice Providers (APPs -  Physician Assistants and Nurse Practitioners) who all work together to provide you with the care you need, when you need it.  We recommend signing up for the patient portal called MyChart.  Sign up information is provided on this After Visit Summary.  MyChart is used to connect with patients for Virtual Visits (Telemedicine).  Patients are able to view lab/test results, encounter notes, upcoming appointments, etc.  Non-urgent messages can be sent to your provider as well.   To learn more about what you can do with MyChart, go to [email or web address].    Your next appointment:   3-4 weeks   The format for your next appointment:   In Person  Provider:   Delon Hoover, NP [place])    Other Instructions none  Important Information About Sugar

## 2024-07-27 ENCOUNTER — Ambulatory Visit: Attending: Student

## 2024-07-27 ENCOUNTER — Telehealth: Payer: Self-pay

## 2024-07-27 ENCOUNTER — Ambulatory Visit: Payer: Self-pay | Admitting: Cardiology

## 2024-07-27 DIAGNOSIS — I340 Nonrheumatic mitral (valve) insufficiency: Secondary | ICD-10-CM

## 2024-07-27 DIAGNOSIS — I10 Essential (primary) hypertension: Secondary | ICD-10-CM

## 2024-07-27 LAB — COMPREHENSIVE METABOLIC PANEL WITH GFR
ALT: 9 IU/L (ref 0–32)
AST: 13 IU/L (ref 0–40)
Albumin: 4 g/dL (ref 3.9–4.9)
Alkaline Phosphatase: 79 IU/L (ref 44–121)
BUN/Creatinine Ratio: 13 (ref 9–23)
BUN: 11 mg/dL (ref 6–24)
Bilirubin Total: 0.4 mg/dL (ref 0.0–1.2)
CO2: 23 mmol/L (ref 20–29)
Calcium: 9.3 mg/dL (ref 8.7–10.2)
Chloride: 100 mmol/L (ref 96–106)
Creatinine, Ser: 0.83 mg/dL (ref 0.57–1.00)
Globulin, Total: 2.2 g/dL (ref 1.5–4.5)
Glucose: 298 mg/dL — ABNORMAL HIGH (ref 70–99)
Potassium: 4.4 mmol/L (ref 3.5–5.2)
Sodium: 138 mmol/L (ref 134–144)
Total Protein: 6.2 g/dL (ref 6.0–8.5)
eGFR: 86 mL/min/1.73

## 2024-07-27 LAB — THYROID PANEL WITH TSH
Free Thyroxine Index: 2.6 (ref 1.2–4.9)
T3 Uptake Ratio: 31 % (ref 24–39)
T4, Total: 8.4 ug/dL (ref 4.5–12.0)
TSH: 1.19 u[IU]/mL (ref 0.450–4.500)

## 2024-07-27 LAB — CBC
Hematocrit: 51.8 % — ABNORMAL HIGH (ref 34.0–46.6)
Hemoglobin: 16.1 g/dL — ABNORMAL HIGH (ref 11.1–15.9)
MCH: 27.1 pg (ref 26.6–33.0)
MCHC: 31.1 g/dL — ABNORMAL LOW (ref 31.5–35.7)
MCV: 87 fL (ref 79–97)
Platelets: 162 x10E3/uL (ref 150–450)
RBC: 5.94 x10E6/uL — ABNORMAL HIGH (ref 3.77–5.28)
RDW: 13.6 % (ref 11.7–15.4)
WBC: 8.9 x10E3/uL (ref 3.4–10.8)

## 2024-07-27 LAB — PRO B NATRIURETIC PEPTIDE: NT-Pro BNP: 1251 pg/mL — ABNORMAL HIGH (ref 0–249)

## 2024-07-27 NOTE — Telephone Encounter (Signed)
 Pt viewed results on My Chart per Delon Hoover, NP's note. Routed to PCP.

## 2024-07-27 NOTE — Telephone Encounter (Signed)
 Left MY Chart message with message from Delon Hoover, NP

## 2024-08-01 ENCOUNTER — Telehealth: Payer: Self-pay

## 2024-08-01 NOTE — Telephone Encounter (Signed)
 Patient send MyChart message asking about pain in her left inner thigh, about a foot below her groin incision.  Returned patient's call to discuss her symptoms. Pt reports pressure and pain down her left inner thigh when she leans forward and when standing up, sitting down and if standing a long period of time. Pt reports no visual signs of infection, no swelling, no change in temperature or color of her leg and/or foot. Pt knows she can use a warm compress to help with pain.  She reported heat does help the pain.  Referred to her PCP for pain assessment.  Pt reported she has appt on 08/02/24.  Pt knows she can call back if the pain doesn't resolve.

## 2024-08-02 ENCOUNTER — Encounter: Payer: Self-pay | Admitting: Vascular Surgery

## 2024-08-02 ENCOUNTER — Other Ambulatory Visit (HOSPITAL_COMMUNITY): Payer: Self-pay | Admitting: Vascular Surgery

## 2024-08-02 ENCOUNTER — Other Ambulatory Visit (HOSPITAL_COMMUNITY): Payer: Self-pay

## 2024-08-02 ENCOUNTER — Ambulatory Visit: Attending: Vascular Surgery | Admitting: Vascular Surgery

## 2024-08-02 ENCOUNTER — Ambulatory Visit (HOSPITAL_BASED_OUTPATIENT_CLINIC_OR_DEPARTMENT_OTHER)
Admission: RE | Admit: 2024-08-02 | Discharge: 2024-08-02 | Disposition: A | Discharge status: Home / Self Care | Source: Ambulatory Visit | Attending: Vascular Surgery | Admitting: Vascular Surgery

## 2024-08-02 ENCOUNTER — Encounter (HOSPITAL_BASED_OUTPATIENT_CLINIC_OR_DEPARTMENT_OTHER): Payer: Self-pay | Admitting: Nurse Practitioner

## 2024-08-02 ENCOUNTER — Ambulatory Visit (HOSPITAL_COMMUNITY)
Admission: RE | Admit: 2024-08-02 | Discharge: 2024-08-02 | Disposition: A | Discharge status: Home / Self Care | Source: Ambulatory Visit | Attending: Vascular Surgery | Admitting: Vascular Surgery

## 2024-08-02 VITALS — BP 143/76 | HR 76 | Temp 98.0°F | Resp 18 | Ht 67.0 in | Wt 201.4 lb

## 2024-08-02 DIAGNOSIS — M542 Cervicalgia: Secondary | ICD-10-CM

## 2024-08-02 DIAGNOSIS — M79604 Pain in right leg: Secondary | ICD-10-CM | POA: Diagnosis present

## 2024-08-02 DIAGNOSIS — I739 Peripheral vascular disease, unspecified: Secondary | ICD-10-CM | POA: Insufficient documentation

## 2024-08-02 DIAGNOSIS — M79605 Pain in left leg: Secondary | ICD-10-CM | POA: Insufficient documentation

## 2024-08-02 DIAGNOSIS — I70229 Atherosclerosis of native arteries of extremities with rest pain, unspecified extremity: Secondary | ICD-10-CM

## 2024-08-02 LAB — VAS US ABI WITH/WO TBI
Left ABI: 0.62
Right ABI: 0.72

## 2024-08-02 NOTE — Progress Notes (Signed)
 Subjective:     Patient ID: [name], female   DOB: [birth year], 48 y.o.   MRN: [id number]  HPI 48 year old female with history of recent left iliofemoral endarterectomy for occluded left common femoral artery.  This was done for rest pain.  She states that she has had medial thigh pain and she is here today with DVT for further evaluation.  Her left foot is not having any pain.  She continues to smoke 1-1/2 packs/day.  She remains on aspirin  and Plavix .   Review of Systems Left medial leg pain    Objective:   Physical Exam Vitals:   08/02/24 1423  BP: (!) 143/76  Pulse: 76  Resp: 18  Temp: 98 F (36.7 C)  SpO2: 96%   Awake alert and oriented I cannot readily feel the left common femoral pulse, the incision is well-healed No abnormalities of the left medial thigh Capillary refill of the left foot is delayed     LEFT     CompressibilityPhasicitySpontaneityPropertiesThrombus  Aging  +---------+---------------+---------+-----------+----------+--------------+   CFV     Full           Yes      Yes                                   +---------+---------------+---------+-----------+----------+--------------+   SFJ     Full                                                          +---------+---------------+---------+-----------+----------+--------------+   FV Prox  Full                                                          +---------+---------------+---------+-----------+----------+--------------+   FV Mid   Full                                                          +---------+---------------+---------+-----------+----------+--------------+   FV DistalFull                                                          +---------+---------------+---------+-----------+----------+--------------+   PFV     Full                                                           +---------+---------------+---------+-----------+----------+--------------+   POP     Full           Yes      Yes                                   +---------+---------------+---------+-----------+----------+--------------+  PTV     Full                                                          +---------+---------------+---------+-----------+----------+--------------+   PERO    Full                                                          +---------+---------------+---------+-----------+----------+--------------+         Summary:   LEFT:  - There is no evidence of deep vein thrombosis in the lower extremity.    - No cystic structure found in the popliteal fossa.    - Incidental finding: patient with left femoral endarterectomy 05/05/2024.  Common femoral artery at endarterectomy site noted to be partially  thrombosed without evidence of hemodynamically significant stenosis. Given  this finding, brief arterial  evaluation was performed. Superficial femoral artery appears occluded with  reconstitution of the popliteal artery via collaterals. Dampened  monophasic three vessel runoff.     ABI Findings:  +---------+------------------+-----+----------+--------+  Right   Rt Pressure (mmHg)IndexWaveform  Comment   +---------+------------------+-----+----------+--------+  Brachial 118                                        +---------+------------------+-----+----------+--------+  ATA     94                0.72 monophasic          +---------+------------------+-----+----------+--------+  PTA     88                0.68 monophasic          +---------+------------------+-----+----------+--------+  Great Toe74                0.57                     +---------+------------------+-----+----------+--------+   +---------+------------------+-----+----------+-------+  Left    Lt Pressure (mmHg)IndexWaveform  Comment   +---------+------------------+-----+----------+-------+  Brachial 130                                       +---------+------------------+-----+----------+-------+  ATA     80                0.62 monophasic         +---------+------------------+-----+----------+-------+  PTA     70                0.54 monophasic         +---------+------------------+-----+----------+-------+  Great Toe20                0.15                    +---------+------------------+-----+----------+-------+   +-------+-----------+-----------+------------+------------+  ABI/TBIToday's ABIToday's TBIPrevious ABIPrevious TBI  +-------+-----------+-----------+------------+------------+  Right 0.72       0.57       0.67        0.47          +-------+-----------+-----------+------------+------------+  Left  0.62       0.15       0.88        0.59          +-------+-----------+-----------+------------+------------+         Right ABIs and TBIs appear essentially unchanged compared to prior study  on 06/15/2024. Left ABIs and TBIs appear decreased compared to prior study  on 06/15/2024.    Summary:  Right: Resting right ankle-brachial index indicates moderate right lower  extremity arterial disease. The right toe-brachial index is abnormal.   Left: Resting left ankle-brachial index indicates moderate left lower  extremity arterial disease. The left toe-brachial index is abnormal.      Assessment/plan     48 year old female here with left medial thigh pain after left iliofemoral endarterectomy.  On today's evaluation of DVT study she was found to have a subtotal occlusion of the left common femoral artery and the pictures are quite ominous.  Thankfully her foot remains well-perfused although her toe pressure is down to 20 and she has delayed capillary refill.  I discussed with the patient the need for urgent smoking cessation and she states that she has been placed on Chantix  and hopefully can cut down from 1-1/2 packs/day.  She will continue aspirin  and Plavix .  Given her recent intervention with Dr. Silver I will have her follow-up in approximately 3 to 4 weeks with CT angio for further evaluation to discuss options moving forward.  All questions were answered she demonstrates good understanding.   Vernessa Likes C. Sheree, MD [facility] Office: [phone] Pager: [phone]

## 2024-08-03 ENCOUNTER — Other Ambulatory Visit (HOSPITAL_BASED_OUTPATIENT_CLINIC_OR_DEPARTMENT_OTHER): Payer: Self-pay | Admitting: Nurse Practitioner

## 2024-08-03 DIAGNOSIS — G8929 Other chronic pain: Secondary | ICD-10-CM

## 2024-08-03 DIAGNOSIS — M542 Cervicalgia: Secondary | ICD-10-CM

## 2024-08-04 ENCOUNTER — Other Ambulatory Visit: Payer: Self-pay

## 2024-08-04 DIAGNOSIS — I70229 Atherosclerosis of native arteries of extremities with rest pain, unspecified extremity: Secondary | ICD-10-CM

## 2024-08-07 ENCOUNTER — Ambulatory Visit: Payer: Self-pay | Admitting: Cardiology

## 2024-08-08 ENCOUNTER — Other Ambulatory Visit (HOSPITAL_COMMUNITY): Payer: Self-pay

## 2024-08-15 NOTE — Telephone Encounter (Signed)
 Pt orders signed and faxed back to [facility] as requested

## 2024-08-16 ENCOUNTER — Ambulatory Visit: Attending: Cardiology

## 2024-08-16 DIAGNOSIS — R0609 Other forms of dyspnea: Secondary | ICD-10-CM | POA: Diagnosis present

## 2024-08-17 LAB — ECHOCARDIOGRAM COMPLETE
Area-P 1/2: 3.77 cm2
MV M vel: 5.27 m/s
MV Peak grad: 111.1 mmHg
Radius: 0.65 cm
S' Lateral: 4.2 cm

## 2024-08-18 ENCOUNTER — Ambulatory Visit (HOSPITAL_COMMUNITY)
Admission: RE | Admit: 2024-08-18 | Discharge: 2024-08-18 | Disposition: A | Discharge status: Home / Self Care | Source: Ambulatory Visit | Attending: Vascular Surgery | Admitting: Vascular Surgery

## 2024-08-18 DIAGNOSIS — I70229 Atherosclerosis of native arteries of extremities with rest pain, unspecified extremity: Secondary | ICD-10-CM | POA: Insufficient documentation

## 2024-08-18 MED ORDER — IOHEXOL 350 MG/ML SOLN
100.0000 mL | Freq: Once | INTRAVENOUS | Status: AC | PRN
  Administered 2024-08-18: 100 mL via INTRAVENOUS

## 2024-08-18 NOTE — Telephone Encounter (Signed)
-----   Message from Delon JAYSON Hoover sent at 08/18/2024  7:47 AM EDT ----- Echocardiogram shows that her heart is squeezing normally.  She has severe mitral regurgitation or leaking around her mitral valve.  Please refer her to the Structural Heart Team for severe mitral regurgitation.   ----- Message ----- From: Interface, Three One Seven Sent: 08/17/2024   5:36 PM EDT To: Delon JAYSON Hoover, NP

## 2024-08-18 NOTE — Telephone Encounter (Signed)
 Results reviewed with pt as per Wallis Bamberg NP's note.  Pt verbalized understanding and had no additional questions. Routed to PCP.

## 2024-08-21 NOTE — Progress Notes (Unsigned)
 Cardiology Office Note   Date:  08/21/2024  ID:  [name], DOB July 15, 1976, MRN [id number] PCP: Duwaine Burnard Amble, NP  [facility] Providers Cardiologist:  Lamar Fitch, MD     History of Present Illness [name] is a 48 y.o. female with a past medical history of CAD, history of stroke, PAD Dr. Lanis, hypertension, HFrEF, DM2 follows with endocrinology at [facility], polyneuropathy, dyslipidemia, tobacco abuse.  08/16/2024 echo 55 to 60%, severe MR  05/05/2024 left iliofemoral endarterectomy and bovine patch angioplasty secondary to critical limb ischemia 01/05/2024 echo EF 50 to 55%, concentric LVH, moderate MR 10/02/2023 MPI no ischemic changes 06/07/2020 cardiac cath occluded SVG to RCA, patent LIMA to LAD, no target for PCI 06/08/2020 echo EF 40 to 45%, global hypokinesis, mild concentric LVH, grade 2 DD LA moderately dilated, mild MR 2017 CABG x 2  She is a longstanding patient of Dr. Krasowski for the management of heart failure, CAD and PAD.  In 2017 she underwent a CABG x 2.  In 2021 she underwent left heart cath revealing occluded SVG to RCA, patent LIMA to LAD with no target site for PCI.  In October 2024 she underwent stress evaluation which was negative for ischemia.  In May 2025 she underwent a left iliofemoral endarterectomy and bovine patch secondary to critical limb ischemia and is followed by Dr. Silver with [facility].  Most recently she was evaluated by Dr. Fitch on 06/08/2024, still recovering from her recent vascular surgery, advised to follow-up in 5 months.  Most recently she was evaluated by myself on 07/26/2024 with concerns of abdominal swelling and shortness of breath, also her blood pressure had been elevated, we increased her losartan  and her Coreg , repeat echo was arranged revealing severe MR, proBNP was elevated and we adjusted her diuretic.  She presents today for follow-up, was evaluated in the ED with episodes of chest pain, workup was overall  unrevealing.  Today she is feeling okay, we talked about her recent echocardiogram revealing severe mitral regurgitation and the necessity to see our structural heart team for further evaluation of this. She is euvolemic today, checking her weight daily and her BP -- she forgot both of her logs but reports they have been stable.  She is well compensated today.  She denies chest pain, palpitations, dyspnea, pnd, n, v, dizziness, syncope, edema, weight gain, or early satiety.   ROS: Review of Systems  Respiratory:  Positive for shortness of breath.   All other systems reviewed and are negative.    Studies Reviewed      Cardiac Studies & Procedures   ______________________________________________________________________________________________ CARDIAC CATHETERIZATION  CARDIAC CATHETERIZATION 06/07/2020  Conclusion  Ost LAD to Prox LAD lesion is 100% stenosed.  2nd Mrg lesion is 70% stenosed.  Prox RCA to Mid RCA lesion is 100% stenosed.  SVG graft was visualized by angiography.  Origin to Prox Graft lesion is 100% stenosed.  LIMA graft was visualized by angiography and is large.  The graft exhibits no disease.  LV end diastolic pressure is mildly elevated.  1. 3 vessel obstructive CAD - 100% proximal LAD - 70% mid to distal OM2 - 100% proximal RCA 2. Occluded SVG to RCA 3. Patent LIMA to LAD 4. Mildly elevated LVEDP  Plan: optimal medical therapy and risk factor modification. No targets for PCI. RCA does have some collaterals.  Findings Coronary Findings Diagnostic  Dominance: Right  Left Main Vessel was injected. Vessel is normal in caliber. Vessel is angiographically normal.  Left Anterior  Descending Ost LAD to Prox LAD lesion is 100% stenosed.  Left Circumflex Vessel was injected. Vessel is large. There is moderate diffuse disease throughout the vessel.  Second Obtuse Marginal Branch 2nd Mrg lesion is 70% stenosed.  Right Coronary Artery Prox RCA to Mid RCA  lesion is 100% stenosed.  Right Posterior Descending Artery Collaterals RPDA filled by collaterals from Dist LAD.  Right Posterior Atrioventricular Artery Collaterals RPAV filled by collaterals from Dist Cx.  Saphenous Graft To Dist RCA SVG graft was visualized by angiography. Origin to Prox Graft lesion is 100% stenosed.  LIMA LIMA Graft To Mid LAD LIMA graft was visualized by angiography and is large. The graft exhibits no disease.  Intervention  No interventions have been documented.   STRESS TESTS  MYOCARDIAL PERFUSION IMAGING 10/02/2023   ECHOCARDIOGRAM  ECHOCARDIOGRAM COMPLETE 08/16/2024  Narrative ECHOCARDIOGRAM REPORT    Patient Name:   [name] Date of Exam: 08/16/2024 Medical Rec #:  [id number]    Height:       67.0 in Accession #:    [id number]   Weight:       201.4 lb Date of Birth:  [birth year]   BSA:          2.028 m Patient Age:    47 years     BP:           143/76 mmHg Patient Gender: F            HR:           74 bpm. Exam Location:  [place]  Procedure: 2D Echo, Cardiac Doppler, Color Doppler and Strain Analysis (Both Spectral and Color Flow Doppler were utilized during procedure).  Indications:    DOE (dyspnea on exertion) [R06.09]  History:        Patient has prior history of Echocardiogram examinations, most recent 01/05/2024. Prior CABG, Mitral Valve Disease, Signs/Symptoms:Dyspnea; Risk Factors:Hypertension, Diabetes, Current Smoker and Dyslipidemia.  Sonographer:    [name] RDCS Referring Phys: [phone] [name] C Tanette Chauca  IMPRESSIONS   1. Left ventricular ejection fraction, by estimation, is 55 to 60%. The left ventricle has normal function. The left ventricle has no regional wall motion abnormalities. Left ventricular diastolic parameters are indeterminate. The average left ventricular global longitudinal strain is -10.8 %. The global longitudinal strain is abnormal. 2. Right ventricular systolic function is normal. The right  ventricular size is normal. 3. Left atrial size was moderately dilated. 4. The mitral valve was not well visualized. Severe mitral valve regurgitation. No evidence of mitral stenosis. 5. The aortic valve is normal in structure. Aortic valve regurgitation is not visualized. No aortic stenosis is present. 6. The inferior vena cava is normal in size with greater than 50% respiratory variability, suggesting right atrial pressure of 3 mmHg.  FINDINGS Left Ventricle: Left ventricular ejection fraction, by estimation, is 55 to 60%. The left ventricle has normal function. The left ventricle has no regional wall motion abnormalities. The average left ventricular global longitudinal strain is -10.8 %. Strain was performed and the global longitudinal strain is abnormal. The left ventricular internal cavity size was normal in size. There is no left ventricular hypertrophy. Left ventricular diastolic parameters are indeterminate.  Right Ventricle: The right ventricular size is normal. No increase in right ventricular wall thickness. Right ventricular systolic function is normal.  Left [facility]: Left atrial size was moderately dilated.  Right [facility]: Right atrial size was normal in size.  Pericardium: There is no evidence of pericardial effusion.  Mitral Valve:  The mitral valve was not well visualized. Severe mitral valve regurgitation. No evidence of mitral valve stenosis.  Tricuspid Valve: The tricuspid valve is normal in structure. Tricuspid valve regurgitation is not demonstrated. No evidence of tricuspid stenosis.  Aortic Valve: The aortic valve is normal in structure. Aortic valve regurgitation is not visualized. No aortic stenosis is present.  Pulmonic Valve: The pulmonic valve was normal in structure. Pulmonic valve regurgitation is not visualized. No evidence of pulmonic stenosis.  Aorta: The aortic root is normal in size and structure.  Venous: The inferior vena cava is normal in size with  greater than 50% respiratory variability, suggesting right atrial pressure of 3 mmHg.  IAS/Shunts: No atrial level shunt detected by color flow Doppler.   LEFT VENTRICLE PLAX 2D LVIDd:         5.70 cm   Diastology LVIDs:         4.20 cm   LV e' medial:    4.50 cm/s LV PW:         1.30 cm   LV E/e' medial:  24.2 LV IVS:        1.00 cm   LV e' lateral:   12.30 cm/s LVOT diam:     2.10 cm   LV E/e' lateral: 8.9 LV SV:         59 LV SV Index:   29        2D Longitudinal Strain LVOT Area:     3.46 cm  2D Strain GLS Avg:     -10.8 %   RIGHT VENTRICLE            IVC RV Basal diam:  3.90 cm    IVC diam: 2.00 cm RV Mid diam:    3.50 cm RV S prime:     6.20 cm/s TAPSE (M-mode): 1.5 cm  LEFT ATRIUM             Index        RIGHT ATRIUM           Index LA diam:        4.70 cm 2.32 cm/m   RA Area:     13.50 cm LA Vol (A2C):   87.9 ml 43.34 ml/m  RA Volume:   32.60 ml  16.07 ml/m LA Vol (A4C):   77.3 ml 38.11 ml/m LA Biplane Vol: 82.1 ml 40.48 ml/m AORTIC VALVE LVOT Vmax:   77.85 cm/s LVOT Vmean:  52.650 cm/s LVOT VTI:    0.171 m  AORTA Ao Root diam: 3.10 cm Ao Asc diam:  2.50 cm Ao Desc diam: 2.00 cm  MITRAL VALVE MV Area (PHT): 3.77 cm       SHUNTS MV Decel Time: 201 msec       Systemic VTI:  0.17 m MR Peak grad:    111.1 mmHg   Systemic Diam: 2.10 cm MR Mean grad:    78.0 mmHg MR Vmax:         527.00 cm/s MR Vmean:        425.0 cm/s MR PISA:         2.65 cm MR PISA Eff ROA: 19 mm MR PISA Radius:  0.65 cm MV E velocity: 109.00 cm/s MV A velocity: 59.70 cm/s MV E/A ratio:  1.83  Donna Szatkowski Crape MD Electronically signed by Donna Bittinger Crape MD Signature Date/Time: 08/17/2024/5:36:38 PM    Final    MONITORS  LONG TERM MONITOR (3-14 DAYS) 09/16/2022  Narrative Patch Wear Time:  14 days  and 0 hours (2023-09-08T16:01:38-0400 to 2023-09-22T16:01:42-0400)  Patient had a min HR of 55 bpm, max HR of 133 bpm, and avg HR of 87 bpm. Predominant underlying rhythm was  Sinus Rhythm. 1 run of Supraventricular Tachycardia occurred lasting 5 beats with a max rate of 133 bpm (avg 123 bpm). Isolated SVEs were rare (<1.0%), SVE Couplets were rare (<1.0%), and SVE Triplets were rare (<1.0%). Isolated VEs were rare (<1.0%), VE Couplets were rare (<1.0%), and no VE Triplets were present. Ventricular Bigeminy and Trigeminy were present.  Summary and conclusions: 1 run of supraventricular tachycardia 5 beats at rate of 133. Rare ventricular ectopy noted with bigeminy and trigeminy. Multiple trigger events showing sinus rhythm       ______________________________________________________________________________________________      Risk Assessment/Calculations    Physical Exam VS:  There were no vitals taken for this visit.       Wt Readings from Last 3 Encounters:  08/02/24 201 lb 6.4 oz (91.4 kg)  07/26/24 202 lb (91.6 kg)  06/15/24 203 lb 9.6 oz (92.4 kg)    GEN: Well nourished, well developed in no acute distress NECK: No JVD; No carotid bruits CARDIAC: RRR, 2/6 systolic murmur LSB, rubs, gallops RESPIRATORY: Trace rales appreciated ABDOMEN: Soft, non-tender, non-distended EXTREMITIES:  No edema; No deformity   ASSESSMENT AND PLAN HFmrEF -NYHA class II for fatigue, she appears to be euvolemic.  She is weighing herself daily.  Continue Coreg  25 mg twice daily, Jardiance --currently on DM dose, losartan  50 mg twice daily, Demadex  20 mg daily, she can take an additional dose as needed for weight gain of 3 pounds in 1 day or abdominal swelling.  CAD-s/p CABG x 2 2017 >> MPI 2024 no ischemia. Stable with no anginal symptoms. No indication for ischemic evaluation.    Severe mitral regurgitation-referred her to structural heart team when we resulted her echo last week, she is waiting to hear from their team.  Hypertension-blood pressure is controlled at 130/68, continue Coreg  25 mg twice daily, losartan  50 mg twice daily.  PAD-follows with vascular in  [place].  DM2 -follows with endocrinology at [facility]  Dispo: May take additional Demadex  as needed for weight gain of 3 pounds in 1 day or increase abdominal swelling.  Awaiting scheduling from the structural heart team, if she has not heard from them in the next week or two she will reach back out to our office.  Follow up with general cardiology in 6 months.  Signed, Delon JAYSON Hoover, NP

## 2024-08-23 ENCOUNTER — Encounter: Payer: Self-pay | Admitting: Cardiology

## 2024-08-23 ENCOUNTER — Ambulatory Visit: Attending: Cardiology | Admitting: Cardiology

## 2024-08-23 VITALS — BP 130/68 | HR 69 | Ht 67.0 in | Wt 199.0 lb

## 2024-08-23 DIAGNOSIS — I25708 Atherosclerosis of coronary artery bypass graft(s), unspecified, with other forms of angina pectoris: Secondary | ICD-10-CM

## 2024-08-23 DIAGNOSIS — I10 Essential (primary) hypertension: Secondary | ICD-10-CM | POA: Diagnosis not present

## 2024-08-23 DIAGNOSIS — I340 Nonrheumatic mitral (valve) insufficiency: Secondary | ICD-10-CM | POA: Diagnosis present

## 2024-08-23 DIAGNOSIS — Z951 Presence of aortocoronary bypass graft: Secondary | ICD-10-CM

## 2024-08-23 DIAGNOSIS — I5022 Chronic systolic (congestive) heart failure: Secondary | ICD-10-CM | POA: Diagnosis present

## 2024-08-23 NOTE — Patient Instructions (Signed)
 Medication Instructions:   You may take an additional 20 mg of demadex /torsemide  as needed for wt gain of 3 lbs in 1 day OR increased abdominal swelling.   *If you need a refill on your cardiac medications before your next appointment, please call your pharmacy*  Lab Work:  None today If you have labs (blood work) drawn today and your tests are completely normal, you will receive your results only by: MyChart Message (if you have MyChart) OR A paper copy in the mail If you have any lab test that is abnormal or we need to change your treatment, we will call you to review the results.  Testing/Procedures: None today   Follow-Up: At [facility], you and your health needs are our priority.  As part of our continuing mission to provide you with exceptional heart care, our providers are all part of one team.  This team includes your primary Cardiologist (physician) and Advanced Practice Providers or APPs (Physician Assistants and Nurse Practitioners) who all work together to provide you with the care you need, when you need it.  Your next appointment:   6 month(s)  Provider:   Lamar Fitch, MD   OR J[facility] at [facility]  We recommend signing up for the patient portal called MyChart.  Sign up information is provided on this After Visit Summary.  MyChart is used to connect with patients for Virtual Visits (Telemedicine).  Patients are able to view lab/test results, encounter notes, upcoming appointments, etc.  Non-urgent messages can be sent to your provider as well.   To learn more about what you can do with MyChart, go to [email or web address].   Other Instructions  If you have not heard from the Structural Heart team in 1 week, message us  on MyChart

## 2024-08-25 ENCOUNTER — Telehealth: Payer: Self-pay

## 2024-08-25 ENCOUNTER — Other Ambulatory Visit: Payer: Self-pay

## 2024-08-25 DIAGNOSIS — I340 Nonrheumatic mitral (valve) insufficiency: Secondary | ICD-10-CM

## 2024-08-25 DIAGNOSIS — I5022 Chronic systolic (congestive) heart failure: Secondary | ICD-10-CM

## 2024-08-25 NOTE — Telephone Encounter (Signed)
 Att to call pt to discuss note per Delon Hoover, NP. No Answer

## 2024-08-25 NOTE — Telephone Encounter (Signed)
 Spoke with the patient. Informed her that cardiac MRI needs to be done prior to Structural Heart consult.  She understood her 08/29/2024 consult will be cancelled and that she will be called to arrange cMRI. Once scheduled, she will have CBC drawn within 2 weeks of scan. She was grateful for call and agreed with plan.

## 2024-08-25 NOTE — Telephone Encounter (Signed)
-----   Message from [name] sent at 08/24/2024  3:33 PM EDT ----- Manuelita thank you, will do. ----- Message ----- From: Thukkani, Arun K, MD Sent: 08/24/2024   7:09 AM EDT To: Lamarr DELENA Redman, RN; Donna JAYSON Hoover, NP  Donna,      I reviewed the echo images again last night.  While the [place] looks to be severe, the duration of the jet is very very short.  This would seem to be more like moderate mitral regurgitation.  For this reason I would suggest getting a CMR to assess mitral regurgitation severity before referral.  Thanks, AT ----- Message ----- From: Thukkani, Arun K, MD Sent: 08/23/2024   1:25 PM EDT To: Donna JAYSON Hoover, NP  Nope, we can take it from there ----- Message ----- From: Tate Donna JAYSON, NP Sent: 08/23/2024  12:29 PM EDT To: Arun K Thukkani, MD  Dr. Wendel, I repeated an echo on her and she has sever MR. I referred her to [facility]. Just wondering if I could do anything else prior to sending her to you, or is the best thing just getting her to you asap? Best, Donna

## 2024-08-25 NOTE — Progress Notes (Deleted)
 Patient ID: [name] MRN: [id number] DOB/AGE: 50-15-1977 50 y.o.  Primary Care Physician:Lucas, Burnard Amble, NP Primary Cardiologist: Bernie  CC:  Mitral valvular disease management     FOCUSED PROBLEM LIST:   Mitral regurgitation Moderate LAE, severe MR, EF 55 to 60% TTE August 2025 CAD Status post LIMA to LAD and vein graft to PDA 2017 Patent LIMA to LAD, occluded vein graft to PDA cath 2021 Ischemic cardiomyopathy EF 45 to 50% TTE 2023 EF 55 to 60% TTE August 2025 T2DM Not on insulin  Hypertension Hyperlipidemia Aortic atherosclerosis CT abdomen pelvis 2017 Peripheral arterial disease CLI >> left iliofemoral endarterectomy and patch angioplasty May 2025 CKD stage II GFR 86 BMI 31/BSA 2.21 August 2024:  Patient consents to use of AI scribe. The patient is a 48 year old female with above listed medical problems referred for recommendations regarding severe mitral regurgitation seen on a recent echocardiogram.  The patient has a fairly complicated medical history but most recently was notable for development of critical limb ischemia requiring vascular surgery intervention.  She was seen by general cardiology recently.  She reported shortness of breath at this encounter.  In conjunction with her mitral regurgitation seen on echocardiogram she is referred for further recommendations.       Past Medical History:  Diagnosis Date   Abnormal stress test 08/20/2016   Formatting of this note might be different from the original. Added automatically from request for surgery [id number]   Acute appendicitis 01/28/2016   Acute appendicitis with localized peritonitis 01/28/2016   Acute non-ST segment elevation myocardial infarction (HCC) 03/17/2021   Anemia    Angina pectoris (HCC) 06/27/2020   Anxiety disorder 03/17/2021   Atherosclerosis of artery of extremity with rest pain (HCC) 05/05/2024   Calculus of gallbladder 06/05/2016   Cardiomyopathy (HCC)    postpartem  cardiomyopathy- 2012- has since been released by cardiology   Claudication in peripheral vascular disease (HCC) 10/29/2021   Peripheral arterial disease     Coronary artery disease 06/27/2020   COVID 10/14/2020   c/o cough and chest pressure    CVA (cerebral vascular accident) (HCC)    Depression    Dizziness 08/28/2022   Dyslipidemia 06/27/2020   Dyspnea on exertion 06/27/2020   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 LIMA to LAD, SVG to PDA in September 2017 at [facility]   Hyperglycemia due to type 2 diabetes mellitus (HCC) 02/25/2016   Hypertension    Liver abscess    2012- Leonce Birk drained placed for awhile.   Long-term insulin  use (HCC) 01/26/2017   Mixed hyperlipidemia 02/25/2016   NSTEMI (non-ST elevated myocardial infarction) (HCC) 06/07/2020   Obesity 09/18/2011   PCOS (polycystic ovarian syndrome) 02/25/2016   Persistent microalbuminuria associated with type 2 diabetes mellitus (HCC) 02/25/2016   Polyneuropathy 05/02/2024   Renal failure 09/25/2011   Due to vancomycin , IV contrast, sepsis.     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  Was hospitalized, intubated, had cardiopulmonary failure.    Tobacco abuse    Type 2 diabetes mellitus with hyperglycemia (HCC) 02/25/2016   Unstable angina (HCC) 06/27/2020  Past Surgical History:  Procedure Laterality Date   ABDOMINAL AORTOGRAM W/LOWER EXTREMITY N/A 12/29/2021   Procedure: ABDOMINAL AORTOGRAM W/LOWER EXTREMITY;  Surgeon: Court Dorn PARAS, MD;  Location: [facility];  Service: Cardiovascular;  Laterality: N/A;   ABDOMINAL AORTOGRAM  W/LOWER EXTREMITY N/A 12/30/2023   Procedure: ABDOMINAL AORTOGRAM W/LOWER EXTREMITY;  Surgeon: Court Dorn PARAS, MD;  Location: [facility];  Service: Cardiovascular;  Laterality: N/A;   APPENDECTOMY     CORONARY ARTERY BYPASS GRAFT  2017   [facility]   CRYOTHERAPY     90's- cervix   DILATION AND CURETTAGE OF UTERUS  1994   ENDARTERECTOMY FEMORAL Left 05/05/2024   Procedure: ENDARTERECTOMY, FEMORAL;  Surgeon: Lanis Fonda BRAVO, MD;  Location: [facility] OR;  Service: Vascular;  Laterality: Left;   LAPAROSCOPIC APPENDECTOMY N/A 01/28/2016   Procedure: APPENDECTOMY LAPAROSCOPIC;  Surgeon: Jina Nephew, MD;  Location: [facility] OR;  Service: General;  Laterality: N/A;   LEFT HEART CATH AND CORS/GRAFTS ANGIOGRAPHY N/A 06/07/2020   Procedure: LEFT HEART CATH AND CORS/GRAFTS ANGIOGRAPHY;  Surgeon: [place], Peter M, MD;  Location: [facility];  Service: Cardiovascular;  Laterality: N/A;   PATCH ANGIOPLASTY Left 05/05/2024   Procedure: ANGIOPLASTY, USING GEORGE BIOLOGIC PATCH;  Surgeon: Lanis Fonda BRAVO, MD;  Location: [facility] OR;  Service: Vascular;  Laterality: Left;   PERIPHERAL VASCULAR INTERVENTION  12/29/2021   Procedure: PERIPHERAL VASCULAR INTERVENTION;  Surgeon: Court Dorn PARAS, MD;  Location: [facility];  Service: Cardiovascular;;  Rt. Iliac   ROBOTIC ASSISTED SALPINGO OOPHERECTOMY Right 03/24/2016   Procedure: XI ROBOTIC ASSISTED LAPAROSCOPIC RIGHT SALPINGO OOPHORECTOMY;  Surgeon: Maurilio Ship, MD;  Location: [facility] ORS;  Service: Gynecology;  Laterality: Right;   TONSILLECTOMY      Family History  Problem Relation Age of Onset   CAD Mother    CAD Father    Diabetes Maternal Grandmother    Diabetes Paternal Grandfather     Social History   Socioeconomic History   Marital status: Single    Spouse name: Not on file   Number of children: Not on file   Years of education: Not on file   Highest education level: Not on file  Occupational History   Not on file   Tobacco Use   Smoking status: Every Day    Current packs/day: 0.50    Average packs/day: 0.5 packs/day for 0.6 years (0.3 ttl pk-yrs)    Types: Cigarettes    Start date: 01/22/2024   Smokeless tobacco: Never  Vaping Use   Vaping status: Never Used  Substance and Sexual Activity   Alcohol use: Yes    Comment: rare social   Drug use: No    Comment: x1 episode Meth use.   Sexual activity: Not Currently    Birth control/protection: None  Other Topics Concern   Not on file  Social History Narrative   Not on file   Social Drivers of Health   Financial Resource Strain: Not on file  Food Insecurity: Patient Declined (05/06/2024)   Hunger Vital Sign    Worried About Running Out of Food in the Last Year: Patient declined    Ran Out of Food in the Last Year: Patient declined  Transportation Needs: No Transportation Needs (05/06/2024)   PRAPARE - Administrator, Civil Service (Medical): No    Lack of Transportation (Non-Medical): No  Physical Activity: Not on file  Stress: Not on file  Social Connections: Not on file  Intimate  Partner Violence: Unknown (05/06/2024)   Humiliation, Afraid, Rape, and Kick questionnaire    Fear of Current or Ex-Partner: No    Emotionally Abused: No    Physically Abused: Not on file    Sexually Abused: No     Prior to Admission medications   Medication Sig Start Date End Date Taking? Authorizing Provider  albuterol  (VENTOLIN  HFA) 108 (90 Base) MCG/ACT inhaler Inhale 2 puffs into the lungs every 6 (six) hours as needed for wheezing or shortness of breath.    [provider]  aspirin  EC 81 MG tablet Take 1 tablet (81 mg total) by mouth daily. Swallow whole. 06/27/20   Krasowski, Robert J, MD  atorvastatin  (LIPITOR ) 80 MG tablet Take 1 tablet (80 mg total) by mouth daily. 02/09/24 08/23/24  Krasowski, Robert J, MD  Blood Pressure Monitoring (BLOOD PRESSURE CUFF) MISC 1 Units by Does not apply route daily. 07/26/24   Carlin Delon BROCKS, NP   carvedilol  (COREG ) 25 MG tablet Take 1 tablet (25 mg total) by mouth 2 (two) times daily with a meal. 07/26/24   Carlin Delon BROCKS, NP  clopidogrel  (PLAVIX ) 75 MG tablet Take 75 mg by mouth daily.    [provider]  Continuous Glucose Receiver (FREESTYLE LIBRE 2 READER) DEVI every 14 (fourteen) days. 02/14/24   [provider]  Continuous Glucose Sensor (FREESTYLE LIBRE 2 PLUS SENSOR) MISC every 14 (fourteen) days. 02/14/24   [provider]  empagliflozin  (JARDIANCE ) 25 MG TABS tablet Take 25 mg by mouth daily.    [provider]  gabapentin  (NEURONTIN ) 100 MG capsule Take 100 mg by mouth 3 (three) times daily. 01/28/24   [provider]  glipiZIDE  (GLUCOTROL  XL) 10 MG 24 hr tablet Take 10 mg by mouth daily. 02/18/24   [provider]  losartan  (COZAAR ) 50 MG tablet Take 1 tablet (50 mg total) by mouth in the morning and at bedtime. 07/26/24 10/24/24  Carlin Delon BROCKS, NP  metFORMIN  (GLUCOPHAGE -XR) 500 MG 24 hr tablet Take 2 tablets (1,000 mg total) by mouth 2 (two) times daily. 12/31/21   Court Dorn PARAS, MD  nicotine  (NICODERM CQ  - DOSED IN MG/24 HOURS) 21 mg/24hr patch Place 21 mg onto the skin daily.    [provider]  nitroGLYCERIN  (NITROSTAT ) 0.4 MG SL tablet DISSOLVE ONE TABLET UNDER THE TONGUE EVERY 5 MINUTES AS NEEDED 11/27/22   Krasowski, Robert J, MD  NOVOLIN 70/30 KWIKPEN (70-30) 100 UNIT/ML KwikPen Inject 30 Units into the skin in the morning and at bedtime. 01/06/24   [provider]  oxyCODONE -acetaminophen  (PERCOCET) 5-325 MG tablet Take 1 tablet by mouth every 4 (four) hours as needed for severe pain (pain score 7-10). 05/07/24 05/07/25  Magda Debby SAILOR, MD  Semaglutide (OZEMPIC, 0.25 OR 0.5 MG/DOSE, [place]) Inject 0.5 mg into the skin once a week.    [provider]  torsemide  (DEMADEX ) 20 MG tablet Take 20 mg by mouth daily.    [provider]    No Known Allergies  REVIEW OF SYSTEMS:  General: no  fevers/chills/night sweats Eyes: no blurry vision, diplopia, or amaurosis ENT: no sore throat or hearing loss Resp: no cough, wheezing, or hemoptysis CV: no edema or palpitations GI: no abdominal pain, nausea, vomiting, diarrhea, or constipation GU: no dysuria, frequency, or hematuria Skin: no rash Neuro: no headache, numbness, tingling, or weakness of extremities Musculoskeletal: no joint pain or swelling Heme: no bleeding, DVT, or easy bruising Endo: no polydipsia or polyuria  There were no  vitals taken for this visit.  PHYSICAL EXAM: GEN:  AO x 3 in no acute distress HEENT: normal Dentition: Normal*** Neck: JVP normal. +2***carotid upstrokes without bruits. No thyromegaly. Lungs: equal expansion, clear bilaterally CV: Apex is discrete and nondisplaced, RRR without murmur or gallop*** Abd: soft, non-tender, non-distended; no bruit; positive bowel sounds Ext: no edema, ecchymoses, or cyanosis Vascular: 2+ femoral pulses, 2+ radial pulses       Skin: warm and dry without rash Neuro: CN II-XII grossly intact; motor and sensory grossly intact    DATA AND STUDIES:  EKG: 2025 sinus rhythm with LVH  EKG Interpretation Date/Time:    Ventricular Rate:    PR Interval:    QRS Duration:    QT Interval:    QTC Calculation:   R Axis:      Text Interpretation:          CARDIAC STUDIES: Refer to CV Procedures and Imaging Tabs  07/26/2024: ALT 9; BUN 11; Creatinine, Ser 0.83; Hemoglobin 16.1; NT-Pro BNP 1,251; Platelets 162; Potassium 4.4; Sodium 138; TSH 1.190   STS RISK CALCULATOR: Pending  NHYA CLASS: ***     ASSESSMENT AND PLAN:   1. Nonrheumatic mitral valve regurgitation   2. S/P CABG x 2   3. Ischemic cardiomyopathy   4. Type 2 diabetes mellitus with hyperglycemia, with long-term current use of insulin  (HCC)   5. Hypertension associated with diabetes (HCC)   6. Hyperlipidemia associated with type 2 diabetes mellitus (HCC)   7. PVD (peripheral vascular  disease) (HCC)   8. CKD stage 2 due to type 2 diabetes mellitus (HCC)   9. BMI 31.0-31.9,adult     MR:*** Status post CABG: Low risk stress test in 2024.Continue aspirin  81 mg, Coreg  25 mg twice daily, atorvastatin  80 mg. Ischemic cardiomyopathy: Normalized LV function.  Continue Coreg  25 mg twice daily, Jardiance  25 mg daily, losartan  50 mg daily, torsemide  20 mg daily T2DM: Continue aspirin  81 mg, atorvastatin  80 mg, losartan  50 mg, Jardiance  25 mg Hypertension: Continue Coreg  25 mg twice daily, losartan  50 mg daily*** Hyperlipidemia: Continue atorvastatin  80 mg; goal LDL given peripheral vascular disease is less than 55. Peripheral vascular disease: Continue aspirin  81, Plavix  75 per vascular surgery. CKD stage II: Continue losartan  50 mg, Jardiance  25 mg Elevated BMI: Continue Ozempic 0.5 mg q. weekly  I have personally reviewed the patients imaging data as summarized above.  I have reviewed the natural history of mitral regurgitation with the patient and family members who are present today. We have discussed the limitations of medical therapy and the poor prognosis associated with symptomatic mitral regurgitation. We have also reviewed potential treatment options, including palliative medical therapy, conventional mitral surgery, and transcatheter mitral edge-to-edge repair. We discussed treatment options in the context of this patient's specific comorbid medical conditions.   All of the patient's questions were answered today. Will make further recommendations based on the results of studies outlined above.   I spent *** minutes reviewing all clinical data during and prior to this visit including all relevant imaging studies, laboratories, clinical information from other health systems and prior notes from both Cardiology and other specialties, interviewing the patient, conducting a complete physical examination, and coordinating care in order to formulate a comprehensive and personalized  evaluation and treatment plan.   Ian Cavey K Shina Wass, MD  08/25/2024 9:48 AM    [facility] [address] [place], [place], [place]  [address] Phone: [phone]; Fax: [phone]

## 2024-08-29 ENCOUNTER — Ambulatory Visit (HOSPITAL_COMMUNITY)

## 2024-08-29 ENCOUNTER — Encounter (HOSPITAL_BASED_OUTPATIENT_CLINIC_OR_DEPARTMENT_OTHER): Payer: Self-pay | Admitting: Nurse Practitioner

## 2024-08-29 ENCOUNTER — Ambulatory Visit (INDEPENDENT_AMBULATORY_CARE_PROVIDER_SITE_OTHER)
Admission: RE | Admit: 2024-08-29 | Discharge: 2024-08-29 | Disposition: A | Discharge status: Home / Self Care | Source: Ambulatory Visit | Attending: Nurse Practitioner | Admitting: Nurse Practitioner

## 2024-08-29 ENCOUNTER — Ambulatory Visit: Admitting: Internal Medicine

## 2024-08-29 DIAGNOSIS — G8929 Other chronic pain: Secondary | ICD-10-CM

## 2024-08-29 DIAGNOSIS — M542 Cervicalgia: Secondary | ICD-10-CM | POA: Diagnosis not present

## 2024-08-29 DIAGNOSIS — M25512 Pain in left shoulder: Secondary | ICD-10-CM

## 2024-08-29 DIAGNOSIS — M25511 Pain in right shoulder: Secondary | ICD-10-CM | POA: Diagnosis not present

## 2024-08-29 NOTE — Progress Notes (Unsigned)
 Subjective:     Patient ID: [name], female   DOB: Feb 17, 1976, 48 y.o.   MRN: [id number]  HPI 48 year old female with history of recent left iliofemoral endarterectomy for occluded left common femoral artery.  This was done for rest pain.  Postoperatively, she was doing well.  She had significant improvement in ABI and TBI.  Symptoms in the left lower extremity abated.  She had little to have any claudication.  She noted some medial thigh pain which was thought to be neurogenic, and she was referred back to our office from her primary care to ensure that she did not have a blood clot.  The DVT study was negative, however the study demonstrated thrombus on the endarterectomy patch.  She was asked to continue her medications by my partner Dr. Sheree, and sent for CT scan.  She presents today to discuss these findings.    On exam today, she had a rather flat affect.  She stated she recently saw her cardiologist and there was some concern for severe mitral regurg.  Between her last visit and the CT scan, she noted some worsening of claudication in the left lower extremity denies ischemic rest pain but does note some pain at night in the left lower extremity that improves with changing her sleeping position.  Overall, she states the left leg has less claudication then prior to her surgery, but more than it did roughly a month and a half ago.  The left medial thigh pain is also still present, but less than previous. She continues to smoke 1-1/2 packs/day.  She remains on aspirin  and Plavix .  Recent echo demonstrated severe mitral regurgitation.   Review of Systems Left medial leg pain    Objective:   Physical Exam There were no vitals filed for this visit.  Awake alert and oriented Left femoral pulse readily palpable No abnormalities of the left medial thigh Capillary refill of the left foot is delayed     LEFT     CompressibilityPhasicitySpontaneityPropertiesThrombus  Aging   +---------+---------------+---------+-----------+----------+--------------+   CFV     Full           Yes      Yes                                   +---------+---------------+---------+-----------+----------+--------------+   SFJ     Full                                                          +---------+---------------+---------+-----------+----------+--------------+   FV Prox  Full                                                          +---------+---------------+---------+-----------+----------+--------------+   FV Mid   Full                                                          +---------+---------------+---------+-----------+----------+--------------+  FV DistalFull                                                          +---------+---------------+---------+-----------+----------+--------------+   PFV     Full                                                          +---------+---------------+---------+-----------+----------+--------------+   POP     Full           Yes      Yes                                   +---------+---------------+---------+-----------+----------+--------------+   PTV     Full                                                          +---------+---------------+---------+-----------+----------+--------------+   PERO    Full                                                          +---------+---------------+---------+-----------+----------+--------------+         Summary:   LEFT:  - There is no evidence of deep vein thrombosis in the lower extremity.    - No cystic structure found in the popliteal fossa.    - Incidental finding: patient with left femoral endarterectomy 05/05/2024.  Common femoral artery at endarterectomy site noted to be partially  thrombosed without evidence of hemodynamically significant stenosis. Given  this finding, brief arterial  evaluation was  performed. Superficial femoral artery appears occluded with  reconstitution of the popliteal artery via collaterals. Dampened  monophasic three vessel runoff.     ABI Findings:  +---------+------------------+-----+----------+--------+  Right   Rt Pressure (mmHg)IndexWaveform  Comment   +---------+------------------+-----+----------+--------+  Brachial 118                                        +---------+------------------+-----+----------+--------+  ATA     94                0.72 monophasic          +---------+------------------+-----+----------+--------+  PTA     88                0.68 monophasic          +---------+------------------+-----+----------+--------+  Great Toe74                0.57                     +---------+------------------+-----+----------+--------+   +---------+------------------+-----+----------+-------+  Left    Lt Pressure (mmHg)IndexWaveform  Comment  +---------+------------------+-----+----------+-------+  Brachial 130                                       +---------+------------------+-----+----------+-------+  ATA     80                0.62 monophasic         +---------+------------------+-----+----------+-------+  PTA     70                0.54 monophasic         +---------+------------------+-----+----------+-------+  Great Toe20                0.15                    +---------+------------------+-----+----------+-------+   +-------+-----------+-----------+------------+------------+  ABI/TBIToday's ABIToday's TBIPrevious ABIPrevious TBI  +-------+-----------+-----------+------------+------------+  Right 0.72       0.57       0.67        0.47          +-------+-----------+-----------+------------+------------+  Left  0.62       0.15       0.88        0.59          +-------+-----------+-----------+------------+------------+         Right ABIs and TBIs appear  essentially unchanged compared to prior study  on 06/15/2024. Left ABIs and TBIs appear decreased compared to prior study  on 06/15/2024.    Summary:  Right: Resting right ankle-brachial index indicates moderate right lower  extremity arterial disease. The right toe-brachial index is abnormal.   Left: Resting left ankle-brachial index indicates moderate left lower  extremity arterial disease. The left toe-brachial index is abnormal.      Assessment/plan     48 year old female here with left medial thigh pain after left iliofemoral endarterectomy.  She was seen by my partner several weeks ago with thrombus appreciated on the left common femoral artery patch.  Interestingly, between her visit, and follow-up CT scan, she noted worsening in left lower extremity claudication symptoms.  I question whether she embolized this thrombus into her superficial femoral artery which is now occluded on CT scan.    On physical exam, [name] continues to have a strong, palpable, left femoral pulse.  Nonpalpable pulse in the foot.  No ischemic wounds.    I had a long discussion with [name] regarding the above, most notably that I am unsure as to why she has had interval occlusion of her superficial femoral artery.  I do not know if she has had some asymptomatic atrial fibrillation which led to an embolic event, ORIF smoking 1-1/2 packs/day led to thrombus accumulation on the patch, and subsequent embolization into the superficial femoral artery.   Regardless, at the age of 58, we discussed that this is a serious problem.  I am comforted knowing that her claudication symptoms are still much better than prior to surgery, but I am frustrated by the sequela.  We discussed that she is at very high risk for future limb loss, and the best course of treatment, is continued medical management until the claudication becomes lifestyle-limiting or she has ischemic rest pain.  I do not think that any intervention will have  significant durability, and at the age of 69, if we continue to intervene, this will result in subsequent limb loss down the road.  My  plan is to see her in 3 months time after her mitral regurgitation has been addressed.  I asked her to continue her current medication regimen. She is working on smoking cessation. I asked her to call my office should any questions or concerns arise, worsening claudication, new ischemic rest pain, new ulceration occur.  [name]

## 2024-08-30 ENCOUNTER — Telehealth: Payer: Self-pay

## 2024-08-30 NOTE — Telephone Encounter (Signed)
 Recommendations reviewed with pt as per [facility] note.  Pt verbalized understanding and had no additional questions.  MRI has been ordered by LOIS Redman, RN

## 2024-08-30 NOTE — Telephone Encounter (Signed)
-----   Message from Delon JAYSON Hoover sent at 08/24/2024  3:33 PM EDT ----- Can we reach out to her and let her know that the structural heart MD re-read the echo and felt it was more moderate as opposed to severe, so he does not need to necessarily see her just yet. He did suggest getting a cardiac MRI to assess the severity better than the echo.   Can we arrange for a cardiac MRI for her please to assess for mitral regurgitation. ----- Message ----- From: Thukkani, Arun K, MD Sent: 08/24/2024   7:09 AM EDT To: Lamarr DELENA Redman, RN; Delon JAYSON Hoover, NP  Delon,      I reviewed the echo images again last night.  While the [place] looks to be severe, the duration of the jet is very very short.  This would seem to be more like moderate mitral regurgitation.  For this reason I would suggest getting a CMR to assess mitral regurgitation severity before referral.  Thanks, AT ----- Message ----- From: Thukkani, Arun K, MD Sent: 08/23/2024   1:25 PM EDT To: Delon JAYSON Hoover, NP  Nope, we can take it from there ----- Message ----- From: Hoover Delon JAYSON, NP Sent: 08/23/2024  12:29 PM EDT To: Arun K Thukkani, MD  Dr. Wendel, I repeated an echo on her and she has sever MR. I referred her to [facility]. Just wondering if I could do anything else prior to sending her to you, or is the best thing just getting her to you asap? Best, Delon

## 2024-08-31 ENCOUNTER — Ambulatory Visit: Attending: Vascular Surgery | Admitting: Vascular Surgery

## 2024-08-31 ENCOUNTER — Encounter: Payer: Self-pay | Admitting: Vascular Surgery

## 2024-08-31 VITALS — BP 185/103 | HR 83 | Temp 98.2°F | Ht 67.0 in | Wt 201.0 lb

## 2024-08-31 DIAGNOSIS — I739 Peripheral vascular disease, unspecified: Secondary | ICD-10-CM | POA: Insufficient documentation

## 2024-09-01 ENCOUNTER — Other Ambulatory Visit: Payer: Self-pay

## 2024-09-01 DIAGNOSIS — I739 Peripheral vascular disease, unspecified: Secondary | ICD-10-CM

## 2024-09-21 ENCOUNTER — Telehealth: Payer: Self-pay

## 2024-09-21 NOTE — Telephone Encounter (Signed)
   Pre-operative Risk Assessment    Patient Name: [name]  DOB: May 27, 1976 MRN: [id number]   Date of last office visit: 08/23/2024 Date of next office visit: N/A  Request for Surgical Clearance    Procedure:  Dental Extraction - Amount of Teeth to be Pulled:  All teeth  Date of Surgery:  Clearance TBD                                Surgeon:  Dr. Merrianne Surgeon's Group or Practice Name:  [facility] Phone number:  [phone] Fax number:  [phone]   Type of Clearance Requested:   - Pharmacy:  Hold Clopidogrel  (Plavix ) please advise   Type of Anesthesia:  Not Indicated   Additional requests/questions:  Does this patient need antibiotics?  Bonney Calvert Pouch   09/21/2024, 9:13 AM

## 2024-09-21 NOTE — Telephone Encounter (Signed)
   Patient Name: [name]  DOB: 02/03/1976 MRN: [id number]  Primary Cardiologist: Lamar Fitch, MD  Chart reviewed as part of pre-operative protocol coverage.   Pt currently on plavix  for hx of stroke, CAD, and PAD. She also has a history of severe MR pending evaluation by structural heart team, has not yet been repairs or replaced.  Patient has known 100% occlusion of RCA and SVG-PDA with collateral flow. Recommend holding plavix  while continuing ASA for tooth extractions. Resume plavix  after per surgeon.   SBE prophylaxis is not required for the patient from a cardiac standpoint.  I will route this recommendation to the requesting party via Epic fax function and remove from pre-op pool.  Please call with questions.  Jon Garre Evoleth Nordmeyer, PA 09/21/2024, 9:48 AM

## 2024-09-25 ENCOUNTER — Encounter (HOSPITAL_COMMUNITY): Payer: Self-pay

## 2024-09-25 ENCOUNTER — Other Ambulatory Visit (HOSPITAL_COMMUNITY)
Admission: RE | Admit: 2024-09-25 | Discharge: 2024-09-25 | Disposition: A | Discharge status: Home / Self Care | Payer: Self-pay | Source: Ambulatory Visit | Attending: Medical Genetics | Admitting: Medical Genetics

## 2024-09-26 ENCOUNTER — Encounter: Payer: Self-pay | Admitting: Neurology

## 2024-09-26 ENCOUNTER — Ambulatory Visit: Payer: Self-pay | Admitting: Cardiology

## 2024-09-26 LAB — HEMOGLOBIN AND HEMATOCRIT, BLOOD
Hematocrit: 52.3 % — ABNORMAL HIGH (ref 34.0–46.6)
Hemoglobin: 16.8 g/dL — ABNORMAL HIGH (ref 11.1–15.9)

## 2024-09-27 ENCOUNTER — Ambulatory Visit (HOSPITAL_COMMUNITY)
Admission: RE | Admit: 2024-09-27 | Discharge: 2024-09-27 | Disposition: A | Discharge status: Home / Self Care | Source: Ambulatory Visit | Attending: Cardiology | Admitting: Cardiology

## 2024-09-27 ENCOUNTER — Other Ambulatory Visit: Payer: Self-pay | Admitting: Cardiology

## 2024-09-27 ENCOUNTER — Encounter (HOSPITAL_COMMUNITY): Payer: Self-pay | Admitting: *Deleted

## 2024-09-27 DIAGNOSIS — I5022 Chronic systolic (congestive) heart failure: Secondary | ICD-10-CM | POA: Insufficient documentation

## 2024-09-27 DIAGNOSIS — I340 Nonrheumatic mitral (valve) insufficiency: Secondary | ICD-10-CM | POA: Insufficient documentation

## 2024-09-27 MED ORDER — GADOBUTROL 1 MMOL/ML IV SOLN
10.0000 mL | Freq: Once | INTRAVENOUS | Status: AC | PRN
  Administered 2024-09-27: 10 mL via INTRAVENOUS

## 2024-09-29 NOTE — Progress Notes (Signed)
 Attempted to call the patient. Patient did not answer the phone. Received a message that the call could not be completed at this time.

## 2024-10-02 LAB — GENECONNECT MOLECULAR SCREEN: Genetic Analysis Overall Interpretation: NEGATIVE

## 2024-10-03 ENCOUNTER — Ambulatory Visit: Attending: Cardiology | Admitting: Cardiology

## 2024-10-03 ENCOUNTER — Encounter: Payer: Self-pay | Admitting: Cardiology

## 2024-10-03 VITALS — BP 130/76 | HR 82 | Ht 67.0 in | Wt 202.6 lb

## 2024-10-03 DIAGNOSIS — I340 Nonrheumatic mitral (valve) insufficiency: Secondary | ICD-10-CM | POA: Insufficient documentation

## 2024-10-03 DIAGNOSIS — Z951 Presence of aortocoronary bypass graft: Secondary | ICD-10-CM | POA: Insufficient documentation

## 2024-10-03 DIAGNOSIS — R0609 Other forms of dyspnea: Secondary | ICD-10-CM | POA: Insufficient documentation

## 2024-10-03 DIAGNOSIS — I255 Ischemic cardiomyopathy: Secondary | ICD-10-CM | POA: Diagnosis not present

## 2024-10-03 DIAGNOSIS — I25708 Atherosclerosis of coronary artery bypass graft(s), unspecified, with other forms of angina pectoris: Secondary | ICD-10-CM | POA: Insufficient documentation

## 2024-10-03 MED ORDER — SACUBITRIL-VALSARTAN 24-26 MG PO TABS: 1.0000 | ORAL_TABLET | Freq: Two times a day (BID) | ORAL | 3 refills | Status: DC

## 2024-10-03 NOTE — Progress Notes (Signed)
 Cardiology Office Note:    Date:  10/03/2024   ID:  [name], DOB Sep 23, 1976, MRN [id number]  PCP:  Donna Burnard Amble, NP  Cardiologist:  Donna Fitch, MD    Referring MD: Donna Burnard Amble, NP   No chief complaint on file. Doing fine   History of Present Illness:    [name] is a 48 y.o. female  complex past medical history in 2017 she did have coronary artery bypass graft with LIMA to LAD SVG to RCA history. Significant for essential hypertension type 2 diabetes smoking to still ongoing, in 2021 she had cardiac catheterization done which showed completely occluded LAD however LIMA to LAD was functional and patent, there was complete occlusion of the proximal and mid RCA SVG to RCA was completely occluded however RCA distal portion had pretty decent collateralization. She also got 70% mid to distal obtuse marginal branch. She does have severe peripheral vascular disease.  Few months ago she was admitted to the hospital she did have left femoral artery anterior arthrectomy done for severe claudications.  Also since I seen her last time she had echocardiogram done which shows severe mitral regurgitation, after that heart MRI has been done which showed moderate mitral regurgitation surprisingly ejection fraction was diminished.  It was not the manage on echocardiogram from summer.  She comes today to talk about it.  Overall she is doing fair.  She still can walk some the problem is a lot like issue but otherwise no paroxysmal nocturnal dyspnea she does get some shortness of breath but since time of stroke her ability to exercise is somewhat limited.  No chest pain tightness squeezing pressure burning chest  Past Medical History:  Diagnosis Date   Abnormal stress test 08/20/2016   Formatting of this note might be different from the original. Added automatically from request for surgery [id number]   Acute appendicitis 01/28/2016   Acute appendicitis with localized peritonitis 01/28/2016    Acute non-ST segment elevation myocardial infarction (HCC) 03/17/2021   Anemia    Angina pectoris 06/27/2020   Anxiety disorder 03/17/2021   Atherosclerosis of artery of extremity with rest pain (HCC) 05/05/2024   Calculus of gallbladder 06/05/2016   Cardiomyopathy (HCC)    postpartem cardiomyopathy- 2012- has since been released by cardiology   Claudication in peripheral vascular disease 10/29/2021   Peripheral arterial disease     Coronary artery disease 06/27/2020   COVID 10/14/2020   c/o cough and chest pressure    CVA (cerebral vascular accident) (HCC)    Depression    Dizziness 08/28/2022   Dyslipidemia 06/27/2020   Dyspnea on exertion 06/27/2020   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 LIMA to LAD, SVG to PDA in September 2017 [facility]   Hyperglycemia due to type 2 diabetes mellitus (HCC) 02/25/2016   Hypertension    Liver abscess    2012- Leonce Birk drained placed for awhile.   Long-term insulin  use (HCC) 01/26/2017   Mixed hyperlipidemia 02/25/2016   NSTEMI (non-ST elevated myocardial infarction) (HCC) 06/07/2020   Obesity 09/18/2011   PCOS (polycystic ovarian syndrome) 02/25/2016   Persistent microalbuminuria associated with type 2 diabetes mellitus (HCC) 02/25/2016   Polyneuropathy 05/02/2024   Renal failure 09/25/2011   Due to vancomycin , IV contrast, sepsis.     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  Was hospitalized, intubated, had cardiopulmonary failure.    Tobacco abuse    Type 2 diabetes mellitus with hyperglycemia (HCC) 02/25/2016   Unstable angina (HCC) 06/27/2020    Past  Surgical History:  Procedure Laterality Date   ABDOMINAL AORTOGRAM W/LOWER EXTREMITY N/A 12/29/2021   Procedure: ABDOMINAL AORTOGRAM W/LOWER EXTREMITY;  Surgeon: Court Dorn PARAS, MD;  Location: [facility];  Service: Cardiovascular;  Laterality: N/A;   ABDOMINAL AORTOGRAM W/LOWER EXTREMITY N/A 12/30/2023   Procedure: ABDOMINAL AORTOGRAM W/LOWER EXTREMITY;  Surgeon: Court Dorn PARAS, MD;  Location: [facility];  Service: Cardiovascular;  Laterality: N/A;   APPENDECTOMY     CORONARY ARTERY BYPASS GRAFT  2017   [facility]   CRYOTHERAPY     90's- cervix   DILATION AND CURETTAGE OF UTERUS  1994   ENDARTERECTOMY FEMORAL Left 05/05/2024   Procedure: ENDARTERECTOMY, FEMORAL;  Surgeon: Lanis Fonda BRAVO, MD;  Location: [facility] OR;  Service: Vascular;  Laterality: Left;   LAPAROSCOPIC APPENDECTOMY N/A 01/28/2016   Procedure: APPENDECTOMY LAPAROSCOPIC;  Surgeon: Jina Nephew, MD;  Location: [facility] OR;  Service: General;  Laterality: N/A;   LEFT HEART CATH AND CORS/GRAFTS ANGIOGRAPHY N/A 06/07/2020   Procedure: LEFT HEART CATH AND CORS/GRAFTS ANGIOGRAPHY;  Surgeon: [place], Peter M, MD;  Location: [facility];  Service: Cardiovascular;  Laterality: N/A;   PATCH ANGIOPLASTY Left 05/05/2024   Procedure: ANGIOPLASTY, USING GEORGE BIOLOGIC PATCH;  Surgeon: Lanis Fonda BRAVO, MD;  Location: [facility] OR;  Service: Vascular;  Laterality: Left;   PERIPHERAL VASCULAR INTERVENTION  12/29/2021   Procedure: PERIPHERAL VASCULAR INTERVENTION;  Surgeon: Court Dorn PARAS, MD;  Location: [facility];  Service: Cardiovascular;;  Rt. Iliac   ROBOTIC ASSISTED SALPINGO OOPHERECTOMY Right 03/24/2016   Procedure: XI ROBOTIC ASSISTED LAPAROSCOPIC RIGHT SALPINGO OOPHORECTOMY;  Surgeon: Maurilio Ship, MD;  Location: [facility] ORS;  Service: Gynecology;  Laterality: Right;   TONSILLECTOMY      Current Medications: Current Meds  Medication Sig   albuterol  (VENTOLIN  HFA) 108 (90 Base) MCG/ACT inhaler  Inhale 2 puffs into the lungs every 6 (six) hours as needed for wheezing or shortness of breath.   aspirin  EC 81 MG tablet Take 1 tablet (81 mg total) by mouth daily. Swallow whole.   atorvastatin  (LIPITOR ) 80 MG tablet Take 1 tablet (80 mg total) by mouth daily.   carvedilol  (COREG ) 25 MG tablet Take 1 tablet (25 mg total) by mouth 2 (two) times daily with a meal.   clopidogrel  (PLAVIX ) 75 MG tablet Take 75 mg by mouth daily.   empagliflozin  (JARDIANCE ) 25 MG TABS tablet Take 25 mg by mouth daily.   gabapentin  (NEURONTIN ) 300 MG capsule Take 300 mg by mouth 3 (three) times daily.   glipiZIDE  (GLUCOTROL  XL) 10 MG 24 hr tablet Take 10 mg by mouth daily.   insulin  lispro (HUMALOG) 100 UNIT/ML KwikPen Inject 0-100 Units into the skin as directed. Via pump   losartan  (COZAAR ) 50 MG tablet Take 1 tablet (50 mg total) by mouth in the morning and at bedtime.   metFORMIN  (GLUCOPHAGE -XR) 500 MG 24 hr tablet Take 2 tablets (1,000 mg total) by mouth 2 (two) times daily.   nicotine  (NICODERM CQ  - DOSED IN MG/24 HOURS) 21 mg/24hr patch Place 21 mg onto the skin  daily.   nitroGLYCERIN  (NITROSTAT ) 0.4 MG SL tablet DISSOLVE ONE TABLET UNDER THE TONGUE EVERY 5 MINUTES AS NEEDED   NOVOLIN 70/30 KWIKPEN (70-30) 100 UNIT/ML KwikPen Inject 0-100 Units into the skin as directed. Via pump   OZEMPIC, 0.25 OR 0.5 MG/DOSE, 2 MG/3ML SOPN Inject 2.5 mg into the skin once a week.   torsemide  (DEMADEX ) 20 MG tablet Take 20 mg by mouth daily.   [DISCONTINUED] gabapentin  (NEURONTIN ) 100 MG capsule Take 100 mg by mouth 3 (three) times daily.     Allergies:   Patient has no known allergies.   Social History   Socioeconomic History   Marital status: Single    Spouse name: Not on file   Number of children: Not on file   Years of education: Not on file   Highest education level: Not on file  Occupational History   Not on file  Tobacco Use   Smoking status: Every Day    Current packs/day: 0.50    Average packs/day: 0.5  packs/day for 0.7 years (0.3 ttl pk-yrs)    Types: Cigarettes    Start date: 01/22/2024   Smokeless tobacco: Never   Tobacco comments:    Taking Chantix to help with smoking cessation 08/31/24  Vaping Use   Vaping status: Never Used  Substance and Sexual Activity   Alcohol use: Yes    Comment: rare social   Drug use: No    Comment: x1 episode Meth use.   Sexual activity: Not Currently    Birth control/protection: None  Other Topics Concern   Not on file  Social History Narrative   Not on file   Social Drivers of Health   Financial Resource Strain: Not on file  Food Insecurity: Patient Declined (05/06/2024)   Hunger Vital Sign    Worried About Running Out of Food in the Last Year: Patient declined    Ran Out of Food in the Last Year: Patient declined  Transportation Needs: No Transportation Needs (05/06/2024)   PRAPARE - Administrator, Civil Service (Medical): No    Lack of Transportation (Non-Medical): No  Physical Activity: Not on file  Stress: Not on file  Social Connections: Not on file     Family History: The patient's family history includes CAD in her father and mother; Diabetes in her maternal grandmother and paternal grandfather. ROS:   Please see the history of present illness.    All 14 point review of systems negative except as described per history of present illness  EKGs/Labs/Other Studies Reviewed:         Recent Labs: 07/26/2024: ALT 9; BUN 11; Creatinine, Ser 0.83; NT-Pro BNP 1,251; Platelets 162; Potassium 4.4; Sodium 138; TSH 1.190 09/25/2024: Hemoglobin 16.8  Recent Lipid Panel    Component Value Date/Time   CHOL 121 05/06/2024 0012   CHOL 99 (L) 12/02/2021 0926   TRIG 127 05/06/2024 0012   HDL 24 (L) 05/06/2024 0012   HDL 30 (L) 12/02/2021 0926   CHOLHDL 5.0 05/06/2024 0012   VLDL 25 05/06/2024 0012   LDLCALC 72 05/06/2024 0012   LDLCALC 45 12/02/2021 0926    Physical Exam:    VS:  BP 130/76   Pulse 82   Ht 5' 7 (1.702 m)    Wt 202 lb 9.6 oz (91.9 kg)   SpO2 96%   BMI 31.73 kg/m     Wt Readings from Last 3 Encounters:  10/03/24 202 lb 9.6 oz (91.9 kg)  08/31/24 201 lb (91.2  kg)  08/23/24 199 lb (90.3 kg)     GEN:  Well nourished, well developed in no acute distress HEENT: Normal NECK: No JVD; No carotid bruits LYMPHATICS: No lymphadenopathy CARDIAC: RRR, holosystolic murmur grade 2/6 to 3/6 best heard left border sternum, no rubs, no gallops RESPIRATORY:  Clear to auscultation without rales, wheezing or rhonchi  ABDOMEN: Soft, non-tender, non-distended MUSCULOSKELETAL:  No edema; No deformity  SKIN: Warm and dry LOWER EXTREMITIES: no swelling NEUROLOGIC:  Alert and oriented x 3 PSYCHIATRIC:  Normal affect   ASSESSMENT:    1. Ischemic cardiomyopathy   2. Coronary artery disease of bypass graft of native heart with stable angina pectoris   3. History of coronary artery bypass graft   4. Nonrheumatic mitral valve regurgitation    PLAN:    In order of problems listed above:  Ischemic cardiomyopathy ejection fraction 35%.  Will schedule to have another echocardiogram.  The reason why I want to do it is the fact that she did not have echocardiogram before showing normal ejection fraction now only 35% based on her MRI.  Looks like also mitral regurgitation is only moderate rather than severe so no intervention needed from that point of view.  I will ask her to stop losartan  and start taking Entresto 24-26 twice daily Chem-7 will be done next week.  See her back in my office in about 2 months but again echocardiogram will be repeated. Coronary to disease completely occluded graft to RCA RCA is also occluded but MRI did not show any viability.  Will continue medical therapy. Dyslipidemia I did review KPN which show me her LDL 72 HDL 24.  Will continue present management for now. Diabetes last hemoglobin A1c is from May at 10.3 obviously not well-controlled we had a brief discussion about this I strongly  recommended to have it and what she can to get this better controlled   Medication Adjustments/Labs and Tests Ordered: Current medicines are reviewed at length with the patient today.  Concerns regarding medicines are outlined above.  No orders of the defined types were placed in this encounter.  Medication changes: No orders of the defined types were placed in this encounter.   Signed, Donna DOROTHA Fitch, MD, [facility] 10/03/2024 4:54 PM    [facility]

## 2024-10-03 NOTE — Patient Instructions (Addendum)
 Medication Instructions:   STOP: Losartan   START: Entresto 24-26 1 tablet twice daily   Lab Work: Your physician recommends that you return for lab work in: 1 week You need to have labs done when you are fasting.  You can come Monday through Friday 8:30 am to 12:00 pm and 1:15 to 4:30. You do not need to make an appointment as the order has already been placed. The labs you are going to have done are BMET.    Testing/Procedures: Your physician has requested that you have an echocardiogram. Echocardiography is a painless test that uses sound waves to create images of your heart. It provides your doctor with information about the size and shape of your heart and how well your heart's chambers and valves are working. This procedure takes approximately one hour. There are no restrictions for this procedure. Please do NOT wear cologne, perfume, aftershave, or lotions (deodorant is allowed). Please arrive 15 minutes prior to your appointment time.  Please note: We ask at that you not bring children with you during ultrasound (echo/ vascular) testing. Due to room size and safety concerns, children are not allowed in the ultrasound rooms during exams. Our front office staff cannot provide observation of children in our lobby area while testing is being conducted. An adult accompanying a patient to their appointment will only be allowed in the ultrasound room at the discretion of the ultrasound technician under special circumstances. We apologize for any inconvenience.    Follow-Up: At [facility], you and your health needs are our priority.  As part of our continuing mission to provide you with exceptional heart care, we have created designated Provider Care Teams.  These Care Teams include your primary Cardiologist (physician) and Advanced Practice Providers (APPs -  Physician Assistants and Nurse Practitioners) who all work together to provide you with the care you need, when you need it.  We  recommend signing up for the patient portal called MyChart.  Sign up information is provided on this After Visit Summary.  MyChart is used to connect with patients for Virtual Visits (Telemedicine).  Patients are able to view lab/test results, encounter notes, upcoming appointments, etc.  Non-urgent messages can be sent to your provider as well.   To learn more about what you can do with MyChart, go to [email or web address].    Your next appointment:   3 month(s)  The format for your next appointment:   In Person  Provider:   Lamar Fitch, MD    Other Instructions NA

## 2024-10-19 ENCOUNTER — Other Ambulatory Visit (HOSPITAL_BASED_OUTPATIENT_CLINIC_OR_DEPARTMENT_OTHER): Payer: Self-pay

## 2024-10-19 ENCOUNTER — Telehealth: Payer: Self-pay | Admitting: Pharmacy Technician

## 2024-10-19 ENCOUNTER — Other Ambulatory Visit: Payer: Self-pay

## 2024-10-19 ENCOUNTER — Ambulatory Visit: Attending: Cardiology

## 2024-10-19 ENCOUNTER — Other Ambulatory Visit (HOSPITAL_COMMUNITY): Payer: Self-pay

## 2024-10-19 DIAGNOSIS — R0609 Other forms of dyspnea: Secondary | ICD-10-CM | POA: Insufficient documentation

## 2024-10-19 LAB — ECHOCARDIOGRAM COMPLETE
AR max vel: 1.82 cm2
AV Area VTI: 1.79 cm2
AV Area mean vel: 1.7 cm2
AV Mean grad: 3 mmHg
AV Peak grad: 5.3 mmHg
Ao pk vel: 1.15 m/s
Area-P 1/2: 5.75 cm2
Calc EF: 30.7 %
MV M vel: 5.95 m/s
MV Peak grad: 141.6 mmHg
Radius: 0.83 cm
S' Lateral: 4.5 cm
Single Plane A2C EF: 28.1 %
Single Plane A4C EF: 36.1 %

## 2024-10-19 MED ORDER — ENTRESTO 24-26 MG PO TABS
1.0000 | ORAL_TABLET | Freq: Two times a day (BID) | ORAL | 3 refills | Status: DC
  Filled 2024-10-19: qty 180, 90d supply, fill #0

## 2024-10-19 NOTE — Telephone Encounter (Signed)
   Insurance wants brand per pa. I tried to do test claims for brand and generic but it would not allow me. Even though The generic was rejected in wam. Sent message to get daw y1 brand entresto sent in  Cmm [phone]

## 2024-10-24 ENCOUNTER — Ambulatory Visit: Payer: Self-pay | Admitting: Cardiology

## 2024-10-27 DIAGNOSIS — I255 Ischemic cardiomyopathy: Secondary | ICD-10-CM | POA: Diagnosis not present

## 2024-10-27 DIAGNOSIS — I2510 Atherosclerotic heart disease of native coronary artery without angina pectoris: Secondary | ICD-10-CM | POA: Diagnosis not present

## 2024-10-27 DIAGNOSIS — I739 Peripheral vascular disease, unspecified: Secondary | ICD-10-CM

## 2024-10-27 DIAGNOSIS — I509 Heart failure, unspecified: Secondary | ICD-10-CM | POA: Diagnosis not present

## 2024-10-27 DIAGNOSIS — I119 Hypertensive heart disease without heart failure: Secondary | ICD-10-CM | POA: Diagnosis not present

## 2024-10-28 DIAGNOSIS — R079 Chest pain, unspecified: Secondary | ICD-10-CM | POA: Diagnosis not present

## 2024-10-28 DIAGNOSIS — I509 Heart failure, unspecified: Secondary | ICD-10-CM | POA: Diagnosis not present

## 2024-10-28 DIAGNOSIS — I2510 Atherosclerotic heart disease of native coronary artery without angina pectoris: Secondary | ICD-10-CM | POA: Diagnosis not present

## 2024-10-28 DIAGNOSIS — I255 Ischemic cardiomyopathy: Secondary | ICD-10-CM

## 2024-10-28 DIAGNOSIS — I340 Nonrheumatic mitral (valve) insufficiency: Secondary | ICD-10-CM | POA: Diagnosis not present

## 2024-10-29 DIAGNOSIS — R079 Chest pain, unspecified: Secondary | ICD-10-CM | POA: Diagnosis not present

## 2024-10-29 DIAGNOSIS — I2510 Atherosclerotic heart disease of native coronary artery without angina pectoris: Secondary | ICD-10-CM | POA: Diagnosis not present

## 2024-10-29 DIAGNOSIS — I340 Nonrheumatic mitral (valve) insufficiency: Secondary | ICD-10-CM | POA: Diagnosis not present

## 2024-10-29 DIAGNOSIS — I509 Heart failure, unspecified: Secondary | ICD-10-CM | POA: Diagnosis not present

## 2024-11-01 ENCOUNTER — Encounter: Payer: Self-pay | Admitting: Cardiology

## 2024-11-02 ENCOUNTER — Ambulatory Visit (HOSPITAL_BASED_OUTPATIENT_CLINIC_OR_DEPARTMENT_OTHER): Admission: EM | Admit: 2024-11-02 | Discharge: 2024-11-02 | Disposition: A | Discharge status: Home / Self Care

## 2024-11-02 ENCOUNTER — Other Ambulatory Visit (HOSPITAL_BASED_OUTPATIENT_CLINIC_OR_DEPARTMENT_OTHER): Payer: Self-pay

## 2024-11-02 ENCOUNTER — Encounter (HOSPITAL_BASED_OUTPATIENT_CLINIC_OR_DEPARTMENT_OTHER): Payer: Self-pay

## 2024-11-02 ENCOUNTER — Ambulatory Visit (INDEPENDENT_AMBULATORY_CARE_PROVIDER_SITE_OTHER): Admit: 2024-11-02 | Discharge: 2024-11-02 | Disposition: A | Discharge status: Home / Self Care | Admitting: Radiology

## 2024-11-02 DIAGNOSIS — S92355A Nondisplaced fracture of fifth metatarsal bone, left foot, initial encounter for closed fracture: Secondary | ICD-10-CM

## 2024-11-02 DIAGNOSIS — M25475 Effusion, left foot: Secondary | ICD-10-CM

## 2024-11-02 DIAGNOSIS — M79672 Pain in left foot: Secondary | ICD-10-CM | POA: Diagnosis not present

## 2024-11-02 DIAGNOSIS — M25572 Pain in left ankle and joints of left foot: Secondary | ICD-10-CM

## 2024-11-02 DIAGNOSIS — M25471 Effusion, right ankle: Secondary | ICD-10-CM | POA: Diagnosis not present

## 2024-11-02 DIAGNOSIS — M25571 Pain in right ankle and joints of right foot: Secondary | ICD-10-CM | POA: Diagnosis not present

## 2024-11-02 MED ORDER — IBUPROFEN 800 MG PO TABS
800.0000 mg | ORAL_TABLET | Freq: Once | ORAL | Status: AC
  Administered 2024-11-02: 800 mg via ORAL

## 2024-11-02 MED ORDER — TRAMADOL HCL 50 MG PO TABS
50.0000 mg | ORAL_TABLET | Freq: Four times a day (QID) | ORAL | 0 refills | Status: AC | PRN
  Filled 2024-11-02: qty 15, 4d supply, fill #0

## 2024-11-02 MED ORDER — DICLOFENAC SODIUM 75 MG PO TBEC
75.0000 mg | DELAYED_RELEASE_TABLET | Freq: Two times a day (BID) | ORAL | 0 refills | Status: DC | PRN
  Filled 2024-11-02: qty 30, 15d supply, fill #0

## 2024-11-02 NOTE — ED Provider Notes (Signed)
 [facility]    CSN: [id number] Arrival date & time: 11/02/24  1042      History   Chief Complaint Chief Complaint  Patient presents with   Ankle Pain    HPI [name] is a 48 y.o. female.   48 year old female who was having breakfast and stood up from breakfast and her foot must have been asleep and she twisted her left ankle and fell.  This all occurred on Monday, 10/30/2024.  She has had lateral ankle swelling and some midfoot swelling and minimal swelling and pain medially.  She felt like this was just a sprained ankle and she has tried to walk on it and use over-the-counter acetaminophen  for pain.  The pain has actually increased as the days have gone by.  She took acetaminophen  on 07/30/2024 and again today at 7 AM.  She has not tried ibuprofen  for the pain.  She has tried ice and elevation.  She denies fever.   Ankle Pain Associated symptoms: no back pain and no fever     Past Medical History:  Diagnosis Date   Abnormal stress test 08/20/2016   Formatting of this note might be different from the original. 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 LIMA to LAD, SVG to PDA in September 2017 at [facility]   Hyperglycemia due to type 2 diabetes mellitus (HCC) 02/25/2016   Hypertension    Liver abscess    2012- Leonce Birk drained placed for awhile.   Long-term insulin  use (HCC) 01/26/2017   Mixed hyperlipidemia 02/25/2016   NSTEMI (non-ST elevated myocardial infarction) (HCC) 06/07/2020   Obesity 09/18/2011   PCOS (polycystic ovarian syndrome) 02/25/2016   Persistent microalbuminuria associated with type 2 diabetes mellitus (HCC) 02/25/2016   Polyneuropathy 05/02/2024   Renal failure 09/25/2011   Due to vancomycin , IV contrast, sepsis.     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  Was hospitalized, intubated, had cardiopulmonary failure.    Tobacco abuse    Type 2 diabetes mellitus with hyperglycemia (HCC) 02/25/2016   Unstable angina (HCC) 06/27/2020    Patient Active Problem List   Diagnosis Date Noted   Mitral regurgitation 10/03/2024   CVA (cerebral vascular accident) [facility])    Atherosclerosis of artery of extremity with rest pain (HCC) 05/05/2024   Entrapment of  left ulnar nerve at elbow 05/02/2024   Polyneuropathy 05/02/2024   Dizziness 08/28/2022   Claudication in peripheral vascular disease 10/29/2021   Acute non-ST segment elevation myocardial infarction (HCC) 03/17/2021   Anxiety disorder 03/17/2021   COVID 10/14/2020   Tobacco abuse    Hypertension    Depression    Cardiomyopathy (HCC)    Anemia    Coronary artery disease 06/27/2020   Unstable angina (HCC) 06/27/2020   Dyspnea on exertion 06/27/2020   Dyslipidemia 06/27/2020   Angina  pectoris 06/27/2020   NSTEMI (non-ST elevated myocardial infarction) (HCC) 06/07/2020   Long-term insulin  use (HCC) 01/26/2017   History of coronary artery bypass graft 09/22/2016   Abnormal stress test 08/20/2016   Calculus of gallbladder 06/05/2016   Mixed hyperlipidemia 02/25/2016   Persistent microalbuminuria associated with type 2 diabetes mellitus (HCC) 02/25/2016   Hyperglycemia due to type 2 diabetes mellitus (HCC) 02/25/2016   Type 2 diabetes mellitus with hyperglycemia (HCC) 02/25/2016   PCOS (polycystic ovarian syndrome) 02/25/2016   Acute appendicitis 01/28/2016   Acute appendicitis with localized peritonitis 01/28/2016   Renal failure 09/25/2011   SIRS due to infectious process with acute organ dysfunction (HCC) 09/18/2011   Obesity 09/18/2011   Liver abscess 08/20/2011   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/20/2011    Past Surgical History:  Procedure Laterality Date   ABDOMINAL AORTOGRAM W/LOWER EXTREMITY N/A 12/29/2021   Procedure: ABDOMINAL AORTOGRAM W/LOWER EXTREMITY;  Surgeon: Court Dorn PARAS, MD;  Location: [facility] INVASIVE CV LAB;  Service: Cardiovascular;  Laterality: N/A;   ABDOMINAL AORTOGRAM W/LOWER EXTREMITY N/A 12/30/2023   Procedure: ABDOMINAL AORTOGRAM W/LOWER EXTREMITY;  Surgeon: Court Dorn PARAS, MD;  Location: [facility] INVASIVE CV LAB;  Service: Cardiovascular;  Laterality: N/A;   APPENDECTOMY     CORONARY ARTERY BYPASS GRAFT  2017   [facility]   CRYOTHERAPY     90's- cervix   DILATION AND CURETTAGE OF UTERUS  1994   ENDARTERECTOMY FEMORAL Left 05/05/2024   Procedure: ENDARTERECTOMY, FEMORAL;  Surgeon: Lanis Fonda BRAVO, MD;  Location: [facility] OR;  Service: Vascular;  Laterality: Left;   LAPAROSCOPIC APPENDECTOMY N/A 01/28/2016   Procedure: APPENDECTOMY LAPAROSCOPIC;  Surgeon: Jina Nephew, MD;  Location: [facility] OR;  Service: General;  Laterality: N/A;   LEFT HEART CATH AND CORS/GRAFTS ANGIOGRAPHY N/A 06/07/2020   Procedure: LEFT HEART CATH AND CORS/GRAFTS  ANGIOGRAPHY;  Surgeon: Jordan, Peter M, MD;  Location: [facility] INVASIVE CV LAB;  Service: Cardiovascular;  Laterality: N/A;   PATCH ANGIOPLASTY Left 05/05/2024   Procedure: ANGIOPLASTY, USING GEORGE BIOLOGIC PATCH;  Surgeon: Lanis Fonda BRAVO, MD;  Location: [facility] OR;  Service: Vascular;  Laterality: Left;   PERIPHERAL VASCULAR INTERVENTION  12/29/2021   Procedure: PERIPHERAL VASCULAR INTERVENTION;  Surgeon: Court Dorn PARAS, MD;  Location: [facility] INVASIVE CV LAB;  Service: Cardiovascular;;  Rt. Iliac   ROBOTIC ASSISTED SALPINGO OOPHERECTOMY Right 03/24/2016   Procedure: XI ROBOTIC ASSISTED LAPAROSCOPIC RIGHT SALPINGO OOPHORECTOMY;  Surgeon: Maurilio Ship, MD;  Location: [facility] ORS;  Service: Gynecology;  Laterality: Right;   TONSILLECTOMY      OB History     Gravida  5   Para  4   Term  3   Preterm  1   AB  1   Living  4      SAB  1   IAB      Ectopic      Multiple      Live Births  1  Home Medications    Prior to Admission medications   Medication Sig Start Date End Date Taking? Authorizing Provider  furosemide  (LASIX ) 40 MG tablet Take 40 mg by mouth 2 (two) times daily. 10/29/24  Yes [provider]  isosorbide  mononitrate (IMDUR ) 30 MG 24 hr tablet Take 30 mg by mouth every morning. 10/29/24  Yes [provider]  spironolactone (ALDACTONE) 25 MG tablet Take 25 mg by mouth daily. 10/29/24  Yes [provider]  traMADol (ULTRAM) 50 MG tablet Take 1 tablet (50 mg total) by mouth every 6 (six) hours as needed. 11/02/24  Yes Ival Domino, FNP  albuterol  (VENTOLIN  HFA) 108 (90 Base) MCG/ACT inhaler Inhale 2 puffs into the lungs every 6 (six) hours as needed for wheezing or shortness of breath.    [provider]  aspirin  EC 81 MG tablet Take 1 tablet (81 mg total) by mouth daily. Swallow whole. 06/27/20   Krasowski, Robert J, MD  atorvastatin  (LIPITOR ) 80 MG tablet Take 1 tablet (80 mg total) by mouth daily. 02/09/24 10/03/24  Krasowski,  Robert J, MD  carvedilol  (COREG ) 25 MG tablet Take 1 tablet (25 mg total) by mouth 2 (two) times daily with a meal. 07/26/24   Carlin Delon BROCKS, NP  clopidogrel  (PLAVIX ) 75 MG tablet Take 75 mg by mouth daily.    [provider]  empagliflozin  (JARDIANCE ) 25 MG TABS tablet Take 25 mg by mouth daily.    [provider]  escitalopram (LEXAPRO) 10 MG tablet Take 10 mg by mouth daily.    [provider]  gabapentin  (NEURONTIN ) 300 MG capsule Take 300 mg by mouth 3 (three) times daily.    [provider]  glipiZIDE  (GLUCOTROL  XL) 10 MG 24 hr tablet Take 10 mg by mouth daily. 02/18/24   [provider]  insulin  lispro (HUMALOG) 100 UNIT/ML KwikPen Inject 0-100 Units into the skin as directed. Via pump    [provider]  metFORMIN  (GLUCOPHAGE -XR) 500 MG 24 hr tablet Take 2 tablets (1,000 mg total) by mouth 2 (two) times daily. 12/31/21   Court Dorn PARAS, MD  nicotine  (NICODERM CQ  - DOSED IN MG/24 HOURS) 21 mg/24hr patch Place 21 mg onto the skin daily.    [provider]  nitroGLYCERIN  (NITROSTAT ) 0.4 MG SL tablet DISSOLVE ONE TABLET UNDER THE TONGUE EVERY 5 MINUTES AS NEEDED 11/27/22   Krasowski, Robert J, MD  NOVOLIN 70/30 KWIKPEN (70-30) 100 UNIT/ML KwikPen Inject 0-100 Units into the skin as directed. Via pump 01/06/24   [provider]  OZEMPIC, 0.25 OR 0.5 MG/DOSE, 2 MG/3ML SOPN Inject 2.5 mg into the skin once a week. 09/05/24   [provider]  ENTRESTO 24-26 MG Take 1 tablet by mouth 2 (two) times daily. *brand medically necessary* 10/19/24   Krasowski, Robert J, MD  torsemide  (DEMADEX ) 20 MG tablet Take 20 mg by mouth daily.    [provider]    Family History Family History  Problem Relation Age of Onset   CAD Mother    CAD Father    Diabetes Maternal Grandmother    Diabetes Paternal Grandfather     Social History Social History   Tobacco Use   Smoking status: Every Day    Current packs/day:  0.50    Average packs/day: 0.5 packs/day for 0.8 years (0.4 ttl pk-yrs)    Types: Cigarettes    Start date: 01/22/2024   Smokeless tobacco: Never   Tobacco comments:    Taking Chantix to help  with smoking cessation 08/31/24  Vaping Use   Vaping status: Never Used  Substance Use Topics   Alcohol use: Yes    Comment: rare social   Drug use: No    Comment: x1 episode Meth use.     Allergies   Patient has no known allergies.   Review of Systems Review of Systems  Constitutional:  Negative for fever.  Respiratory:  Negative for cough.   Cardiovascular:  Negative for chest pain.  Gastrointestinal:  Negative for abdominal pain, constipation, diarrhea, nausea and vomiting.  Musculoskeletal:  Positive for joint swelling (Left ankle and foot pain and swelling.). Negative for arthralgias and back pain.  Skin:  Negative for color change and rash.  Neurological:  Negative for syncope.  All other systems reviewed and are negative.    Physical Exam Triage Vital Signs ED Triage Vitals  Encounter Vitals Group     BP 11/02/24 1114 (!) 156/72     Girls Systolic BP Percentile --      Girls Diastolic BP Percentile --      Boys Systolic BP Percentile --      Boys Diastolic BP Percentile --      Pulse Rate 11/02/24 1114 95     Resp 11/02/24 1114 20     Temp 11/02/24 1114 98.2 F (36.8 C)     Temp Source 11/02/24 1114 Oral     SpO2 11/02/24 1114 95 %     Weight --      Height --      Head Circumference --      Peak Flow --      Pain Score 11/02/24 1115 8     Pain Loc --      Pain Education --      Exclude from Growth Chart --    No data found.  Updated Vital Signs BP (!) 156/72 (BP Location: Right Arm)   Pulse 95   Temp 98.2 F (36.8 C) (Oral)   Resp 20   SpO2 95%   Visual Acuity Right Eye Distance:   Left Eye Distance:   Bilateral Distance:    Right Eye Near:   Left Eye Near:    Bilateral Near:     Physical Exam Vitals and nursing note reviewed.  Constitutional:       General: She is not in acute distress.    Appearance: She is well-developed. She is not ill-appearing or toxic-appearing.  HENT:     Head: Normocephalic and atraumatic.     Right Ear: External ear normal.     Left Ear: External ear normal.     Nose: Nose normal.     Mouth/Throat:     Lips: Pink.     Mouth: Mucous membranes are moist.  Eyes:     Conjunctiva/sclera: Conjunctivae normal.     Pupils: Pupils are equal, round, and reactive to light.  Cardiovascular:     Rate and Rhythm: Normal rate and regular rhythm.     Pulses:          Dorsalis pedis pulses are 2+ on the right side and 2+ on the left side.       Posterior tibial pulses are 2+ on the right side and 2+ on the left side.     Heart sounds: S1 normal and S2 normal. No murmur heard. Pulmonary:     Effort: Pulmonary effort is normal. No respiratory distress.     Breath sounds: Normal breath sounds. No decreased breath sounds, wheezing,  rhonchi or rales.  Musculoskeletal:        General: No swelling.     Right lower leg: Normal.     Left lower leg: Normal.     Right ankle: Normal.     Left ankle: Swelling (Most of the swelling is laterally but there is some medial and midfoot swelling) and ecchymosis (Minimal amount laterally) present. No deformity or lacerations. Tenderness present over the lateral malleolus and medial malleolus. Decreased range of motion (Some decreased range of motion (flexion and extension) due to pain and swelling.).     Left Achilles Tendon: Normal.     Right foot: Normal.     Left foot: Decreased range of motion (Due to pain and swelling.). Normal capillary refill. Swelling (At the midfoot and forefoot anteriorly.  And laterally near the ankle.), tenderness (Midfoot, forefoot and laterally near the ankle.) and bony tenderness (Midfoot and forefoot with more pain laterally near the ankle.) present. No deformity, bunion, Charcot foot, foot drop, prominent metatarsal heads, laceration or crepitus. Normal  pulse.  Skin:    General: Skin is warm and dry.     Capillary Refill: Capillary refill takes less than 2 seconds.     Findings: No rash.  Neurological:     Mental Status: She is alert and oriented to person, place, and time.  Psychiatric:        Mood and Affect: Mood normal.           UC Treatments / Results  Labs (all labs ordered are listed, but only abnormal results are displayed) Comprehensive metabolic panel with GFR: 07/26/24:    Component Ref Range & Units (hover) 3 mo ago (07/26/24)  Glucose 298 High   BUN 11  Creatinine, Ser 0.83  eGFR 86  BUN/Creatinine Ratio 13  Sodium 138  Potassium 4.4  Chloride 100  CO2 23  Calcium  9.3  Total Protein 6.2  Albumin  4.0  Globulin, Total 2.2  Bilirubin Total 0.4  Alkaline Phosphatase 79  AST 13  ALT 9  Resulting Agency [facility]      EKG   Radiology No results found.  Procedures Procedures (including critical care time)  Medications Ordered in UC Medications  ibuprofen  (ADVIL ) tablet 800 mg (800 mg Oral Given 11/02/24 1135)    Initial Impression / Assessment and Plan / UC Course  I have reviewed the triage vital signs and the nursing notes.  Pertinent labs & imaging results that were available during my care of the patient were reviewed by me and considered in my medical decision making (see chart for details).  Plan of Care: Closed fracture of the left fifth metatarsal bone with pain and swelling of the left foot and ankle: Cam boot in place.  Reviewed x-rays with the patient.  Will update the patient if the radiology review differs.  Encouraged RICE therapy.  Encouraged to see [facility] ASAP for further evaluation and management.  Patient is on the blood thinner clopidogrel .  Due to blood thinner use, will limit use of NSAIDs.  Had originally prescribed diclofenac but I canceled the diclofenac.  We use tramadol 50 mg every 6 hours if needed for pain.  Follow-up with orthopedics as soon as possible.   Return here if needed.   I reviewed the plan of care with the patient and/or the patient's guardian.  The patient and/or guardian had time to ask questions and acknowledged that the questions were answered.  I provided instruction on symptoms or reasons to return here or to  go to an ER, if symptoms/condition did not improve, worsened or if new symptoms occurred.  Final Clinical Impressions(s) / UC Diagnoses   Final diagnoses:  Pain and swelling of right ankle  Closed nondisplaced fracture of fifth metatarsal bone of left foot, initial encounter  Left foot pain     Discharge Instructions      Closed fracture of the left fifth metatarsal bone with pain and swelling of the left foot and ankle: Cam boot in place.  Reviewed x-rays with the patient.  Will update the patient if the radiology review differs.  Encouraged RICE therapy.  Encouraged to see [facility] ASAP for further evaluation and management.  Patient is on the blood thinner clopidogrel .  Due to blood thinner use, will limit use of NSAIDs.  Had originally prescribed diclofenac but I canceled the diclofenac.  We use tramadol 50 mg every 6 hours if needed for pain.  Follow-up with orthopedics as soon as possible.  Return here if needed.     ED Prescriptions     Medication Sig Dispense Auth. Provider   diclofenac (VOLTAREN) 75 MG EC tablet  (Status: Discontinued) Take 1 tablet (75 mg total) by mouth every 12 (twelve) hours as needed for up to 15 days (take with food for back or other pain). 30 tablet Mance Vallejo, FNP   traMADol (ULTRAM) 50 MG tablet Take 1 tablet (50 mg total) by mouth every 6 (six) hours as needed. 15 tablet Faelyn Sigler, FNP      I have reviewed the PDMP during this encounter.   Ival Domino, FNP 11/02/24 1219

## 2024-11-02 NOTE — Discharge Instructions (Addendum)
 Closed fracture of the left fifth metatarsal bone with pain and swelling of the left foot and ankle: Cam boot in place.  Reviewed x-rays with the patient.  Will update the patient if the radiology review differs.  Encouraged RICE therapy.  Encouraged to see [facility] ASAP for further evaluation and management.  Patient is on the blood thinner clopidogrel .  Due to blood thinner use, will limit use of NSAIDs.  Had originally prescribed diclofenac but I canceled the diclofenac.  We use tramadol 50 mg every 6 hours if needed for pain.  Follow-up with orthopedics as soon as possible.  Return here if needed.

## 2024-11-02 NOTE — ED Triage Notes (Signed)
 Had a fall on Monday, twisted ankle. Wearing slippers. Swelling to ankle and lateral aspect of left foot. Took tylenol  for pain of 8/10 at 7am today.

## 2024-11-07 ENCOUNTER — Encounter: Payer: Self-pay | Admitting: Vascular Surgery

## 2024-11-07 NOTE — Progress Notes (Unsigned)
 Subjective:     Patient ID: [name], female   DOB: November 01, 1976, 48 y.o.   MRN: [id number]  HPI 48 year old female with history of recent left iliofemoral endarterectomy for occluded left common femoral artery.  This was done for rest pain.  Postoperatively, she was doing well.  She had significant improvement in ABI and TBI.  Symptoms in the left lower extremity abated.  She had little to have any claudication.  She noted some medial thigh pain which was thought to be neurogenic, and she was referred back to our office from her primary care to ensure that she did not have a blood clot.  The DVT study was negative, however the study demonstrated thrombus on the endarterectomy patch.  She was placed on anticoagulation and CT scan was obtained.  CT scan demonstrated no thrombus on the patch, however occluded superficial femoral artery.  At the time, [name] had some left lower extremity claudication symptoms, but no rest pain, no ulceration.  We discussed the best course of action was continued medical management due to her young age.  We also discussed the importance of smoking cessation in effort to prevent PAD progression.  She presents today after fall roughly 1 week ago.  She fell resulting in mildly displaced fracture of the base of the fifth metatarsal.  She has had significant edema, and ecchymosis.  She was concerned about the ecchymosis, and wanted to be evaluated due to her arterial insufficiency.  On exam, she was doing well.  She noted pain at the level of the left ankle.  She was wearing a boot, and using crutches to ambulate.  Stated ecchymosis had traveled along the lateral aspect of the left foot, and was present at the proximal phalange of the toes.  Denied rest pain, denied tissue loss.  She continues to smoke 1/2 pack/day.  Remains compliant on aspirin , Plavix . Has known severe mitral regurgitation.     Review of Systems Left medial leg pain    Objective:   Physical  Exam Constitutional:      Appearance: Normal appearance.  HENT:     Head: Normocephalic.  Cardiovascular:     Rate and Rhythm: Normal rate.  Pulmonary:     Effort: Pulmonary effort is normal.  Abdominal:     General: There is no distension.  Musculoskeletal:        General: Swelling, tenderness and signs of injury present.     Comments: Left ankle, specifically medial aspect and forefoot  Skin:    General: Skin is warm and dry.  Neurological:     Mental Status: She is alert and oriented to person, place, and time.    There were no vitals filed for this visit.  Good capillary refill in the left foot there is ecchymosis appreciated.  No ulceration.  There is also edema in the left foot. Monophasic DP signal, strong monophasic/multiphasic posterior tibial artery signal       ABI Findings:   +-------+-----------+----------------+------------+------------+  ABI/TBIToday's ABIToday's TBI     Previous ABIPrevious TBI  +-------+-----------+----------------+------------+------------+  Right 0.58       0.34            0.72        0.57          +-------+-----------+----------------+------------+------------+  Left  0.39       unable to obtain0.62        0.15          +-------+-----------+----------------+------------+------------+  Assessment/plan     48 year old female here in follow-up with history of left iliofemoral endarterectomy with subsequent occlusion of the left superficial femoral artery.  Continues to have some claudication, but recently fell with ecchymosis in the foot.  X-ray demonstrates mildly displaced fracture of the base of the fifth metatarsal.  She is currently in a boot, nonweightbearing.  No rest pain, no tissue loss.  On physical exam, had strong monophasic signals in the DP and PT arteries.  No new symptoms. ABIs decreased bilaterally which makes me question validity.    I had a long conversation with [name] regarding her left foot.   I am happy that she does not have any wounds, and continues to deny rest pain.  Should she require operative intervention in the foot, I think that she would first need revascularization because I do not think a surgical incision would heal with current arterial perfusion.  We discussed that should a new wound develop, or rest pain occur, I would like to see her back in the office immediately as these would be indications for further intervention.  She is aware that the age of 15, the best course of action is continued medical management for as long as possible.  We discussed the importance of smoking cessation.  She is working on this.  I asked that she continue her current medication regimen.  I plan to see her in 3 months time.  [name]

## 2024-11-09 ENCOUNTER — Encounter: Payer: Self-pay | Admitting: Vascular Surgery

## 2024-11-09 ENCOUNTER — Ambulatory Visit (HOSPITAL_COMMUNITY)
Admission: RE | Admit: 2024-11-09 | Discharge: 2024-11-09 | Disposition: A | Discharge status: Home / Self Care | Source: Ambulatory Visit | Attending: Physician Assistant | Admitting: Physician Assistant

## 2024-11-09 ENCOUNTER — Ambulatory Visit (INDEPENDENT_AMBULATORY_CARE_PROVIDER_SITE_OTHER): Admitting: Vascular Surgery

## 2024-11-09 VITALS — BP 131/73 | HR 73 | Temp 98.2°F | Resp 18 | Ht 67.0 in | Wt 202.0 lb

## 2024-11-09 DIAGNOSIS — I70229 Atherosclerosis of native arteries of extremities with rest pain, unspecified extremity: Secondary | ICD-10-CM | POA: Insufficient documentation

## 2024-11-09 DIAGNOSIS — I739 Peripheral vascular disease, unspecified: Secondary | ICD-10-CM | POA: Diagnosis present

## 2024-11-09 DIAGNOSIS — S9032XA Contusion of left foot, initial encounter: Secondary | ICD-10-CM

## 2024-11-09 LAB — VAS US ABI WITH/WO TBI
Left ABI: 0.39
Right ABI: 0.58

## 2024-11-13 ENCOUNTER — Other Ambulatory Visit: Payer: Self-pay | Admitting: *Deleted

## 2024-11-13 DIAGNOSIS — I70229 Atherosclerosis of native arteries of extremities with rest pain, unspecified extremity: Secondary | ICD-10-CM

## 2024-11-21 ENCOUNTER — Encounter (HOSPITAL_COMMUNITY)

## 2024-11-21 ENCOUNTER — Ambulatory Visit

## 2024-12-05 ENCOUNTER — Encounter: Payer: Self-pay | Admitting: Cardiology

## 2024-12-05 ENCOUNTER — Ambulatory Visit: Attending: Cardiology | Admitting: Cardiology

## 2024-12-05 VITALS — BP 138/78 | HR 93 | Ht 67.0 in | Wt 204.0 lb

## 2024-12-05 DIAGNOSIS — I255 Ischemic cardiomyopathy: Secondary | ICD-10-CM | POA: Insufficient documentation

## 2024-12-05 DIAGNOSIS — Z951 Presence of aortocoronary bypass graft: Secondary | ICD-10-CM

## 2024-12-05 DIAGNOSIS — I209 Angina pectoris, unspecified: Secondary | ICD-10-CM | POA: Insufficient documentation

## 2024-12-05 DIAGNOSIS — I340 Nonrheumatic mitral (valve) insufficiency: Secondary | ICD-10-CM | POA: Diagnosis present

## 2024-12-05 DIAGNOSIS — E782 Mixed hyperlipidemia: Secondary | ICD-10-CM | POA: Diagnosis present

## 2024-12-05 DIAGNOSIS — I10 Essential (primary) hypertension: Secondary | ICD-10-CM | POA: Insufficient documentation

## 2024-12-05 NOTE — Progress Notes (Signed)
 Cardiology Office Note:    Date:  12/05/2024   ID:  MARYGRACE Tate, DOB [birth year], MRN [id number]  PCP:  Duwaine Burnard Amble, NP  Cardiologist:  Lamar Fitch, MD    Referring MD: Duwaine Burnard Amble, NP   Chief Complaint  Patient presents with   Follow-up  Doing fine  History of Present Illness:    [name] is a 48 y.o. female with a complex past medical history in 2017 she had coronary bypass graft done with LIMA to LAD SVG to RCA, also essential hypertension, type 2 diabetes smoking, in 2021 she got cardiac catheterization do not reveal complete lobular LAD however LIMA to LAD was patent, SVG graft to RCA was completely occluded and also RCA had complete occlusion in mid section, there was also 70% mid to distal obtuse marginal branch, recently she had up in the hospital because of shortness of breath chest pain she was found to have diminished ejection fraction with different numbers given between 35 to 45% ejection fraction, also mitral regurgitation and sometimes assess her severe last assessment was moderate.  She was treated appropriately in the hospital stress test was done which showed ischemia involving inferior wall which is correlating with her completely occluded right coronary artery and graft going to RCA.  She comes today to months for follow-up.  Overall doing fine.  Denies have any chest pain tightness squeezing pressure burning chest, she fell down trip to after being discharged from the hospital last time and had frank broken left ankle.  Also today she goes to dentist to have multiple teeth extraction.  Denies have any chest pain tightness squeezing pressure burning chest no shortness of breath  Past Medical History:  Diagnosis Date   Abnormal stress test 08/20/2016   Formatting of this note might be different from the original. Added automatically from request for surgery [id number]   Acute appendicitis 01/28/2016   Acute appendicitis with localized peritonitis  01/28/2016   Acute non-ST segment elevation myocardial infarction (HCC) 03/17/2021   Anemia    Angina pectoris 06/27/2020   Anxiety disorder 03/17/2021   Atherosclerosis of artery of extremity with rest pain (HCC) 05/05/2024   Calculus of gallbladder 06/05/2016   Cardiomyopathy (HCC)    postpartem cardiomyopathy- 2012- has since been released by cardiology   Claudication in peripheral vascular disease 10/29/2021   Peripheral arterial disease     Coronary artery disease 06/27/2020   COVID 10/14/2020   c/o cough and chest pressure    CVA (cerebral vascular accident) (HCC)    Depression    Dizziness 08/28/2022   Dyslipidemia 06/27/2020   Dyspnea on exertion 06/27/2020   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 LIMA to LAD, SVG to PDA in September 2017 at [facility]   Hyperglycemia due to type 2 diabetes mellitus (HCC) 02/25/2016   Hypertension    Liver abscess    2012- Leonce Birk drained placed for awhile.   Long-term insulin  use (HCC) 01/26/2017   Mixed hyperlipidemia 02/25/2016   NSTEMI (non-ST elevated myocardial infarction) (HCC) 06/07/2020   Obesity 09/18/2011   PCOS (polycystic ovarian syndrome) 02/25/2016   Persistent microalbuminuria associated with type 2 diabetes mellitus (HCC) 02/25/2016   Polyneuropathy 05/02/2024   Renal failure 09/25/2011   Due to vancomycin , IV contrast, sepsis.     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/20/2011   7  cm right ovarian dermoid cyst - will schedule surgery after pregnancy    SIRS due to infectious process with acute organ dysfunction (HCC) 09/18/2011   Multiorgan failure secondary to sepsis after IR drainage of liver abscess.  Was hospitalized, intubated, had cardiopulmonary failure.    Tobacco abuse    Type 2 diabetes mellitus with hyperglycemia (HCC) 02/25/2016   Unstable angina (HCC)  06/27/2020    Past Surgical History:  Procedure Laterality Date   ABDOMINAL AORTOGRAM W/LOWER EXTREMITY N/A 12/29/2021   Procedure: ABDOMINAL AORTOGRAM W/LOWER EXTREMITY;  Surgeon: Court Dorn PARAS, MD;  [facility];  Service: Cardiovascular;  Laterality: N/A;   ABDOMINAL AORTOGRAM W/LOWER EXTREMITY N/A 12/30/2023   Procedure: ABDOMINAL AORTOGRAM W/LOWER EXTREMITY;  Surgeon: Court Dorn PARAS, MD;  [facility];  Service: Cardiovascular;  Laterality: N/A;   APPENDECTOMY     CORONARY ARTERY BYPASS GRAFT  2017   [facility]   CRYOTHERAPY     90's- cervix   DILATION AND CURETTAGE OF UTERUS  1994   ENDARTERECTOMY FEMORAL Left 05/05/2024   Procedure: ENDARTERECTOMY, FEMORAL;  Surgeon: Lanis Fonda BRAVO, MD;  Location: [facility] OR;  Service: Vascular;  Laterality: Left;   LAPAROSCOPIC APPENDECTOMY N/A 01/28/2016   Procedure: APPENDECTOMY LAPAROSCOPIC;  Surgeon: Jina Nephew, MD;  [facility];  Service: General;  Laterality: N/A;   LEFT HEART CATH AND CORS/GRAFTS ANGIOGRAPHY N/A 06/07/2020   Procedure: LEFT HEART CATH AND CORS/GRAFTS ANGIOGRAPHY;  Surgeon: Jordan, Peter M, MD;  [facility];  Service: Cardiovascular;  Laterality: N/A;   PATCH ANGIOPLASTY Left 05/05/2024   Procedure: ANGIOPLASTY, USING GEORGE BIOLOGIC PATCH;  Surgeon: Lanis Fonda BRAVO, MD;  Location: [facility] OR;  Service: Vascular;  Laterality: Left;   PERIPHERAL VASCULAR INTERVENTION  12/29/2021   Procedure: PERIPHERAL VASCULAR INTERVENTION;  Surgeon: Court Dorn PARAS, MD;  [facility];  Service: Cardiovascular;;  Rt. Iliac   ROBOTIC ASSISTED SALPINGO OOPHERECTOMY Right 03/24/2016   Procedure: XI ROBOTIC ASSISTED LAPAROSCOPIC RIGHT SALPINGO OOPHORECTOMY;  Surgeon: Maurilio Ship, MD;  Location: [facility];  Service: Gynecology;  Laterality: Right;   TONSILLECTOMY      Current Medications: Active Medications[1]   Allergies:   Patient has no known allergies.    Social History   Socioeconomic History   Marital status: Single    Spouse name: Not on file   Number of children: Not on file   Years of education: Not on file   Highest education level: Not on file  Occupational History   Not on file  Tobacco Use   Smoking status: Every Day    Current packs/day: 0.50    Average packs/day: 0.5 packs/day for 0.9 years (0.4 ttl pk-yrs)    Types: Cigarettes    Start date: 01/22/2024   Smokeless tobacco: Never   Tobacco comments:    Taking Chantix to help with smoking cessation 08/31/24  Vaping Use   Vaping status: Never Used  Substance and Sexual Activity   Alcohol use: Yes    Comment: rare social   Drug use: No    Comment: x1 episode Meth use.   Sexual activity: Not Currently    Birth control/protection: None  Other Topics Concern   Not on file  Social History Narrative   Not on file   Social Drivers of Health   Tobacco Use: High Risk (12/05/2024)   Patient History    Smoking Tobacco Use: Every Day    Smokeless Tobacco Use: Never  Passive Exposure: Not on file  Financial Resource Strain: Not on file  Food Insecurity: Patient Declined (05/06/2024)   Hunger Vital Sign    Worried About Running Out of Food in the Last Year: Patient declined    Ran Out of Food in the Last Year: Patient declined  Transportation Needs: No Transportation Needs (05/06/2024)   PRAPARE - Administrator, Civil Service (Medical): No    Lack of Transportation (Non-Medical): No  Physical Activity: Not on file  Stress: Not on file  Social Connections: Not on file  Depression (EYV7-0): Not on file  Alcohol Screen: Not on file  Housing: Unknown (05/06/2024)   Housing Stability Vital Sign    Unable to Pay for Housing in the Last Year: No    Number of Times Moved in the Last Year: Not on file    Homeless in the Last Year: No  Utilities: Not At Risk (05/06/2024)   AHC Utilities    Threatened with loss of utilities: No  Health Literacy: Not on file      Family History: The patient's family history includes CAD in her father and mother; Diabetes in her maternal grandmother and paternal grandfather. ROS:   Please see the history of present illness.    All 14 point review of systems negative except as described per history of present illness  EKGs/Labs/Other Studies Reviewed:         Recent Labs: 07/26/2024: ALT 9; BUN 11; Creatinine, Ser 0.83; NT-Pro BNP 1,251; Platelets 162; Potassium 4.4; Sodium 138; TSH 1.190 09/25/2024: Hemoglobin 16.8  Recent Lipid Panel    Component Value Date/Time   CHOL 121 05/06/2024 0012   CHOL 99 (L) 12/02/2021 0926   TRIG 127 05/06/2024 0012   HDL 24 (L) 05/06/2024 0012   HDL 30 (L) 12/02/2021 0926   CHOLHDL 5.0 05/06/2024 0012   VLDL 25 05/06/2024 0012   LDLCALC 72 05/06/2024 0012   LDLCALC 45 12/02/2021 0926    Physical Exam:    VS:  BP 138/78   Pulse 93   Ht 5' 7 (1.702 m)   Wt 204 lb (92.5 kg)   SpO2 96%   BMI 31.95 kg/m     Wt Readings from Last 3 Encounters:  12/05/24 204 lb (92.5 kg)  11/09/24 202 lb (91.6 kg)  10/03/24 202 lb 9.6 oz (91.9 kg)     GEN:  Well nourished, well developed in no acute distress HEENT: Normal NECK: No JVD; No carotid bruits LYMPHATICS: No lymphadenopathy CARDIAC: RRR, systolic murmur grade 1/6 to 2/6 basilar left border of sternum, no rubs, no gallops RESPIRATORY:  Clear to auscultation without rales, wheezing or rhonchi  ABDOMEN: Soft, non-tender, non-distended MUSCULOSKELETAL:  No edema; No deformity  SKIN: Warm and dry LOWER EXTREMITIES: no swelling NEUROLOGIC:  Alert and oriented x 3 PSYCHIATRIC:  Normal affect   ASSESSMENT:    1. Angina pectoris   2. Ischemic cardiomyopathy   3. Nonrheumatic mitral valve regurgitation   4. History of coronary artery bypass graft   5. Mixed hyperlipidemia    PLAN:    In order of problems listed above:  Angina pectoris, denies having any, she is treated with Imdur  with good response, stress test  done in a hospital show ischemia involving inferior wall which correlates with known completely occluded right coronary artery and graft going to right coronary artery, she does have however good collateralization.  And now she is stable. Ischemic cardiomyopathy will get Chem-7 to see if can increase  dose of Entresto .  She is hemodynamically compensated. Nonrheumatic mitral valve regurgitation assessed as moderate continue monitoring. Mixed dyslipidemia will continue present management for now   Medication Adjustments/Labs and Tests Ordered: Current medicines are reviewed at length with the patient today.  Concerns regarding medicines are outlined above.  No orders of the defined types were placed in this encounter.  Medication changes: No orders of the defined types were placed in this encounter.   Signed, Lamar DOROTHA Fitch, MD, [facility] 12/05/2024 9:06 AM    [facility]    [1]  Current Meds  Medication Sig   albuterol  (VENTOLIN  HFA) 108 (90 Base) MCG/ACT inhaler Inhale 2 puffs into the lungs every 6 (six) hours as needed for wheezing or shortness of breath.   aspirin  EC 81 MG tablet Take 1 tablet (81 mg total) by mouth daily. Swallow whole.   atorvastatin  (LIPITOR ) 80 MG tablet Take 1 tablet (80 mg total) by mouth daily.   carvedilol  (COREG ) 25 MG tablet Take 1 tablet (25 mg total) by mouth 2 (two) times daily with a meal.   clopidogrel  (PLAVIX ) 75 MG tablet Take 75 mg by mouth daily.   empagliflozin  (JARDIANCE ) 25 MG TABS tablet Take 25 mg by mouth daily.   ENTRESTO  24-26 MG Take 1 tablet by mouth 2 (two) times daily. *brand medically necessary*   escitalopram (LEXAPRO) 10 MG tablet Take 10 mg by mouth daily.   furosemide  (LASIX ) 40 MG tablet Take 40 mg by mouth 2 (two) times daily.   gabapentin  (NEURONTIN ) 300 MG capsule Take 300 mg by mouth 3 (three) times daily.   glipiZIDE  (GLUCOTROL  XL) 10 MG 24 hr tablet Take 10 mg by mouth daily.   Insulin  Disposable  Pump (OMNIPOD 5 DEXG7G6 PODS GEN 5) MISC Inject 1 Dose into the skin every 3 (three) days.   insulin  lispro (HUMALOG) 100 UNIT/ML KwikPen Inject 0-100 Units into the skin as directed. Via pump   isosorbide  mononitrate (IMDUR ) 30 MG 24 hr tablet Take 30 mg by mouth every morning.   metFORMIN  (GLUCOPHAGE -XR) 500 MG 24 hr tablet Take 2 tablets (1,000 mg total) by mouth 2 (two) times daily.   nicotine  (NICODERM CQ  - DOSED IN MG/24 HOURS) 21 mg/24hr patch Place 21 mg onto the skin daily.   nitroGLYCERIN  (NITROSTAT ) 0.4 MG SL tablet DISSOLVE ONE TABLET UNDER THE TONGUE EVERY 5 MINUTES AS NEEDED   NOVOLIN 70/30 KWIKPEN (70-30) 100 UNIT/ML KwikPen Inject 0-100 Units into the skin as directed. Via pump   Semaglutide,0.25 or 0.5MG /DOS, (OZEMPIC, 0.25 OR 0.5 MG/DOSE,) 2 MG/1.5ML SOPN Inject 0.5 mg into the skin every 7 (seven) days.   spironolactone (ALDACTONE) 25 MG tablet Take 25 mg by mouth daily.   traMADol  (ULTRAM ) 50 MG tablet Take 1 tablet (50 mg total) by mouth every 6 (six) hours as needed.

## 2024-12-05 NOTE — Addendum Note (Signed)
 Addended by: ARLOA PLANAS D on: 12/05/2024 09:14 AM   Modules accepted: Orders

## 2024-12-05 NOTE — Patient Instructions (Signed)
Medication Instructions:  Your physician recommends that you continue on your current medications as directed. Please refer to the Current Medication list given to you today.  *If you need a refill on your cardiac medications before your next appointment, please call your pharmacy*   Lab Work: BMP today If you have labs (blood work) drawn today and your tests are completely normal, you will receive your results only by: [place] (if you have MyChart) OR A paper copy in the mail If you have any lab test that is abnormal or we need to change your treatment, we will call you to review the results.   Testing/Procedures: None Ordered   Follow-Up: At [facility], you and your health needs are our priority.  As part of our continuing mission to provide you with exceptional heart care, we have created designated Provider Care Teams.  These Care Teams include your primary Cardiologist (physician) and Advanced Practice Providers (APPs -  Physician Assistants and Nurse Practitioners) who all work together to provide you with the care you need, when you need it.  We recommend signing up for the patient portal called "MyChart".  Sign up information is provided on this After Visit Summary.  MyChart is used to connect with patients for Virtual Visits (Telemedicine).  Patients are able to view lab/test results, encounter notes, upcoming appointments, etc.  Non-urgent messages can be sent to your provider as well.   To learn more about what you can do with MyChart, go to [email or web address].    Your next appointment:   5 month(s)  The format for your next appointment:   In Person  Provider:   Jenne Campus, MD    Other Instructions NA

## 2024-12-06 LAB — BASIC METABOLIC PANEL WITH GFR
BUN/Creatinine Ratio: 14 (ref 9–23)
BUN: 11 mg/dL (ref 6–24)
CO2: 24 mmol/L (ref 20–29)
Calcium: 9.2 mg/dL (ref 8.7–10.2)
Chloride: 97 mmol/L (ref 96–106)
Creatinine, Ser: 0.76 mg/dL (ref 0.57–1.00)
Glucose: 211 mg/dL — ABNORMAL HIGH (ref 70–99)
Potassium: 4.3 mmol/L (ref 3.5–5.2)
Sodium: 136 mmol/L (ref 134–144)
eGFR: 95 mL/min/1.73 (ref >59)

## 2024-12-07 NOTE — Progress Notes (Unsigned)
 [facility] Pharmacist Note  Name: [name]  MRN: [id number]  DOB: Oct 27, 1976  Sex: female PCP: Duwaine Burnard Amble, NP CPP Referral Provider: Dr. Lanis  HISTORY OF PRESENT ILLNESS: [name] is a 48 y.o. female with PMH symptomatic PAD s/p iliofemoral endarterectomy for occluded left common femoral artery in May 2025, stroke, CAD s/p CABG in 2017 who presents for medication management for cardiovascular risk reduction.   ***  Current dietary habits:   Breakfast: ***  Lunch: ***  Supper: ***  Snacks: ***  Drinks: ***   Current physical activity: ***   Patient {ACTION; IS/IS NOT:21021397} up to date on annual influenza vaccine.  Patient {ACTION; IS/IS NOT:21021397} up to date on COVID vaccines.   Past Medical History:  Diagnosis Date   Abnormal stress test 08/20/2016   Formatting of this note might be different from the original. Added automatically from request for surgery [id number]   Acute appendicitis 01/28/2016   Acute appendicitis with localized peritonitis 01/28/2016   Acute non-ST segment elevation myocardial infarction (HCC) 03/17/2021   Anemia    Angina pectoris 06/27/2020   Anxiety disorder 03/17/2021   Atherosclerosis of artery of extremity with rest pain (HCC) 05/05/2024   Calculus of gallbladder 06/05/2016   Cardiomyopathy (HCC)    postpartem cardiomyopathy- 2012- has since been released by cardiology   Claudication in peripheral vascular disease 10/29/2021   Peripheral arterial disease     Coronary artery disease 06/27/2020   COVID 10/14/2020   c/o cough and chest pressure    CVA (cerebral vascular accident) (HCC)    Depression    Dizziness 08/28/2022   Dyslipidemia 06/27/2020   Dyspnea on exertion 06/27/2020   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 LIMA to LAD, SVG to PDA in September 2017 at [facility]    Hyperglycemia due to type 2 diabetes mellitus (HCC) 02/25/2016   Hypertension    Liver abscess    2012- [name] drained placed for awhile.   Long-term insulin  use (HCC) 01/26/2017   Mixed hyperlipidemia 02/25/2016   NSTEMI (non-ST elevated myocardial infarction) (HCC) 06/07/2020   Obesity 09/18/2011   PCOS (polycystic ovarian syndrome) 02/25/2016   Persistent microalbuminuria associated with type 2 diabetes mellitus (HCC) 02/25/2016   Polyneuropathy 05/02/2024   Renal failure 09/25/2011   Due to vancomycin , IV contrast, sepsis.     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  Was hospitalized, intubated, had cardiopulmonary failure.    Tobacco abuse    Type 2 diabetes mellitus with hyperglycemia (HCC) 02/25/2016   Unstable angina (HCC) 06/27/2020   Past Surgical History:  Procedure Laterality Date   ABDOMINAL AORTOGRAM W/LOWER EXTREMITY N/A 12/29/2021   Procedure: ABDOMINAL AORTOGRAM W/LOWER EXTREMITY;  Surgeon: Court Dorn JINNY, MD;  [facility];  Service: Cardiovascular;  Laterality: N/A;   ABDOMINAL AORTOGRAM W/LOWER EXTREMITY N/A 12/30/2023   Procedure: ABDOMINAL AORTOGRAM W/LOWER EXTREMITY;  Surgeon: Court Dorn JINNY, MD;  [facility];  Service: Cardiovascular;  Laterality: N/A;   APPENDECTOMY     CORONARY ARTERY BYPASS GRAFT  2017   [facility]   CRYOTHERAPY  90's- cervix   DILATION AND CURETTAGE OF UTERUS  1994   ENDARTERECTOMY FEMORAL Left 05/05/2024   Procedure: ENDARTERECTOMY, FEMORAL;  Surgeon: Lanis Fonda BRAVO, MD;  Location: [facility] OR;  Service: Vascular;  Laterality: Left;   LAPAROSCOPIC APPENDECTOMY N/A 01/28/2016   Procedure: APPENDECTOMY LAPAROSCOPIC;  Surgeon: Jina Nephew, MD;   [facility];  Service: General;  Laterality: N/A;   LEFT HEART CATH AND CORS/GRAFTS ANGIOGRAPHY N/A 06/07/2020   Procedure: LEFT HEART CATH AND CORS/GRAFTS ANGIOGRAPHY;  Surgeon: Jordan, Peter M, MD;  [facility];  Service: Cardiovascular;  Laterality: N/A;   PATCH ANGIOPLASTY Left 05/05/2024   Procedure: ANGIOPLASTY, USING GEORGE BIOLOGIC PATCH;  Surgeon: Lanis Fonda BRAVO, MD;  Location: [facility] OR;  Service: Vascular;  Laterality: Left;   PERIPHERAL VASCULAR INTERVENTION  12/29/2021   Procedure: PERIPHERAL VASCULAR INTERVENTION;  Surgeon: Court Dorn PARAS, MD;  [facility];  Service: Cardiovascular;;  Rt. Iliac   ROBOTIC ASSISTED SALPINGO OOPHERECTOMY Right 03/24/2016   Procedure: XI ROBOTIC ASSISTED LAPAROSCOPIC RIGHT SALPINGO OOPHORECTOMY;  Surgeon: Maurilio Ship, MD;  Location: [facility];  Service: Gynecology;  Laterality: Right;   TONSILLECTOMY     Family History  Problem Relation Age of Onset   CAD Mother    CAD Father    Diabetes Maternal Grandmother    Diabetes Paternal Grandfather    LABS: Lab Results  Component Value Date   CHOL 121 05/06/2024   HDL 24 (L) 05/06/2024   LDLCALC 72 05/06/2024   TRIG 127 05/06/2024   CHOLHDL 5.0 05/06/2024    Lab Results  Component Value Date   CREATININE 0.76 12/05/2024   BUN 11 12/05/2024   NA 136 12/05/2024   K 4.3 12/05/2024   CL 97 12/05/2024   CO2 24 12/05/2024   Estimated Creatinine Clearance: 98.3 mL/min (by C-G formula based on SCr of 0.76 mg/dL).      Component Value Date/Time   PROT 6.2 07/26/2024 1129   ALBUMIN  4.0 07/26/2024 1129   AST 13 07/26/2024 1129   ALT 9 07/26/2024 1129   ALKPHOS 79 07/26/2024 1129   BILITOT 0.4 07/26/2024 1129   BILIDIR 0.15 12/02/2021 0926   IBILI 0.1 (L) 06/07/2020 1000    Lab Results  Component Value Date   HGBA1C 10.3 (H) 04/26/2024    ASSESSMENT & PLAN:  ***  Recommend annual influenza and COVID vaccines.   Follow up: ***  [name],  PharmD [facility] [phone]

## 2024-12-08 ENCOUNTER — Other Ambulatory Visit (HOSPITAL_COMMUNITY): Payer: Self-pay

## 2024-12-08 ENCOUNTER — Other Ambulatory Visit: Payer: Self-pay

## 2024-12-08 ENCOUNTER — Ambulatory Visit: Attending: Vascular Surgery | Admitting: Pharmacist

## 2024-12-08 ENCOUNTER — Other Ambulatory Visit (HOSPITAL_BASED_OUTPATIENT_CLINIC_OR_DEPARTMENT_OTHER): Payer: Self-pay

## 2024-12-08 ENCOUNTER — Telehealth: Payer: Self-pay

## 2024-12-08 ENCOUNTER — Ambulatory Visit: Payer: Self-pay | Admitting: Cardiology

## 2024-12-08 VITALS — BP 188/100 | HR 98

## 2024-12-08 DIAGNOSIS — E1165 Type 2 diabetes mellitus with hyperglycemia: Secondary | ICD-10-CM

## 2024-12-08 DIAGNOSIS — I70229 Atherosclerosis of native arteries of extremities with rest pain, unspecified extremity: Secondary | ICD-10-CM | POA: Diagnosis not present

## 2024-12-08 DIAGNOSIS — I209 Angina pectoris, unspecified: Secondary | ICD-10-CM

## 2024-12-08 DIAGNOSIS — I10 Essential (primary) hypertension: Secondary | ICD-10-CM

## 2024-12-08 MED ORDER — CARVEDILOL 25 MG PO TABS
25.0000 mg | ORAL_TABLET | Freq: Two times a day (BID) | ORAL | 3 refills | Status: AC
  Filled 2024-12-08: qty 180, 90d supply, fill #0

## 2024-12-08 MED ORDER — SACUBITRIL-VALSARTAN 49-51 MG PO TABS
1.0000 | ORAL_TABLET | Freq: Two times a day (BID) | ORAL | 3 refills | Status: AC
  Filled 2024-12-08: qty 180, 90d supply, fill #0

## 2024-12-08 MED ORDER — METFORMIN HCL 500 MG PO TABS
ORAL_TABLET | ORAL | 1 refills | Status: AC
  Filled 2024-12-08: qty 180, 90d supply, fill #0

## 2024-12-08 MED ORDER — DEXCOM G6 SENSOR MISC
0 refills | Status: DC
  Filled 2024-12-08: qty 3, 30d supply, fill #0

## 2024-12-08 MED ORDER — FUROSEMIDE 40 MG PO TABS
40.0000 mg | ORAL_TABLET | Freq: Two times a day (BID) | ORAL | 3 refills | Status: AC
  Filled 2024-12-08: qty 90, 45d supply, fill #0

## 2024-12-08 MED ORDER — VARENICLINE TARTRATE 1 MG PO TABS
1.0000 mg | ORAL_TABLET | Freq: Two times a day (BID) | ORAL | 1 refills | Status: AC
  Filled 2024-12-08: qty 60, 30d supply, fill #0

## 2024-12-08 MED ORDER — CARVEDILOL 25 MG PO TABS: 25.0000 mg | ORAL_TABLET | Freq: Two times a day (BID) | ORAL | 3 refills | Status: AC

## 2024-12-08 MED ORDER — EMPAGLIFLOZIN 25 MG PO TABS
25.0000 mg | ORAL_TABLET | Freq: Every day | ORAL | 3 refills | Status: AC
  Filled 2024-12-08: qty 90, 90d supply, fill #0

## 2024-12-08 MED ORDER — CLOPIDOGREL BISULFATE 75 MG PO TABS
75.0000 mg | ORAL_TABLET | Freq: Every day | ORAL | 3 refills | Status: AC
  Filled 2024-12-08: qty 90, 90d supply, fill #0

## 2024-12-08 MED ORDER — NICOTINE POLACRILEX 2 MG MT GUM
2.0000 mg | CHEWING_GUM | OROMUCOSAL | 1 refills | Status: AC | PRN
  Filled 2024-12-08: qty 110, 10d supply, fill #0

## 2024-12-08 MED ORDER — GLIPIZIDE 10 MG PO TABS
ORAL_TABLET | ORAL | 1 refills | Status: AC
  Filled 2024-12-08: qty 180, 90d supply, fill #0

## 2024-12-08 MED ORDER — SPIRONOLACTONE 25 MG PO TABS
25.0000 mg | ORAL_TABLET | Freq: Every day | ORAL | 3 refills | Status: AC
  Filled 2024-12-08: qty 90, 90d supply, fill #0

## 2024-12-08 MED ORDER — ENTRESTO 24-26 MG PO TABS
1.0000 | ORAL_TABLET | Freq: Two times a day (BID) | ORAL | 3 refills | Status: DC
  Filled 2024-12-08: qty 180, 90d supply, fill #0

## 2024-12-08 MED ORDER — ASPIRIN EC 81 MG PO TBEC
81.0000 mg | DELAYED_RELEASE_TABLET | Freq: Every day | ORAL | 3 refills | Status: AC
  Filled 2024-12-08: qty 90, 90d supply, fill #0

## 2024-12-08 MED ORDER — SPIRONOLACTONE 25 MG PO TABS
25.0000 mg | ORAL_TABLET | Freq: Every day | ORAL | 1 refills | Status: AC
  Filled 2024-12-08: qty 30, 30d supply, fill #0

## 2024-12-08 MED ORDER — ATORVASTATIN CALCIUM 80 MG PO TABS
80.0000 mg | ORAL_TABLET | Freq: Every day | ORAL | 3 refills | Status: AC
  Filled 2024-12-08: qty 90, 90d supply, fill #0

## 2024-12-08 MED ORDER — VARENICLINE TARTRATE 0.5 MG PO TABS
ORAL_TABLET | ORAL | 0 refills | Status: AC
  Filled 2024-12-08: qty 11, 7d supply, fill #0

## 2024-12-08 MED ORDER — OZEMPIC (0.25 OR 0.5 MG/DOSE) 2 MG/3ML ~~LOC~~ SOPN
0.5000 mg | PEN_INJECTOR | SUBCUTANEOUS | 1 refills | Status: AC
  Filled 2024-12-08: qty 3, 28d supply, fill #0

## 2024-12-08 MED ORDER — ISOSORBIDE MONONITRATE ER 30 MG PO TB24
30.0000 mg | ORAL_TABLET | Freq: Every morning | ORAL | 3 refills | Status: AC
  Filled 2024-12-08: qty 90, 90d supply, fill #0

## 2024-12-08 MED ORDER — NITROGLYCERIN 0.4 MG SL SUBL
0.4000 mg | SUBLINGUAL_TABLET | SUBLINGUAL | 6 refills | Status: AC | PRN
  Filled 2024-12-08: qty 25, 7d supply, fill #0

## 2024-12-08 MED ORDER — SACUBITRIL-VALSARTAN 24-26 MG PO TABS
1.0000 | ORAL_TABLET | Freq: Two times a day (BID) | ORAL | 3 refills | Status: DC
  Filled 2024-12-08: qty 180, 90d supply, fill #0

## 2024-12-08 NOTE — Telephone Encounter (Signed)
 Left message on My Chart with lab results per Dr. Karry note. Routed to PCP

## 2024-12-08 NOTE — Addendum Note (Signed)
 Addended by: BONNELL IZETTA SAUNDERS on: 12/08/2024 02:25 PM   Modules accepted: Orders

## 2024-12-08 NOTE — Patient Instructions (Addendum)
-   Start taking Chantix 0.5 mg once daily for days 1-3, then increase to 0.5 mg twice daily for days 4-7, then increase to 1 mg twice daily  - You can use the nicotine  gum as needed for cravings - Please create a routine of filling your pill box each week to ensure you are consistently taking your medications. We will recheck your cholesterol once you have consistently been taking atorvastatin  80 mg daily. - I will assist with getting your prescriptions set up for delivery from [facility]. They will reach out to you via phone or my chart message to set up payment and confirm shipping

## 2024-12-11 ENCOUNTER — Other Ambulatory Visit (HOSPITAL_COMMUNITY): Payer: Self-pay

## 2024-12-11 ENCOUNTER — Other Ambulatory Visit: Payer: Self-pay

## 2024-12-19 ENCOUNTER — Ambulatory Visit (HOSPITAL_COMMUNITY)

## 2024-12-19 ENCOUNTER — Ambulatory Visit

## 2024-12-22 ENCOUNTER — Other Ambulatory Visit (HOSPITAL_COMMUNITY): Payer: Self-pay

## 2024-12-27 ENCOUNTER — Other Ambulatory Visit (HOSPITAL_COMMUNITY): Payer: Self-pay

## 2024-12-29 ENCOUNTER — Other Ambulatory Visit (HOSPITAL_COMMUNITY): Payer: Self-pay

## 2024-12-29 MED ORDER — DEXCOM G7 SENSOR MISC
0 refills | Status: AC
  Filled 2024-12-29: qty 6, 60d supply, fill #0

## 2025-01-02 ENCOUNTER — Other Ambulatory Visit (HOSPITAL_COMMUNITY): Payer: Self-pay

## 2025-01-03 NOTE — Progress Notes (Unsigned)
 " [facility] Pharmacist Note  Name: [name]  MRN: [id number]  DOB: 1976-09-06  Sex: female PCP: Duwaine Burnard Amble, NP {CPP Referral Provider:[id number]:::1}  HISTORY OF PRESENT ILLNESS: [name] is a 49 y.o. female with PMH symptomatic PAD s/p iliofemoral endarterectomy for occluded left common femoral artery in May 2025, stroke, CAD s/p CABG in 2017 who presents for medication management for cardiovascular risk reduction. *** Last seen in [facility] 12/08/24 at which time patient reported difficulty with medication adherence. Patient was agreeable to transferring medications to [facility] for mail order as it is difficult for her to get to the pharmacy to pick up. After discussion of cardiovascular risk factors, patient was amenable to smoking cessation and was started on varenicline  and nicotine  2 mg gum with a set quit date of 12/21/24.  Today, ***  Dyslipidemia/ASCVD  Current lipid-lowering medications: atorvastatin  80 mg daily  Previously tried medications/intolerances: N/A  Rx affordability and access: Medications are $4/month  Current antiplatelets/antithrombotics: Plavix  and aspirin    Tobacco Abuse Current medications: varenicline  Rx affordability and access: Chantix  is $4/month   Tobacco Use History:   Age when started using tobacco on a daily basis: ***  Number of cigarettes per day: ***   Smokes first cigarette *** minutes after waking  {Does/does not:3044014::Does,Does not} wake at night to smoke  Triggers to smoke: ***   Quit Attempt History:   Most recent quit attempt: ***  Longest time ever been tobacco free: ***  Methods tried in the past: {CHL AMB [facility] MEDICATIONS FOR SMOKING CESSATION:20759}  Rates importance of quitting tobacco on 1-10 scale of ***  Rates readiness of quitting tobacco on 1-10 scale of ***  Rates confidence of quitting tobacco on 1-10 scale of ***  Motivators to quit: ***  Barriers to quit: {smoking cessation barriers:18118}     Current dietary habits:   Supper: salad with chicken or shrimp, pinto beans  Snacks: peanut butter crackers  Drinks: was drinking mountain dew every day, but has switched to mountain dew zero and water   Current physical activity: ***   Patient {ACTION; IS/IS NOT:21021397} up to date on annual influenza vaccine.  Patient {ACTION; IS/IS NOT:21021397} up to date on COVID vaccines.   Past Medical History:  Diagnosis Date   Abnormal stress test 08/20/2016   Formatting of this note might be different from the original. Added automatically from request for surgery [id number]   Acute appendicitis 01/28/2016   Acute appendicitis with localized peritonitis 01/28/2016   Acute non-ST segment elevation myocardial infarction (HCC) 03/17/2021   Anemia    Angina pectoris 06/27/2020   Anxiety disorder 03/17/2021   Atherosclerosis of artery of extremity with rest pain (HCC) 05/05/2024   Calculus of gallbladder 06/05/2016   Cardiomyopathy (HCC)    postpartem cardiomyopathy- 2012- has since been released by cardiology   Claudication in peripheral vascular disease 10/29/2021   Peripheral arterial disease     Coronary artery disease 06/27/2020   COVID 10/14/2020   c/o cough and chest pressure    CVA (cerebral vascular accident) [facility])    Depression    Dizziness 08/28/2022   Dyslipidemia 06/27/2020   Dyspnea on exertion 06/27/2020   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 LIMA to LAD, SVG to PDA in September 2017 [facility]   Hyperglycemia due to type 2 diabetes mellitus (HCC) 02/25/2016  Hypertension    Liver abscess    2012- Leonce Birk drained placed for awhile.   Long-term insulin  use (HCC) 01/26/2017   Mixed hyperlipidemia 02/25/2016   NSTEMI (non-ST elevated myocardial infarction) (HCC) 06/07/2020   Obesity 09/18/2011   PCOS  (polycystic ovarian syndrome) 02/25/2016   Persistent microalbuminuria associated with type 2 diabetes mellitus (HCC) 02/25/2016   Polyneuropathy 05/02/2024   Renal failure 09/25/2011   Due to vancomycin , IV contrast, sepsis.     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  Was hospitalized, intubated, had cardiopulmonary failure.    Tobacco abuse    Type 2 diabetes mellitus with hyperglycemia (HCC) 02/25/2016   Unstable angina (HCC) 06/27/2020   Past Surgical History:  Procedure Laterality Date   ABDOMINAL AORTOGRAM W/LOWER EXTREMITY N/A 12/29/2021   Procedure: ABDOMINAL AORTOGRAM W/LOWER EXTREMITY;  Surgeon: Court Dorn PARAS, MD;  [facility];  Service: Cardiovascular;  Laterality: N/A;   ABDOMINAL AORTOGRAM W/LOWER EXTREMITY N/A 12/30/2023   Procedure: ABDOMINAL AORTOGRAM W/LOWER EXTREMITY;  Surgeon: Court Dorn PARAS, MD;  [facility]: [facility];  Service: Cardiovascular;  Laterality: N/A;   APPENDECTOMY     CORONARY ARTERY BYPASS GRAFT  2017   [facility]   CRYOTHERAPY     90's- cervix   DILATION AND CURETTAGE OF UTERUS  1994   ENDARTERECTOMY FEMORAL Left 05/05/2024   Procedure: ENDARTERECTOMY, FEMORAL;  Surgeon: Lanis Fonda BRAVO, MD;  Location: [facility] OR;  Service: Vascular;  Laterality: Left;   LAPAROSCOPIC APPENDECTOMY N/A 01/28/2016   Procedure: APPENDECTOMY LAPAROSCOPIC;  Surgeon: Jina Nephew, MD;  Location: [facility] OR;  Service: General;  Laterality: N/A;   LEFT HEART CATH AND CORS/GRAFTS ANGIOGRAPHY N/A 06/07/2020   Procedure: LEFT HEART CATH AND CORS/GRAFTS ANGIOGRAPHY;  Surgeon: Jordan, Peter M, MD;  [facility];  Service: Cardiovascular;  Laterality: N/A;   PATCH ANGIOPLASTY Left 05/05/2024    Procedure: ANGIOPLASTY, USING GEORGE BIOLOGIC PATCH;  Surgeon: Lanis Fonda BRAVO, MD;  Location: [facility] OR;  Service: Vascular;  Laterality: Left;   PERIPHERAL VASCULAR INTERVENTION  12/29/2021   Procedure: PERIPHERAL VASCULAR INTERVENTION;  Surgeon: Court Dorn PARAS, MD;  [facility];  Service: Cardiovascular;;  Rt. Iliac   ROBOTIC ASSISTED SALPINGO OOPHERECTOMY Right 03/24/2016   Procedure: XI ROBOTIC ASSISTED LAPAROSCOPIC RIGHT SALPINGO OOPHORECTOMY;  Surgeon: Maurilio Ship, MD;  Location: [facility] ORS;  Service: Gynecology;  Laterality: Right;   TONSILLECTOMY     Family History  Problem Relation Age of Onset   CAD Mother    CAD Father    Diabetes Maternal Grandmother    Diabetes Paternal Grandfather    LABS: Lab Results  Component Value Date   CHOL 121 05/06/2024   HDL 24 (L) 05/06/2024   LDLCALC 72 05/06/2024   TRIG 127 05/06/2024   CHOLHDL 5.0 05/06/2024    Lab Results  Component Value Date   CREATININE 0.76 12/05/2024   BUN 11 12/05/2024   NA 136 12/05/2024   K 4.3 12/05/2024   CL 97 12/05/2024   CO2 24 12/05/2024   CrCl cannot be calculated (Patient's most recent lab result is older than the maximum 21 days allowed.).      Component Value Date/Time   PROT 6.2 07/26/2024 1129  ALBUMIN  4.0 07/26/2024 1129   AST 13 07/26/2024 1129   ALT 9 07/26/2024 1129   ALKPHOS 79 07/26/2024 1129   BILITOT 0.4 07/26/2024 1129   BILIDIR 0.15 12/02/2021 0926   IBILI 0.1 (L) 06/07/2020 1000    Lab Results  Component Value Date   HGBA1C 10.3 (H) 04/26/2024    ASSESSMENT & PLAN:  Dyslipidemia LDL {Desc; above/below:16086} goal <*** mg/dL.   ***  Counseled patient on treatment, including efficacy, dosing, administration, possible adverse effects, and anticipated cost.  Reviewed long-term complications of uncontrolled cholesterol.  Reviewed goals for cholesterol readings with patient.  Reviewed dietary and lifestyle modifications to improve cholesterol.  Repeat lipid  panel in 4-12 weeks.   Tobacco Abuse - Currently {CHL Controlled/[phone]} - Provided motivational interviewing to assess tobacco use and strategies for reduction - Provided information on 1 800 QUIT NOW support program - {Tobacco Treatment:26423} - ***  Recommend annual influenza and COVID vaccines.   Follow up: ***  Jenkins Graces, PharmD PGY1 Pharmacy Resident   "

## 2025-01-04 ENCOUNTER — Ambulatory Visit: Admitting: Pharmacist

## 2025-01-08 NOTE — Progress Notes (Unsigned)
 " [facility] Pharmacist Note  Name: [name]  MRN: [id number]  DOB: [birth year]  Sex: female PCP: Duwaine Burnard Amble, NP {CPP Referral Provider:[id number]:::1}  HISTORY OF PRESENT ILLNESS: [name] is a 49 y.o. female with PMH symptomatic PAD s/p iliofemoral endarterectomy for occluded left common femoral artery in May 2025, stroke, CAD s/p CABG in 2017 who presents for medication management for cardiovascular risk reduction. *** Last seen in [facility] 12/08/24 at which time patient reported difficulty with medication adherence. Patient was agreeable to transferring medications to [facility] for mail order as it is difficult for her to get to the pharmacy to pick up. After discussion of cardiovascular risk factors, patient was amenable to smoking cessation and was started on varenicline  and nicotine  2 mg gum with a set quit date of 12/21/24.  Today, ***  Dyslipidemia/ASCVD  Current lipid-lowering medications: atorvastatin  80 mg daily  Previously tried medications/intolerances: N/A  Rx affordability and access: Medications are $4/month  Current antiplatelets/antithrombotics: Plavix  and aspirin    Tobacco Abuse Current medications: varenicline  Rx affordability and access: Chantix  is $4/month   Tobacco Use History:   Age when started using tobacco on a daily basis: ***  Number of cigarettes per day: ***   Smokes first cigarette *** minutes after waking  {Does/does not:3044014::Does,Does not} wake at night to smoke  Triggers to smoke: ***   Quit Attempt History:   Most recent quit attempt: ***  Longest time ever been tobacco free: ***  Methods tried in the past: {CHL AMB [facility] MEDICATIONS FOR SMOKING CESSATION:20759}  Rates importance of quitting tobacco on 1-10 scale of ***  Rates readiness of quitting tobacco on 1-10 scale of ***  Rates confidence of quitting tobacco on 1-10 scale of ***  Motivators to quit: ***  Barriers to quit: {smoking cessation barriers:18118}     Current dietary habits:   Supper: salad with chicken or shrimp, pinto beans  Snacks: peanut butter crackers  Drinks: was drinking mountain dew every day, but has switched to mountain dew zero and water   Current physical activity: ***   Patient {ACTION; IS/IS NOT:21021397} up to date on annual influenza vaccine.  Patient {ACTION; IS/IS NOT:21021397} up to date on COVID vaccines.   Past Medical History:  Diagnosis Date   Abnormal stress test 08/20/2016   Formatting of this note might be different from the original. Added automatically from request for surgery [id number]   Acute appendicitis 01/28/2016   Acute appendicitis with localized peritonitis 01/28/2016   Acute non-ST segment elevation myocardial infarction (HCC) 03/17/2021   Anemia    Angina pectoris 06/27/2020   Anxiety disorder 03/17/2021   Atherosclerosis of artery of extremity with rest pain (HCC) 05/05/2024   Calculus of gallbladder 06/05/2016   Cardiomyopathy (HCC)    postpartem cardiomyopathy- 2012- has since been released by cardiology   Claudication in peripheral vascular disease 10/29/2021   Peripheral arterial disease     Coronary artery disease 06/27/2020   COVID 10/14/2020   c/o cough and chest pressure    [facility]A [facility] vascular accident) [facility])    Depression    Dizziness 08/28/2022   Dyslipidemia 06/27/2020   Dyspnea on exertion 06/27/2020   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 LIMA to LAD, SVG to PDA in September 2017 [facility]   Hyperglycemia due to type 2 diabetes mellitus (HCC) 02/25/2016  Hypertension    Liver abscess    2012- Leonce Birk drained placed for awhile.   Long-term insulin  use (HCC) 01/26/2017   Mixed hyperlipidemia 02/25/2016   NSTEMI (non-ST elevated myocardial infarction) (HCC) 06/07/2020   Obesity 09/18/2011   PCOS  (polycystic ovarian syndrome) 02/25/2016   Persistent microalbuminuria associated with type 2 diabetes mellitus (HCC) 02/25/2016   Polyneuropathy 05/02/2024   Renal failure 09/25/2011   Due to vancomycin , IV contrast, sepsis.     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  Was hospitalized, intubated, had cardiopulmonary failure.    Tobacco abuse    Type 2 diabetes mellitus with hyperglycemia (HCC) 02/25/2016   Unstable angina (HCC) 06/27/2020   Past Surgical History:  Procedure Laterality Date   ABDOMINAL AORTOGRAM W/LOWER EXTREMITY N/A 12/29/2021   Procedure: ABDOMINAL AORTOGRAM W/LOWER EXTREMITY;  Surgeon: Court Dorn PARAS, MD;  [facility];  Service: Cardiovascular;  Laterality: N/A;   ABDOMINAL AORTOGRAM W/LOWER EXTREMITY N/A 12/30/2023   Procedure: ABDOMINAL AORTOGRAM W/LOWER EXTREMITY;  Surgeon: Court Dorn PARAS, MD;  [facility];  Service: Cardiovascular;  Laterality: N/A;   APPENDECTOMY     CORONARY ARTERY BYPASS GRAFT  2017   [facility]   CRYOTHERAPY     90's- cervix   DILATION AND CURETTAGE OF UTERUS  1994   ENDARTERECTOMY FEMORAL Left 05/05/2024   Procedure: ENDARTERECTOMY, FEMORAL;  Surgeon: Lanis Fonda BRAVO, MD;  Location: [facility] OR;  Service: Vascular;  Laterality: Left;   LAPAROSCOPIC APPENDECTOMY N/A 01/28/2016   Procedure: APPENDECTOMY LAPAROSCOPIC;  Surgeon: Jina Nephew, MD;  Location: [facility] OR;  Service: General;  Laterality: N/A;   LEFT HEART CATH AND CORS/GRAFTS ANGIOGRAPHY N/A 06/07/2020   Procedure: LEFT HEART CATH AND CORS/GRAFTS ANGIOGRAPHY;  Surgeon: Jordan, Peter M, MD;  [facility];  Service: Cardiovascular;  Laterality: N/A;   PATCH ANGIOPLASTY Left 05/05/2024    Procedure: ANGIOPLASTY, USING GEORGE BIOLOGIC PATCH;  Surgeon: Lanis Fonda BRAVO, MD;  Location[facility] OR;  Service: Vascular;  Laterality: Left;   PERIPHERAL VASCULAR INTERVENTION  12/29/2021   Procedure: PERIPHERAL VASCULAR INTERVENTION;  Surgeon: Court Dorn PARAS, MD;  [facility];  Service: Cardiovascular;;  Rt. Iliac   ROBOTIC ASSISTED SALPINGO OOPHERECTOMY Right 03/24/2016   Procedure: XI ROBOTIC ASSISTED LAPAROSCOPIC RIGHT SALPINGO OOPHORECTOMY;  Surgeon: Maurilio Ship, MD;  Location: [facility]S;  Service: Gynecology;  Laterality: Right;   TONSILLECTOMY     Family History  Problem Relation Age of Onset   CAD Mother    CAD Father    Diabetes Maternal Grandmother    Diabetes Paternal Grandfather    LABS: Lab Results  Component Value Date   CHOL 121 05/06/2024   HDL 24 (L) 05/06/2024   LDLCALC 72 05/06/2024   TRIG 127 05/06/2024   CHOLHDL 5.0 05/06/2024    Lab Results  Component Value Date   CREATININE 0.76 12/05/2024   BUN 11 12/05/2024   NA 136 12/05/2024   K 4.3 12/05/2024   CL 97 12/05/2024   CO2 24 12/05/2024   CrCl cannot be calculated (Patient's most recent lab result is older than the maximum 21 days allowed.).      Component Value Date/Time   PROT 6.2 07/26/2024 1129  ALBUMIN  4.0 07/26/2024 1129   AST 13 07/26/2024 1129   ALT 9 07/26/2024 1129   ALKPHOS 79 07/26/2024 1129   BILITOT 0.4 07/26/2024 1129   BILIDIR 0.15 12/02/2021 0926   IBILI 0.1 (L) 06/07/2020 1000    Lab Results  Component Value Date   HGBA1C 10.3 (H) 04/26/2024    ASSESSMENT & PLAN:  Dyslipidemia LDL {Desc; above/below:16086} goal <*** mg/dL.   ***  Counseled patient on treatment, including efficacy, dosing, administration, possible adverse effects, and anticipated cost.  Reviewed long-term complications of uncontrolled cholesterol.  Reviewed goals for cholesterol readings with patient.  Reviewed dietary and lifestyle modifications to improve cholesterol.  Repeat lipid  panel in 4-12 weeks.   Tobacco Abuse {tobtx:33657} Counseled patient on treatment, including efficacy, dosing, administration, possible adverse effects, and anticipated cost.  Provided motivational interviewing to assess tobacco use and strategies for reduction.  Provided information on 1-800-QUIT NOW support program.    Recommend annual influenza and COVID vaccines.   Follow up: ***  Izetta Henry, PharmD, CPP [facility] [phone]   "

## 2025-01-09 ENCOUNTER — Other Ambulatory Visit (HOSPITAL_COMMUNITY): Payer: Self-pay

## 2025-01-09 ENCOUNTER — Ambulatory Visit: Attending: Vascular Surgery | Admitting: Pharmacist

## 2025-01-09 ENCOUNTER — Other Ambulatory Visit: Payer: Self-pay

## 2025-01-09 VITALS — BP 134/82 | HR 82

## 2025-01-09 DIAGNOSIS — I70229 Atherosclerosis of native arteries of extremities with rest pain, unspecified extremity: Secondary | ICD-10-CM | POA: Diagnosis not present

## 2025-01-09 MED ORDER — VARENICLINE TARTRATE 0.5 MG PO TABS
ORAL_TABLET | ORAL | 0 refills | Status: AC
  Filled 2025-01-09: qty 11, 7d supply, fill #0

## 2025-01-09 NOTE — Patient Instructions (Signed)
" -   Plan to restart Chantix  and work on decreasing the number of cigarettes you smoke in a day - We will plan to recheck your cholesterol next visit so please arrive fasting for the appointment if possible "

## 2025-01-19 ENCOUNTER — Other Ambulatory Visit (HOSPITAL_COMMUNITY): Payer: Self-pay

## 2025-02-08 ENCOUNTER — Ambulatory Visit: Admitting: Vascular Surgery

## 2025-02-08 ENCOUNTER — Ambulatory Visit (HOSPITAL_COMMUNITY)

## 2025-02-13 ENCOUNTER — Ambulatory Visit: Admitting: Pharmacist

## 2025-04-17 ENCOUNTER — Ambulatory Visit: Admitting: Adult Health
